# Patient Record
Sex: Female | Born: 1940 | Race: Black or African American | Hispanic: No | Marital: Married | Smoking: Former smoker
Health system: Southern US, Community
[De-identification: ages and names within clinical notes are randomized; demographics above are authoritative.]

## PROBLEM LIST (undated history)

## (undated) DIAGNOSIS — K219 Gastro-esophageal reflux disease without esophagitis: Secondary | ICD-10-CM

## (undated) DIAGNOSIS — E785 Hyperlipidemia, unspecified: Secondary | ICD-10-CM

## (undated) DIAGNOSIS — I10 Essential (primary) hypertension: Secondary | ICD-10-CM

## (undated) DIAGNOSIS — E119 Type 2 diabetes mellitus without complications: Secondary | ICD-10-CM

## (undated) DIAGNOSIS — I639 Cerebral infarction, unspecified: Secondary | ICD-10-CM

## (undated) HISTORY — DX: Essential (primary) hypertension: I10

## (undated) HISTORY — DX: Type 2 diabetes mellitus without complications: E11.9

## (undated) HISTORY — DX: Hyperlipidemia, unspecified: E78.5

## (undated) HISTORY — DX: Gastro-esophageal reflux disease without esophagitis: K21.9

---

## 1984-03-09 HISTORY — PX: CHOLECYSTECTOMY: SHX55

## 1994-06-04 DIAGNOSIS — I10 Essential (primary) hypertension: Secondary | ICD-10-CM | POA: Insufficient documentation

## 2001-03-09 HISTORY — PX: BACK SURGERY: SHX140

## 2003-07-19 ENCOUNTER — Other Ambulatory Visit: Payer: Self-pay

## 2004-04-05 ENCOUNTER — Ambulatory Visit: Payer: Self-pay | Admitting: Internal Medicine

## 2004-04-05 LAB — HM COLONOSCOPY: HM Colonoscopy: NORMAL

## 2004-12-22 ENCOUNTER — Ambulatory Visit: Payer: Self-pay | Admitting: Family Medicine

## 2006-01-07 ENCOUNTER — Ambulatory Visit: Payer: Self-pay | Admitting: Family Medicine

## 2006-01-15 ENCOUNTER — Ambulatory Visit: Payer: Self-pay | Admitting: Family Medicine

## 2007-07-11 ENCOUNTER — Ambulatory Visit: Payer: Self-pay | Admitting: Family Medicine

## 2008-03-09 HISTORY — PX: CERVICAL BIOPSY  W/ LOOP ELECTRODE EXCISION: SUR135

## 2008-09-19 ENCOUNTER — Ambulatory Visit: Payer: Self-pay | Admitting: Family Medicine

## 2009-07-15 ENCOUNTER — Ambulatory Visit: Payer: Self-pay | Admitting: Obstetrics & Gynecology

## 2009-07-16 ENCOUNTER — Ambulatory Visit: Payer: Self-pay | Admitting: Obstetrics & Gynecology

## 2009-07-25 ENCOUNTER — Ambulatory Visit: Payer: Self-pay | Admitting: Obstetrics & Gynecology

## 2009-10-16 ENCOUNTER — Ambulatory Visit: Payer: Self-pay | Admitting: Family Medicine

## 2010-08-17 ENCOUNTER — Emergency Department: Payer: Self-pay | Admitting: Emergency Medicine

## 2010-12-22 ENCOUNTER — Ambulatory Visit: Payer: Self-pay | Admitting: Family Medicine

## 2011-02-25 ENCOUNTER — Ambulatory Visit: Payer: Self-pay | Admitting: Family Medicine

## 2011-03-10 ENCOUNTER — Ambulatory Visit: Payer: Self-pay | Admitting: Family Medicine

## 2011-04-10 ENCOUNTER — Ambulatory Visit: Payer: Self-pay | Admitting: Family Medicine

## 2011-05-08 ENCOUNTER — Ambulatory Visit: Payer: Self-pay | Admitting: Family Medicine

## 2011-12-23 ENCOUNTER — Ambulatory Visit: Payer: Self-pay | Admitting: Family Medicine

## 2012-07-25 ENCOUNTER — Ambulatory Visit: Payer: Self-pay | Admitting: Family Medicine

## 2012-07-25 LAB — HM DEXA SCAN: HM DEXA SCAN: NORMAL

## 2013-01-25 ENCOUNTER — Ambulatory Visit: Payer: Self-pay | Admitting: Family Medicine

## 2013-09-20 LAB — CBC AND DIFFERENTIAL
HEMATOCRIT: 39 % (ref 36–46)
HEMOGLOBIN: 12.5 g/dL (ref 12.0–16.0)
PLATELETS: 171 10*3/uL (ref 150–399)
WBC: 4.8 10*3/mL

## 2013-09-20 LAB — LIPID PANEL
Cholesterol: 161 mg/dL (ref 0–200)
HDL: 71 mg/dL — AB (ref 35–70)
LDL CALC: 161 mg/dL
Triglycerides: 175 mg/dL — AB (ref 40–160)

## 2013-09-20 LAB — TSH: TSH: 4.21 u[IU]/mL (ref 0.41–5.90)

## 2013-09-20 LAB — BASIC METABOLIC PANEL
BUN: 14 mg/dL (ref 4–21)
Creatinine: 0.9 mg/dL (ref 0.5–1.1)
Glucose: 148 mg/dL
Potassium: 5 mmol/L (ref 3.4–5.3)
SODIUM: 142 mmol/L (ref 137–147)

## 2013-09-20 LAB — HEPATIC FUNCTION PANEL
ALT: 30 U/L (ref 7–35)
AST: 33 U/L (ref 13–35)

## 2013-10-08 ENCOUNTER — Emergency Department: Payer: Self-pay | Admitting: Emergency Medicine

## 2013-10-08 LAB — URINALYSIS, COMPLETE
BILIRUBIN, UR: NEGATIVE
Bacteria: NONE SEEN
Blood: NEGATIVE
Glucose,UR: NEGATIVE mg/dL (ref 0–75)
KETONE: NEGATIVE
Nitrite: NEGATIVE
Ph: 5 (ref 4.5–8.0)
Protein: NEGATIVE
Specific Gravity: 1.02 (ref 1.003–1.030)
Squamous Epithelial: 2
WBC UR: 6 /HPF (ref 0–5)

## 2013-10-08 LAB — COMPREHENSIVE METABOLIC PANEL
ALBUMIN: 3.5 g/dL (ref 3.4–5.0)
ANION GAP: 9 (ref 7–16)
Alkaline Phosphatase: 47 U/L
BUN: 11 mg/dL (ref 7–18)
Bilirubin,Total: 0.5 mg/dL (ref 0.2–1.0)
CHLORIDE: 100 mmol/L (ref 98–107)
CO2: 27 mmol/L (ref 21–32)
Calcium, Total: 9.2 mg/dL (ref 8.5–10.1)
Creatinine: 0.96 mg/dL (ref 0.60–1.30)
EGFR (African American): >60
GFR CALC NON AF AMER: 60 — AB
Glucose: 176 mg/dL — ABNORMAL HIGH (ref 65–99)
Osmolality: 276 (ref 275–301)
Potassium: 3.8 mmol/L (ref 3.5–5.1)
SGOT(AST): 28 U/L (ref 15–37)
SGPT (ALT): 36 U/L
Sodium: 136 mmol/L (ref 136–145)
Total Protein: 8 g/dL (ref 6.4–8.2)

## 2013-10-08 LAB — CBC WITH DIFFERENTIAL/PLATELET
Basophil #: 0 10*3/uL (ref 0.0–0.1)
Basophil %: 0.4 %
Eosinophil #: 0.1 10*3/uL (ref 0.0–0.7)
Eosinophil %: 1.3 %
HCT: 38.1 % (ref 35.0–47.0)
HGB: 12.3 g/dL (ref 12.0–16.0)
LYMPHS ABS: 1 10*3/uL (ref 1.0–3.6)
Lymphocyte %: 15.5 %
MCH: 29.3 pg (ref 26.0–34.0)
MCHC: 32.4 g/dL (ref 32.0–36.0)
MCV: 91 fL (ref 80–100)
MONO ABS: 1 x10 3/mm — AB (ref 0.2–0.9)
MONOS PCT: 15.4 %
NEUTROS ABS: 4.2 10*3/uL (ref 1.4–6.5)
Neutrophil %: 67.4 %
Platelet: 166 10*3/uL (ref 150–440)
RBC: 4.21 10*6/uL (ref 3.80–5.20)
RDW: 12.5 % (ref 11.5–14.5)
WBC: 6.2 10*3/uL (ref 3.6–11.0)

## 2013-10-08 LAB — PRO B NATRIURETIC PEPTIDE: B-TYPE NATIURETIC PEPTID: 292 pg/mL — AB (ref 0–125)

## 2013-10-08 LAB — TROPONIN I: Troponin-I: <0.02 ng/mL

## 2014-02-06 ENCOUNTER — Ambulatory Visit: Payer: Self-pay

## 2014-03-14 ENCOUNTER — Ambulatory Visit: Payer: Self-pay | Admitting: Family Medicine

## 2014-03-14 LAB — HM MAMMOGRAPHY

## 2014-05-23 LAB — HEMOGLOBIN A1C: HEMOGLOBIN A1C: 6.6 % — AB (ref 4.0–6.0)

## 2014-07-18 DIAGNOSIS — E559 Vitamin D deficiency, unspecified: Secondary | ICD-10-CM | POA: Insufficient documentation

## 2014-07-18 DIAGNOSIS — R6889 Other general symptoms and signs: Secondary | ICD-10-CM | POA: Insufficient documentation

## 2014-07-18 DIAGNOSIS — K219 Gastro-esophageal reflux disease without esophagitis: Secondary | ICD-10-CM | POA: Insufficient documentation

## 2014-07-18 DIAGNOSIS — J309 Allergic rhinitis, unspecified: Secondary | ICD-10-CM | POA: Insufficient documentation

## 2014-07-18 DIAGNOSIS — E538 Deficiency of other specified B group vitamins: Secondary | ICD-10-CM | POA: Insufficient documentation

## 2014-07-18 DIAGNOSIS — E7800 Pure hypercholesterolemia, unspecified: Secondary | ICD-10-CM | POA: Insufficient documentation

## 2014-07-18 DIAGNOSIS — IMO0002 Reserved for concepts with insufficient information to code with codable children: Secondary | ICD-10-CM | POA: Insufficient documentation

## 2014-07-18 DIAGNOSIS — K635 Polyp of colon: Secondary | ICD-10-CM | POA: Insufficient documentation

## 2014-08-20 ENCOUNTER — Other Ambulatory Visit: Payer: Self-pay

## 2014-08-20 DIAGNOSIS — E119 Type 2 diabetes mellitus without complications: Secondary | ICD-10-CM

## 2014-08-20 MED ORDER — TRUE METRIX AIR GLUCOSE METER DEVI: 1.0000 | Freq: Every day | Status: AC

## 2014-08-20 MED ORDER — TRUEPLUS LANCETS 28G MISC: Status: AC

## 2014-08-20 MED ORDER — GLUCOSE BLOOD VI STRP: ORAL_STRIP | Status: DC

## 2014-09-07 ENCOUNTER — Encounter: Payer: Self-pay | Admitting: Physician Assistant

## 2014-09-07 ENCOUNTER — Ambulatory Visit (INDEPENDENT_AMBULATORY_CARE_PROVIDER_SITE_OTHER): Payer: Medicare PPO | Admitting: Physician Assistant

## 2014-09-07 VITALS — BP 122/64 | HR 58 | Temp 98.5°F | Resp 16 | Ht 67.0 in | Wt 222.0 lb

## 2014-09-07 DIAGNOSIS — N3000 Acute cystitis without hematuria: Secondary | ICD-10-CM | POA: Diagnosis not present

## 2014-09-07 LAB — POCT URINALYSIS DIPSTICK
BILIRUBIN UA: NEGATIVE
Blood, UA: NEGATIVE
Glucose, UA: NEGATIVE
KETONES UA: NEGATIVE
Nitrite, UA: NEGATIVE
PROTEIN UA: NEGATIVE
SPEC GRAV UA: 1.015
Urobilinogen, UA: 0.2
pH, UA: 6

## 2014-09-07 NOTE — Patient Instructions (Signed)

## 2014-09-07 NOTE — Progress Notes (Signed)
Patient ID: Kathleen Campos, female   DOB: 1940-04-02, 74 y.o.   MRN: [id number]       [name] Female    DOB: 01/08/1941   74 y.o.   MRN: [id number] Visit Date: 09/07/2014  Today's Provider: Mar Daring, PA-C   Chief Complaint  Patient presents with  . Follow-up     after been seen at Urgent Care   Subjective:    HPI Patient was seen at Urgent Care on JUne 22nd for UTI. She was started on Cephalexin at that time which she took but then this week that doctor called and advised patient to switch to Septra DS and she has been taking it for 2 days now. She was advised to have her potassium level checked on this medication. She is not sure why but wanted to follow up on this today unless it is too early to have this re checked. She feels a little better today she still has some nausea and some lower abdomen pressure. No fever. No vomiting.   Previous Medications   ASPIRIN 81 MG TABLET    ASPIRIN, 81MG  (Oral Tablet Delayed Release)  1 Every Day for 0 days  Quantity: 0.00;  Refills: 0   Ordered :10-Dec-2011  Edmonia James ;  Started 04-October-2007 Active   BLOOD GLUCOSE MONITORING SUPPL (TRUE METRIX AIR GLUCOSE METER) DEVI    1 Device by Does not apply route daily. To check blood sugar once a day.   CALCIUM-VITAMIN D (OSCAL WITH D) 500-200 MG-UNIT PER TABLET    OSCAL 500/200 D-3, 500-200MG -UNIT (Oral Tablet)  1 Twice Daily for 0 days  Quantity: 0.00;  Refills: 0   Ordered :10-Dec-2011  Edmonia James ;  Started 04-October-2007 Active   CHOLECALCIFEROL 1000 UNITS CAPSULE    Take by mouth.   CYANOCOBALAMIN 1000 MCG TABLET    Take by mouth.   GLIPIZIDE (GLUCOTROL XL) 10 MG 24 HR TABLET    Take by mouth.   GLUCOSE BLOOD TEST STRIP    To check blood sugar once a day.   HYDROCHLOROTHIAZIDE (HYDRODIURIL) 12.5 MG TABLET    Take by mouth.   METFORMIN (GLUCOPHAGE) 1000 MG TABLET    Take by mouth.   NAPROXEN SODIUM (ANAPROX) 220 MG TABLET    Take by mouth.   OMEPRAZOLE  (PRILOSEC) 20 MG CAPSULE    Take by mouth.   PIOGLITAZONE (ACTOS) 30 MG TABLET    Take by mouth.   POTASSIUM CHLORIDE SA (K-DUR,KLOR-CON) 20 MEQ TABLET    Take by mouth.   SIMVASTATIN (ZOCOR) 20 MG TABLET    Take by mouth.   SULFAMETHOXAZOLE-TRIMETHOPRIM (BACTRIM DS,SEPTRA DS) 800-160 MG PER TABLET    Take by mouth.   TRUEPLUS LANCETS 28G MISC    To check blood sugar once a day.   VALSARTAN (DIOVAN) 80 MG TABLET    Take by mouth.    Review of Systems  Constitutional: Negative for fever, chills and fatigue.  Respiratory: Negative for apnea, cough, choking, chest tightness and shortness of breath.   Cardiovascular: Negative for chest pain, palpitations and leg swelling.  Gastrointestinal: Positive for nausea and abdominal pain (lower abdomen). Negative for diarrhea and constipation.  Musculoskeletal: Positive for back pain (sometimes).    History  Substance Use Topics  . Smoking status: Former Smoker    Quit date: 07/18/1978  . Smokeless tobacco: Never Used  . Alcohol Use: No   Objective:   BP 122/64 mmHg  Pulse 58  Temp(Src) 98.5  F (36.9 C)  Resp 16  Ht 5\' 7"  (1.702 m)  Wt 222 lb (100.699 kg)  BMI 34.76 kg/m2  Physical Exam  Constitutional: She appears well-developed and well-nourished. No distress.  Abdominal: Soft. Bowel sounds are normal. She exhibits no distension and no mass. There is tenderness in the suprapubic area. There is no rebound, no guarding and no CVA tenderness.  Skin: Skin is warm and dry. She is not diaphoretic.  Vitals reviewed.       Assessment & Plan:     1. Acute cystitis without hematuria Will recheck urine for clearing as she is still having some lower abdominal discomfort.  Continue Septra-DS as prescribed by Urgent Care.  F/U if symptoms do not improve. - POCT Urinalysis Dipstick   Follow up: No Follow-up on file.      Mar Daring, PA-C  [facility]

## 2014-09-19 ENCOUNTER — Encounter: Payer: Self-pay | Admitting: Family Medicine

## 2014-09-19 ENCOUNTER — Other Ambulatory Visit: Payer: Self-pay | Admitting: Family Medicine

## 2014-09-19 ENCOUNTER — Ambulatory Visit (INDEPENDENT_AMBULATORY_CARE_PROVIDER_SITE_OTHER): Payer: Medicare PPO | Admitting: Family Medicine

## 2014-09-19 VITALS — BP 120/70 | HR 64 | Temp 97.8°F | Resp 16 | Ht 64.0 in | Wt 222.0 lb

## 2014-09-19 DIAGNOSIS — I10 Essential (primary) hypertension: Secondary | ICD-10-CM | POA: Diagnosis not present

## 2014-09-19 DIAGNOSIS — E7800 Pure hypercholesterolemia, unspecified: Secondary | ICD-10-CM

## 2014-09-19 DIAGNOSIS — E119 Type 2 diabetes mellitus without complications: Secondary | ICD-10-CM

## 2014-09-19 DIAGNOSIS — E668 Other obesity: Secondary | ICD-10-CM

## 2014-09-19 DIAGNOSIS — IMO0002 Reserved for concepts with insufficient information to code with codable children: Secondary | ICD-10-CM

## 2014-09-19 LAB — POCT GLYCOSYLATED HEMOGLOBIN (HGB A1C): HEMOGLOBIN A1C: 6.9

## 2014-09-19 MED ORDER — GLIPIZIDE ER 5 MG PO TB24: 5.0000 mg | ORAL_TABLET | Freq: Every day | ORAL | Status: DC

## 2014-09-19 NOTE — Progress Notes (Signed)
Subjective:    Patient ID: [name], female    DOB: 09-20-1940, 74 y.o.   MRN: [id number]  HPI Diabetes Mellitus Type II, Follow-up: Patient here for follow-up of Type 2 diabetes mellitus.  Current symptoms/problems include none and have been stable.  Eye exam current (within one year): yes Weight trend: stable Prior visit with dietician: no Current diet: in general, a "healthy" diet   Current exercise: stays active at work  Current monitoring regimen: home blood tests - daily Home blood sugar records: fasting range: 90's-120's Any episodes of hypoglycemia? yes - twice a week "90", symptoms-shaky, nausea, and visual gets blurry.  Is ok to have [facility] come out and talk to her. Is going to stop checking daily and save strips for when low. Knows does not always eat right.    Results for orders placed or performed in visit on 09/19/14  POCT glycosylated hemoglobin (Hb A1C)  Result Value Ref Range   Hemoglobin A1C 6.9    Lab Results  Component Value Date   HGBA1C 6.9 09/19/2014     Review of Systems  Respiratory: Negative.   Cardiovascular: Negative.   Gastrointestinal: Negative.   Endocrine: Negative.     Patient Active Problem List   Diagnosis Date Noted  . Nonspecific abnormal finding 07/18/2014  . Allergic rhinitis 07/18/2014  . Colon polyp 07/18/2014  . Acid reflux 07/18/2014  . Hypercholesteremia 07/18/2014  . Low serum cobalamin 07/18/2014  . Adult BMI 30+ 07/18/2014  . Avitaminosis D 07/18/2014  . Diabetes mellitus, type 2 09/09/1998  . AG (atrophic gastritis) 03/06/1998  . BP (high blood pressure) 06/04/1994   Past Medical History  Diagnosis Date  . GERD (gastroesophageal reflux disease)   . Diabetes mellitus without complication   . Hyperlipidemia   . Hypertension    Current Outpatient Prescriptions on File Prior to Visit  Medication Sig  . aspirin 81 MG tablet ASPIRIN, 81MG  (Oral Tablet Delayed Release)  1 Every Day for 0 days  Quantity:  0.00;  Refills: 0   Ordered :10-Dec-2011  Edmonia James ;  Started 04-October-2007 Active  . Blood Glucose Monitoring Suppl (TRUE METRIX AIR GLUCOSE METER) DEVI 1 Device by Does not apply route daily. To check blood sugar once a day.  . calcium-vitamin D (OSCAL WITH D) 500-200 MG-UNIT per tablet OSCAL 500/200 D-3, 500-200MG -UNIT (Oral Tablet)  1 Twice Daily for 0 days  Quantity: 0.00;  Refills: 0   Ordered :10-Dec-2011  Edmonia James ;  Started 04-October-2007 Active  . Cholecalciferol 1000 UNITS capsule Take by mouth.  . cyanocobalamin 1000 MCG tablet Take by mouth.  Marland Kitchen glipiZIDE (GLUCOTROL XL) 10 MG 24 hr tablet Take by mouth.  Marland Kitchen glucose blood test strip To check blood sugar once a day.  . hydrochlorothiazide (HYDRODIURIL) 12.5 MG tablet Take by mouth.  . metFORMIN (GLUCOPHAGE) 1000 MG tablet Take by mouth.  . naproxen sodium (ANAPROX) 220 MG tablet Take by mouth.  Marland Kitchen omeprazole (PRILOSEC) 20 MG capsule Take by mouth.  . pioglitazone (ACTOS) 30 MG tablet Take by mouth.  . potassium chloride SA (K-DUR,KLOR-CON) 20 MEQ tablet Take by mouth.  . simvastatin (ZOCOR) 20 MG tablet Take by mouth.  . TRUEPLUS LANCETS 28G MISC To check blood sugar once a day.  . valsartan (DIOVAN) 80 MG tablet Take by mouth.   No current facility-administered medications on file prior to visit.   Allergies  Allergen Reactions  . Clarithromycin   . Codeine Nausea And Vomiting  . Lisinopril  Cough  . Morphine   . Ciprofloxacin Rash    nausea & itching.   Past Surgical History  Procedure Laterality Date  . Cervical biopsy  w/ loop electrode excision  2010  . Cholecystectomy  1986  . Back surgery  2003   History   Social History  . Marital Status: Married    Spouse Name: Kyung Rudd  . Number of Children: 3  . Years of Education: 12   Occupational History  . private caregiver    Social History Main Topics  . Smoking status: Former Smoker    Quit date: 07/18/1978  . Smokeless tobacco: Never Used  .  Alcohol Use: No  . Drug Use: No  . Sexual Activity: Not on file   Other Topics Concern  . Not on file   Social History Narrative   Family History  Problem Relation Age of Onset  . Hypertension Mother   . Heart disease Mother   . Lung cancer Father   . Heart disease Sister   . Hypertension Sister   . Hypertension Brother   . Diabetes Brother         Objective:   Physical Exam  Constitutional: She is oriented to person, place, and time. She appears well-developed and well-nourished.  Neurological: She is alert and oriented to person, place, and time.  Psychiatric: She has a normal mood and affect. Her behavior is normal. Judgment and thought content normal.    Diabetic Foot Exam - Simple   Simple Foot Form  Diabetic Foot exam was performed with the following findings:  Yes 09/19/2014  8:50 AM  Visual Inspection  No deformities, no ulcerations, no other skin breakdown bilaterally:  Yes  Sensation Testing  Intact to touch and monofilament testing bilaterally:  Yes  Pulse Check  Posterior Tibialis and Dorsalis pulse intact bilaterally:  Yes  Comments       BP 120/70 mmHg  Pulse 64  Temp(Src) 97.8 F (36.6 C) (Oral)  Resp 16  Ht 5\' 4"  (1.626 m)  Wt 222 lb (100.699 kg)  BMI 38.09 kg/m2  SpO2 100%       Assessment & Plan:    1. Type 2 diabetes mellitus without complication Stable Is having some lows, may be causing her to overcorrect. Will decrease Glipizide, adjust diet and recheck in 3 months.  Check when has lows and try to figure out pattern.  No need to check every am, save some strips for lows.  - POCT glycosylated hemoglobin (Hb A1C) - AMB Referral to [place] Management - glipiZIDE (GLIPIZIDE XL) 5 MG 24 hr tablet; Take 1 tablet (5 mg total) by mouth daily with breakfast.  Dispense: 90 tablet; Refill: 3  2. Essential hypertension Stable. Continue medication. Refill as needed.  - Comprehensive metabolic panel - CBC with Differential/Platelet  3.  Hypercholesteremia Time to recheck labs.  Will order today.  - Lipid panel  4. Adult BMI 30+ Will work on eating healthy and exercise. Make work with [facility]  [facility] Management to improve sugars.   Margarita Rana, MD

## 2014-09-20 LAB — CBC WITH DIFFERENTIAL/PLATELET
BASOS ABS: 0 10*3/uL (ref 0.0–0.2)
Basos: 1 %
EOS (ABSOLUTE): 0.1 10*3/uL (ref 0.0–0.4)
Eos: 2 %
Hematocrit: 37.4 % (ref 34.0–46.6)
Hemoglobin: 12.5 g/dL (ref 11.1–15.9)
IMMATURE GRANS (ABS): 0 10*3/uL (ref 0.0–0.1)
Immature Granulocytes: 0 %
Lymphocytes Absolute: 1.5 10*3/uL (ref 0.7–3.1)
Lymphs: 34 %
MCH: 30 pg (ref 26.6–33.0)
MCHC: 33.4 g/dL (ref 31.5–35.7)
MCV: 90 fL (ref 79–97)
Monocytes Absolute: 0.4 10*3/uL (ref 0.1–0.9)
Monocytes: 10 %
NEUTROS ABS: 2.3 10*3/uL (ref 1.4–7.0)
NEUTROS PCT: 53 %
PLATELETS: 150 10*3/uL (ref 150–379)
RBC: 4.16 x10E6/uL (ref 3.77–5.28)
RDW: 13.5 % (ref 12.3–15.4)
WBC: 4.4 10*3/uL (ref 3.4–10.8)

## 2014-09-20 LAB — LIPID PANEL
CHOLESTEROL TOTAL: 152 mg/dL (ref 100–199)
Chol/HDL Ratio: 2.4 ratio units (ref 0.0–4.4)
HDL: 64 mg/dL (ref >39)
LDL CALC: 68 mg/dL (ref 0–99)
TRIGLYCERIDES: 101 mg/dL (ref 0–149)
VLDL CHOLESTEROL CAL: 20 mg/dL (ref 5–40)

## 2014-09-20 LAB — COMPREHENSIVE METABOLIC PANEL: CO2: 17 mmol/L — ABNORMAL LOW (ref 18–29)

## 2014-09-24 ENCOUNTER — Telehealth: Payer: Self-pay

## 2014-09-24 NOTE — Telephone Encounter (Signed)
Advised pt of lab results. Pt verbally acknowledges understanding. Emily Drozdowski, CMA   

## 2014-09-24 NOTE — Telephone Encounter (Signed)
-----   Message from Margarita Rana, MD sent at 09/22/2014  3:50 PM EDT ----- Labs stable. Please notify patient. Thanks.

## 2014-10-08 ENCOUNTER — Other Ambulatory Visit: Payer: Self-pay | Admitting: Family Medicine

## 2014-10-08 DIAGNOSIS — I10 Essential (primary) hypertension: Secondary | ICD-10-CM

## 2014-10-09 NOTE — Patient Outreach (Signed)
[facility]) Care Management  10/09/2014  Kathleen Campos 1940-05-29 [id number]   Deloria Lair, NP assigned to engage patient for [place] Management services.  [name]. [place], [place] Management [place] Assistant Phone: [phone] Fax: [phone]

## 2014-10-11 ENCOUNTER — Other Ambulatory Visit: Payer: Self-pay | Admitting: *Deleted

## 2014-10-12 ENCOUNTER — Encounter: Payer: Self-pay | Admitting: *Deleted

## 2014-10-12 ENCOUNTER — Other Ambulatory Visit: Payer: Self-pay | Admitting: *Deleted

## 2014-10-12 NOTE — Patient Outreach (Signed)
Initial outreach to pt who was referred by her primary care provider for more diabetic teaching. Provider was concerned about some high and low glucose levels. Pt glucose today was 112 fasting. Her last Hgb A1C was 6._. She reads labels and tries to be conscientious. She has been to a diabetic class.  Pt is still working at age 74. 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 This probably contributes to some of the erratic glucose readings.  She declined in home case management but has agreed to telephonic.  I will refer her back to our office liaison for telephonic care manager assignment.  Deloria Lair [facility] [phone]

## 2014-10-16 ENCOUNTER — Encounter: Payer: Self-pay | Admitting: Family Medicine

## 2014-10-16 ENCOUNTER — Ambulatory Visit (INDEPENDENT_AMBULATORY_CARE_PROVIDER_SITE_OTHER): Payer: Medicare PPO | Admitting: Family Medicine

## 2014-10-16 ENCOUNTER — Other Ambulatory Visit: Payer: Self-pay | Admitting: Family Medicine

## 2014-10-16 VITALS — BP 126/64 | HR 70 | Temp 98.0°F | Resp 16 | Wt 227.8 lb

## 2014-10-16 DIAGNOSIS — R110 Nausea: Secondary | ICD-10-CM

## 2014-10-16 DIAGNOSIS — N3091 Cystitis, unspecified with hematuria: Secondary | ICD-10-CM

## 2014-10-16 LAB — POCT URINALYSIS DIPSTICK
BILIRUBIN UA: NEGATIVE
Glucose, UA: NEGATIVE
Nitrite, UA: NEGATIVE
Protein, UA: NEGATIVE
Spec Grav, UA: 1.015
Urobilinogen, UA: 0.2
pH, UA: 5

## 2014-10-16 MED ORDER — ONDANSETRON 8 MG PO TBDP: 8.0000 mg | ORAL_TABLET | Freq: Three times a day (TID) | ORAL | Status: DC | PRN

## 2014-10-16 MED ORDER — SULFAMETHOXAZOLE-TRIMETHOPRIM 800-160 MG PO TABS: 1.0000 | ORAL_TABLET | Freq: Two times a day (BID) | ORAL | Status: DC

## 2014-10-16 NOTE — Patient Instructions (Signed)
We will call you with the lab results. 

## 2014-10-16 NOTE — Progress Notes (Signed)
Subjective:     Patient ID: Kathleen Campos, female   DOB: 15-Mar-1940, 74 y.o.   MRN: [id number]  HPI  Chief Complaint  Patient presents with  . Urinary Frequency    Patient comes in office today with concerns of UTI symptoms. Patient states that she had pain in her lower abdomen for a dew days and yesterday she began having burning with urination and frequency  States she was treated by the ER in June with Keflex for a UTI.   Review of Systems  Constitutional: Negative for fever and chills.  Gastrointestinal: Positive for nausea.       Objective:   Physical Exam  Constitutional: She appears well-developed and well-nourished. No distress.  Genitourinary:  No cva tenderness       Assessment:    1. Cystitis with hematuria - POCT urinalysis dipstick - Urine culture - sulfamethoxazole-trimethoprim (BACTRIM DS,SEPTRA DS) 800-160 MG per tablet; Take 1 tablet by mouth 2 (two) times daily.  Dispense: 14 tablet; Refill: 0  2. Nausea - ondansetron (ZOFRAN ODT) 8 MG disintegrating tablet; Take 1 tablet (8 mg total) by mouth every 8 (eight) hours as needed for nausea or vomiting.  Dispense: 6 tablet; Refill: 0    Plan:    Pending urine culture results. Encouraged increased fluid intake.

## 2014-10-18 ENCOUNTER — Telehealth: Payer: Self-pay

## 2014-10-18 LAB — URINE CULTURE

## 2014-10-18 NOTE — Telephone Encounter (Signed)
Advised pt as directed below, pt verbalized fully understanding.   Thanks,   

## 2014-10-18 NOTE — Telephone Encounter (Signed)
Left message to call back,  Thanks,

## 2014-10-18 NOTE — Telephone Encounter (Signed)
-----   Message from Carmon Ginsberg, [place] sent at 10/18/2014  7:47 AM EDT ----- No organism detected. May stop antibiotic.

## 2014-10-19 ENCOUNTER — Other Ambulatory Visit: Payer: Medicare PPO | Admitting: *Deleted

## 2014-10-19 NOTE — Patient Outreach (Signed)
Follow up phone call to pt. She is pending start up of telephonic case management for diabetes. I noted in the chart that the pt had an office visit for sxs of UTI this week and she was started on an antibiotic. Today she reports that the MD office called and told her her urine was clean and that she should stop the antibiotic. I praised her for seeking help early when she thought there was a problem and reinforced the importance of that is patients with diabetes.  I advised her my co-worker Quinn Plowman, RN, will be following her for telephonic case management and coaching on her diabetes self management.   Deloria Lair [facility] [phone]

## 2014-11-01 NOTE — Patient Outreach (Signed)
[facility]) Care Management  11/01/2014  Kathleen Campos Feb 22, 1941 [id number]   Request from Quinn Plowman, RNCM to assigned health coach. Kathleen Shock, RN assigned for patient outreach.  Hailei Besser L. Damauri Minion, [place] Care Management Assistant

## 2014-11-06 ENCOUNTER — Other Ambulatory Visit: Payer: Self-pay | Admitting: *Deleted

## 2014-11-06 NOTE — Patient Outreach (Signed)
[facility]) Care Management  11/06/2014  Kathleen Campos 01/20/1941 [id number]  RN CM attempted  Follow up outreach call to patient.  Patient was unavailable. HIPPA compliance voicemail message was left with return callback number.    [place] Care Management [phone]

## 2014-11-13 ENCOUNTER — Other Ambulatory Visit: Payer: Self-pay | Admitting: *Deleted

## 2014-11-13 NOTE — Patient Outreach (Signed)
[facility]) Care Management  11/13/2014  Kathleen Campos October 09, 1940 [id number]   RN CM attempted Telephone outreach call to patient.  Patient was unavailable. HIPPA compliance voicemail message was left with return callback number.    [place] Care Management [phone]

## 2014-11-20 ENCOUNTER — Other Ambulatory Visit: Payer: Self-pay | Admitting: *Deleted

## 2014-11-20 NOTE — Patient Outreach (Signed)
[facility]) Care Management  11/20/2014  Kathleen Campos [birth year] [id number]   RN CM attempted 3rd  Telephone outreach call to patient.  Patient was unavailable. HIPPA compliance voicemail message was left with return callback number.    [place] Care Management [phone]

## 2014-12-04 ENCOUNTER — Encounter: Payer: Self-pay | Admitting: *Deleted

## 2014-12-05 NOTE — Patient Outreach (Signed)
[facility]) Care Management  12/05/2014  Kathleen Campos 09/05/1940 [id number]   Notification from [name], RN to close case due to unable to contact patient for [place] Management services.  Thanks, [name]. [place], [place] Assistant Phone: [phone] Fax: [phone]

## 2014-12-12 ENCOUNTER — Other Ambulatory Visit: Payer: Self-pay | Admitting: Family Medicine

## 2014-12-12 DIAGNOSIS — E119 Type 2 diabetes mellitus without complications: Secondary | ICD-10-CM

## 2014-12-12 DIAGNOSIS — E7800 Pure hypercholesterolemia, unspecified: Secondary | ICD-10-CM

## 2014-12-21 ENCOUNTER — Encounter: Payer: Self-pay | Admitting: Family Medicine

## 2014-12-21 ENCOUNTER — Ambulatory Visit (INDEPENDENT_AMBULATORY_CARE_PROVIDER_SITE_OTHER): Payer: Medicare PPO | Admitting: Family Medicine

## 2014-12-21 VITALS — BP 120/78 | HR 60 | Temp 98.1°F | Resp 16 | Ht 67.0 in | Wt 222.0 lb

## 2014-12-21 DIAGNOSIS — H6093 Unspecified otitis externa, bilateral: Secondary | ICD-10-CM

## 2014-12-21 DIAGNOSIS — E119 Type 2 diabetes mellitus without complications: Secondary | ICD-10-CM

## 2014-12-21 DIAGNOSIS — I10 Essential (primary) hypertension: Secondary | ICD-10-CM | POA: Diagnosis not present

## 2014-12-21 DIAGNOSIS — H6090 Unspecified otitis externa, unspecified ear: Secondary | ICD-10-CM | POA: Insufficient documentation

## 2014-12-21 MED ORDER — NEOMYCIN-POLYMYXIN-HC 1 % OT SOLN: 3.0000 [drp] | Freq: Four times a day (QID) | OTIC | Status: DC

## 2014-12-21 NOTE — Progress Notes (Signed)
Patient ID: Kathleen Campos, female   DOB: 1940-11-19, 74 y.o.   MRN: [id number]        [name] Female    DOB: Sep 27, 1940   74 y.o.   MRN: [id number] Visit Date: 12/21/2014  Today's Provider: Margarita Rana, MD   Chief Complaint  Patient presents with  . Ear Pain   Subjective:    HPI  Ear Pain: Patient presents with bilateral ear pain.  Symptoms include plugged sensation in both ears. Symptoms began 1 week ago and are gradually improving since that time. Patient denies fever. Ear history: 1 previous ear infections.  Hurts to open her mouth on left and itchy on the right. No fevers or congestion.  Worse this am. Even sore under her jaw. Going to family reunion tomorrow and did not want it getting worse.      Blood pressure has been stable. No headaches. Blood sugars have also been stable.  No signs of infection.  Taking all of her medication without difficulty.     Allergies  Allergen Reactions  . Clarithromycin   . Codeine Nausea And Vomiting  . Lisinopril Cough  . Morphine   . Ciprofloxacin Rash    nausea & itching.   Previous Medications   ASPIRIN 81 MG TABLET    ASPIRIN, 81MG  (Oral Tablet Delayed Release)  1 Every Day for 0 days  Quantity: 0.00;  Refills: 0   Ordered :10-Dec-2011  Kathleen Campos ;  Started 04-October-2007 Active   BLOOD GLUCOSE MONITORING SUPPL (TRUE METRIX AIR GLUCOSE METER) DEVI    1 Device by Does not apply route daily. To check blood sugar once a day.   CALCIUM-VITAMIN D (OSCAL WITH D) 500-200 MG-UNIT PER TABLET    OSCAL 500/200 D-3, 500-200MG -UNIT (Oral Tablet)  1 Twice Daily for 0 days  Quantity: 0.00;  Refills: 0   Ordered :10-Dec-2011  Kathleen Campos ;  Started 04-October-2007 Active   CHOLECALCIFEROL 1000 UNITS CAPSULE    Take by mouth.   CYANOCOBALAMIN 1000 MCG TABLET    Take by mouth.   GLIPIZIDE (GLIPIZIDE XL) 5 MG 24 HR TABLET    Take 1 tablet (5 mg total) by mouth daily with breakfast.   GLUCOSE BLOOD TEST STRIP    To  check blood sugar once a day.   HYDROCHLOROTHIAZIDE (HYDRODIURIL) 12.5 MG TABLET    Take by mouth.   METFORMIN (GLUCOPHAGE) 1000 MG TABLET    TAKE 1 TABLET TWICE DAILY   NAPROXEN SODIUM (ANAPROX) 220 MG TABLET    Take by mouth.   NITROFURANTOIN, MACROCRYSTAL-MONOHYDRATE, (MACROBID) 100 MG CAPSULE       OMEPRAZOLE (PRILOSEC) 20 MG CAPSULE    Take by mouth.   ONDANSETRON (ZOFRAN ODT) 8 MG DISINTEGRATING TABLET    Take 1 tablet (8 mg total) by mouth every 8 (eight) hours as needed for nausea or vomiting.   PIOGLITAZONE (ACTOS) 30 MG TABLET    Take by mouth.   POTASSIUM CHLORIDE SA (K-DUR,KLOR-CON) 20 MEQ TABLET    Take by mouth.   SIMVASTATIN (ZOCOR) 20 MG TABLET    TAKE 1 TABLET AT BEDTIME   TIMOLOL (TIMOPTIC) 0.5 % OPHTHALMIC SOLUTION       TRUEPLUS LANCETS 28G MISC    To check blood sugar once a day.   VALSARTAN (DIOVAN) 80 MG TABLET    TAKE 1 TABLET EVERYDAY    Review of Systems  Constitutional: Negative.   HENT: Positive for ear pain.   Respiratory: Negative.  Social History  Substance Use Topics  . Smoking status: Former Smoker    Quit date: 07/18/1978  . Smokeless tobacco: Never Used  . Alcohol Use: No   Objective:   There were no vitals taken for this visit.  Physical Exam  Constitutional: She is oriented to person, place, and time. She appears well-developed and well-nourished.  HENT:  Head: Normocephalic and atraumatic.  Right Ear: External ear normal.  Left Ear: External ear normal.  Mouth/Throat: Oropharynx is clear and moist.  Small discomfort with pulling on left pinna. Mild erythema in canal.    Eyes: Conjunctivae and EOM are normal. Pupils are equal, round, and reactive to light.  Neck: Normal range of motion. Neck supple.  Cardiovascular: Normal rate and regular rhythm.   Pulmonary/Chest: Effort normal and breath sounds normal.  Neurological: She is alert and oriented to person, place, and time.  Psychiatric: She has a normal mood and affect. Her behavior  is normal. Judgment and thought content normal.   BP 120/78 mmHg  Pulse 60  Temp(Src) 98.1 F (36.7 C) (Oral)  Resp 16  Ht 5\' 7"  (1.702 m)  Wt 222 lb (100.699 kg)  BMI 34.76 kg/m2  SpO2 100%      Assessment & Plan:        1. Otitis externa, bilateral New problem. Will treat.  Patient instructed to call back if condition worsens or does not improve.    - NEOMYCIN-POLYMYXIN-HYDROCORTISONE (CORTISPORIN) 1 % SOLN otic solution; Place 3 drops into both ears every 6 (six) hours.  Dispense: 10 mL; Refill: 0  2. Essential hypertension Stable. Continue medication.    3. Type 2 diabetes mellitus without complication, without long-term current use of insulin (HCC) Stable, not elevated.  No signs of infection.   Margarita Rana, MD    Margarita Rana, MD  [facility]

## 2014-12-24 ENCOUNTER — Ambulatory Visit: Payer: Medicare PPO | Admitting: Family Medicine

## 2015-01-07 ENCOUNTER — Telehealth: Payer: Self-pay | Admitting: Family Medicine

## 2015-01-07 DIAGNOSIS — Z1211 Encounter for screening for malignant neoplasm of colon: Secondary | ICD-10-CM

## 2015-01-07 NOTE — Telephone Encounter (Signed)
I'm not seeing where there was an order, do you know anything about this?  Thanks,   -Mickel Baas

## 2015-01-07 NOTE — Telephone Encounter (Signed)
Is this cologaurd. No idea how to do this? Thanks.

## 2015-01-07 NOTE — Telephone Encounter (Signed)
Pt called saying she was suppose to get a stool card kit through the mail.  She has not received one and wants to know if she needs to make an appt.  Her call back is   [phone]  Thanks teri

## 2015-01-23 ENCOUNTER — Ambulatory Visit: Payer: Medicare PPO | Admitting: Family Medicine

## 2015-02-06 ENCOUNTER — Other Ambulatory Visit: Payer: Self-pay | Admitting: Family Medicine

## 2015-02-06 DIAGNOSIS — E876 Hypokalemia: Secondary | ICD-10-CM

## 2015-02-15 ENCOUNTER — Encounter: Payer: Self-pay | Admitting: Family Medicine

## 2015-02-15 ENCOUNTER — Ambulatory Visit (INDEPENDENT_AMBULATORY_CARE_PROVIDER_SITE_OTHER): Payer: Medicare PPO | Admitting: Family Medicine

## 2015-02-15 VITALS — BP 124/62 | HR 64 | Temp 97.7°F | Resp 16 | Wt 221.0 lb

## 2015-02-15 DIAGNOSIS — Z23 Encounter for immunization: Secondary | ICD-10-CM | POA: Diagnosis not present

## 2015-02-15 DIAGNOSIS — E119 Type 2 diabetes mellitus without complications: Secondary | ICD-10-CM

## 2015-02-15 DIAGNOSIS — I10 Essential (primary) hypertension: Secondary | ICD-10-CM | POA: Diagnosis not present

## 2015-02-15 LAB — POCT UA - MICROALBUMIN: Microalbumin Ur, POC: <20 mg/L

## 2015-02-15 LAB — POCT GLYCOSYLATED HEMOGLOBIN (HGB A1C)
Est. average glucose Bld gHb Est-mCnc: 157
Hemoglobin A1C: 7.1

## 2015-02-15 NOTE — Progress Notes (Signed)
Subjective:    Patient ID: Kathleen Campos, female    DOB: 12-13-1940, 74 y.o.   MRN: [id number]  Diabetes She presents for her follow-up (Last A1C 6.9% on 09/19/2014) diabetic visit. She has type 2 diabetes mellitus. Hypoglycemia symptoms include dizziness and headaches. Pertinent negatives for hypoglycemia include no sweats. (Rarely, but often enough that she then has to eat a lot and gets high.  ) Associated symptoms include fatigue. Pertinent negatives for diabetes include no blurred vision, no chest pain, no foot paresthesias, no foot ulcerations, no polydipsia, no polyphagia, no polyuria, no visual change, no weakness and no weight loss. Pertinent negatives for hypoglycemia complications include no blackouts and no required assistance. Symptoms are stable. Current diabetic treatment includes oral agent (triple therapy). She is compliant with treatment all of the time. She is following a generally healthy diet. Her breakfast blood glucose range is generally 130-140 mg/dl. An ACE inhibitor/angiotensin II receptor blocker is not being taken. Eye exam is current.  Hypertension Associated symptoms include headaches and malaise/fatigue. Pertinent negatives include no blurred vision, chest pain, neck pain, orthopnea, palpitations, peripheral edema, shortness of breath or sweats. Treatments tried: HCTZ 12.5mg , Valsartan 80 mg.   Cholesterol also stable. Taking medication without any problems.    Review of Systems  Constitutional: Positive for malaise/fatigue and fatigue. Negative for weight loss.  Eyes: Negative for blurred vision.  Respiratory: Negative for shortness of breath.   Cardiovascular: Negative for chest pain, palpitations and orthopnea.  Endocrine: Negative for polydipsia, polyphagia and polyuria.  Musculoskeletal: Negative for neck pain.  Neurological: Positive for dizziness and headaches. Negative for weakness.   BP 124/62 mmHg  Pulse 64  Temp(Src) 97.7 F (36.5 C) (Oral)   Resp 16  Wt 221 lb (100.245 kg)   Patient Active Problem List   Diagnosis Date Noted  . Otitis externa 12/21/2014  . Nonspecific abnormal finding 07/18/2014  . Allergic rhinitis 07/18/2014  . Colon polyp 07/18/2014  . Acid reflux 07/18/2014  . Hypercholesteremia 07/18/2014  . Low serum cobalamin 07/18/2014  . Adult BMI 30+ 07/18/2014  . Avitaminosis D 07/18/2014  . Diabetes mellitus, type 2 ([place]) 09/09/1998  . AG (atrophic gastritis) 03/06/1998  . BP (high blood pressure) 06/04/1994   Past Medical History  Diagnosis Date  . GERD (gastroesophageal reflux disease)   . Diabetes mellitus without complication ([place])   . Hyperlipidemia   . Hypertension    Current Outpatient Prescriptions on File Prior to Visit  Medication Sig  . aspirin 81 MG tablet ASPIRIN, 81MG  (Oral Tablet Delayed Release)  1 Every Day for 0 days  Quantity: 0.00;  Refills: 0   Ordered :10-Dec-2011  Edmonia James ;  Started 04-October-2007 Active  . Blood Glucose Monitoring Suppl (TRUE METRIX AIR GLUCOSE METER) DEVI 1 Device by Does not apply route daily. To check blood sugar once a day.  . calcium-vitamin D (OSCAL WITH D) 500-200 MG-UNIT per tablet OSCAL 500/200 D-3, 500-200MG -UNIT (Oral Tablet)  1 Twice Daily for 0 days  Quantity: 0.00;  Refills: 0   Ordered :10-Dec-2011  Edmonia James ;  Started 04-October-2007 Active  . Cholecalciferol 1000 UNITS capsule Take by mouth.  . cyanocobalamin 1000 MCG tablet Take by mouth.  Marland Kitchen glipiZIDE (GLIPIZIDE XL) 5 MG 24 hr tablet Take 1 tablet (5 mg total) by mouth daily with breakfast.  . glucose blood test strip To check blood sugar once a day.  . hydrochlorothiazide (HYDRODIURIL) 12.5 MG tablet Take by mouth.  . metFORMIN (GLUCOPHAGE) 1000 MG  tablet TAKE 1 TABLET TWICE DAILY  . naproxen sodium (ANAPROX) 220 MG tablet Take by mouth.  Marland Kitchen omeprazole (PRILOSEC) 20 MG capsule Take by mouth.  . potassium chloride SA (K-DUR,KLOR-CON) 20 MEQ tablet TAKE 1 TABLET EVERY DAY  .  simvastatin (ZOCOR) 20 MG tablet TAKE 1 TABLET AT BEDTIME  . timolol (TIMOPTIC) 0.5 % ophthalmic solution   . TRUEPLUS LANCETS 28G MISC To check blood sugar once a day.  . valsartan (DIOVAN) 80 MG tablet TAKE 1 TABLET EVERYDAY  . pioglitazone (ACTOS) 30 MG tablet Take by mouth.   No current facility-administered medications on file prior to visit.   Allergies  Allergen Reactions  . Clarithromycin   . Codeine Nausea And Vomiting  . Lisinopril Cough  . Morphine   . Ciprofloxacin Rash    nausea & itching.   Past Surgical History  Procedure Laterality Date  . Cervical biopsy  w/ loop electrode excision  2010  . Cholecystectomy  1986  . Back surgery  2003   Social History   Social History  . Marital Status: Married    Spouse Name: Kyung Rudd  . Number of Children: 3  . Years of Education: 12   Occupational History  . private caregiver    Social History Main Topics  . Smoking status: Former Smoker    Quit date: 07/18/1978  . Smokeless tobacco: Never Used  . Alcohol Use: No  . Drug Use: No  . Sexual Activity: Not on file   Other Topics Concern  . Not on file   Social History Narrative   Family History  Problem Relation Age of Onset  . Hypertension Mother   . Heart disease Mother   . Lung cancer Father   . Heart disease Sister   . Hypertension Sister   . Hypertension Brother   . Diabetes Brother        Objective:   Physical Exam  Constitutional: She is oriented to person, place, and time. She appears well-developed and well-nourished.  HENT:  Head: Normocephalic.  Cardiovascular: Normal rate and regular rhythm.   Pulmonary/Chest: Effort normal and breath sounds normal.  Neurological: She is alert and oriented to person, place, and time.  Psychiatric: She has a normal mood and affect. Her behavior is normal. Judgment and thought content normal.   BP 124/62 mmHg  Pulse 64  Temp(Src) 97.7 F (36.5 C) (Oral)  Resp 16  Wt 221 lb (100.245 kg)        Assessment & Plan:  1. Type 2 diabetes mellitus without complication, without long-term current use of insulin (HCC) Blood sugar ok, but not as good as previous.   Will continue medication and eat healthy and exercise and recheck in 3 months.   - POCT UA - Microalbumin - POCT glycosylated hemoglobin (Hb A1C) Results for orders placed or performed in visit on 02/15/15  POCT UA - Microalbumin  Result Value Ref Range   Microalbumin Ur, POC <20 mg/L  POCT glycosylated hemoglobin (Hb A1C)  Result Value Ref Range   Hemoglobin A1C 7.1    Est. average glucose Bld gHb Est-mCnc 157     2. Essential hypertension Condition is stable. Please continue current medication and  plan of care as noted.    3. Flu vaccine need Given today.  - Flu vaccine HIGH DOSE PF   Margarita Rana, MD

## 2015-03-11 ENCOUNTER — Other Ambulatory Visit: Payer: Self-pay | Admitting: Family Medicine

## 2015-04-05 ENCOUNTER — Other Ambulatory Visit: Payer: Self-pay | Admitting: Family Medicine

## 2015-04-05 DIAGNOSIS — E119 Type 2 diabetes mellitus without complications: Secondary | ICD-10-CM

## 2015-04-15 ENCOUNTER — Encounter: Payer: Self-pay | Admitting: Family Medicine

## 2015-04-16 ENCOUNTER — Emergency Department
Admission: EM | Admit: 2015-04-16 | Discharge: 2015-04-16 | Disposition: A | Discharge status: Home / Self Care | Payer: Medicare PPO | Attending: Emergency Medicine | Admitting: Emergency Medicine

## 2015-04-16 ENCOUNTER — Encounter: Payer: Self-pay | Admitting: *Deleted

## 2015-04-16 ENCOUNTER — Emergency Department: Payer: Medicare PPO

## 2015-04-16 DIAGNOSIS — E119 Type 2 diabetes mellitus without complications: Secondary | ICD-10-CM | POA: Insufficient documentation

## 2015-04-16 DIAGNOSIS — I10 Essential (primary) hypertension: Secondary | ICD-10-CM | POA: Diagnosis not present

## 2015-04-16 DIAGNOSIS — Z79899 Other long term (current) drug therapy: Secondary | ICD-10-CM | POA: Diagnosis not present

## 2015-04-16 DIAGNOSIS — R531 Weakness: Secondary | ICD-10-CM | POA: Diagnosis present

## 2015-04-16 DIAGNOSIS — R55 Syncope and collapse: Secondary | ICD-10-CM | POA: Insufficient documentation

## 2015-04-16 DIAGNOSIS — Z7982 Long term (current) use of aspirin: Secondary | ICD-10-CM | POA: Diagnosis not present

## 2015-04-16 DIAGNOSIS — E669 Obesity, unspecified: Secondary | ICD-10-CM | POA: Insufficient documentation

## 2015-04-16 DIAGNOSIS — R42 Dizziness and giddiness: Secondary | ICD-10-CM | POA: Diagnosis not present

## 2015-04-16 DIAGNOSIS — Z791 Long term (current) use of non-steroidal anti-inflammatories (NSAID): Secondary | ICD-10-CM | POA: Diagnosis not present

## 2015-04-16 DIAGNOSIS — Z7984 Long term (current) use of oral hypoglycemic drugs: Secondary | ICD-10-CM | POA: Diagnosis not present

## 2015-04-16 DIAGNOSIS — Z87891 Personal history of nicotine dependence: Secondary | ICD-10-CM | POA: Insufficient documentation

## 2015-04-16 LAB — CBC
HEMATOCRIT: 37.7 % (ref 35.0–47.0)
Hemoglobin: 12.8 g/dL (ref 12.0–16.0)
MCH: 29.6 pg (ref 26.0–34.0)
MCHC: 34 g/dL (ref 32.0–36.0)
MCV: 87.1 fL (ref 80.0–100.0)
PLATELETS: 160 10*3/uL (ref 150–440)
RBC: 4.33 MIL/uL (ref 3.80–5.20)
RDW: 13.1 % (ref 11.5–14.5)
WBC: 5.2 10*3/uL (ref 3.6–11.0)

## 2015-04-16 LAB — URINALYSIS COMPLETE WITH MICROSCOPIC (ARMC ONLY)
BACTERIA UA: NONE SEEN
BILIRUBIN URINE: NEGATIVE
Glucose, UA: 50 mg/dL — AB
Hgb urine dipstick: NEGATIVE
KETONES UR: NEGATIVE mg/dL
Nitrite: NEGATIVE
PH: 8 (ref 5.0–8.0)
PROTEIN: NEGATIVE mg/dL
Specific Gravity, Urine: 1.012 (ref 1.005–1.030)

## 2015-04-16 LAB — BASIC METABOLIC PANEL
Anion gap: 8 (ref 5–15)
BUN: 13 mg/dL (ref 6–20)
CHLORIDE: 100 mmol/L — AB (ref 101–111)
CO2: 28 mmol/L (ref 22–32)
CREATININE: 0.87 mg/dL (ref 0.44–1.00)
Calcium: 9.2 mg/dL (ref 8.9–10.3)
GFR calc Af Amer: >60 mL/min (ref >60)
GFR calc non Af Amer: >60 mL/min (ref >60)
GLUCOSE: 181 mg/dL — AB (ref 65–99)
POTASSIUM: 4.2 mmol/L (ref 3.5–5.1)
Sodium: 136 mmol/L (ref 135–145)

## 2015-04-16 LAB — TROPONIN I: Troponin I: 0.03 ng/mL (ref <0.031)

## 2015-04-16 MED ORDER — ONDANSETRON HCL 4 MG PO TABS: 4.0000 mg | ORAL_TABLET | Freq: Three times a day (TID) | ORAL | Status: DC | PRN

## 2015-04-16 MED ORDER — MECLIZINE HCL 25 MG PO TABS: 25.0000 mg | ORAL_TABLET | Freq: Three times a day (TID) | ORAL | Status: DC | PRN

## 2015-04-16 MED ORDER — SODIUM CHLORIDE 0.9 % IV BOLUS (SEPSIS)
1000.0000 mL | Freq: Once | INTRAVENOUS | Status: AC
  Administered 2015-04-16: 1000 mL via INTRAVENOUS

## 2015-04-16 MED ORDER — ONDANSETRON HCL 4 MG/2ML IJ SOLN
4.0000 mg | Freq: Once | INTRAMUSCULAR | Status: AC
  Administered 2015-04-16: 4 mg via INTRAVENOUS
  Filled 2015-04-16: qty 2

## 2015-04-16 MED ORDER — MECLIZINE HCL 25 MG PO TABS
25.0000 mg | ORAL_TABLET | Freq: Once | ORAL | Status: AC
  Administered 2015-04-16: 25 mg via ORAL
  Filled 2015-04-16: qty 1

## 2015-04-16 NOTE — ED Notes (Signed)
Pt arrived to ED via EMS from home reporting increased weakness beginning last night. Pt reports at 1600 today she felt she was unable to walk. Pt reports being unsure if she could ambulate at this time. Pt reports nausea, nonproductive cough and headache for the past week. Stroke screen negative with EMS and remains negative upon arrival to ED. PT alert and oriented x 4. Pt will not open eyes unless asked to throughout assessment.

## 2015-04-16 NOTE — ED Provider Notes (Addendum)
[facility] Emergency Department Provider Note   ____________________________________________  Time seen: Approximately 8:30 PM I have reviewed the triage vital signs and the triage nursing note.  HISTORY  Chief Complaint Weakness   Historian Patient  HPI Kathleen Campos is a 75 y.o. 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 No headache. She does state that she's had intermittent lightheadedness for several weeks, occasionally with room spinning. Denies confusion. Denies focal weakness or slurred speech or facial droop. She reports nausea without vomiting. No abdominal pain or diarrhea. No chest pain or palpitations. She states she was feeling a little bit under the weather last night and then today felt worse.    Past Medical History  Diagnosis Date  . GERD (gastroesophageal reflux disease)   . Diabetes mellitus without complication ([place])   . Hyperlipidemia   . Hypertension     Patient Active Problem List   Diagnosis Date Noted  . Otitis externa 12/21/2014  . Allergic rhinitis 07/18/2014  . Colon polyp 07/18/2014  . Acid reflux 07/18/2014  . Hypercholesteremia 07/18/2014  . Low serum cobalamin 07/18/2014  . Adult BMI 30+ 07/18/2014  . Avitaminosis D 07/18/2014  . Diabetes mellitus, type 2 ([place]) 09/09/1998  . AG (atrophic gastritis) 03/06/1998  . BP (high blood pressure) 06/04/1994    Past Surgical History  Procedure Laterality Date  . Cervical biopsy  w/ loop electrode excision  2010  . Cholecystectomy  1986  . Back surgery  2003    Current Outpatient Rx  Name  Route  Sig  Dispense  Refill  . aspirin 81 MG tablet      ASPIRIN, 81MG  (Oral Tablet Delayed Release)  1 Every Day for 0 days  Quantity: 0.00;  Refills: 0   Ordered :10-Dec-2011  Edmonia James ;  Started 04-October-2007 Active         . Blood Glucose Monitoring  Suppl (TRUE METRIX AIR GLUCOSE METER) DEVI   Does not apply   1 Device by Does not apply route daily. To check blood sugar once a day.   1 Device   0   . calcium-vitamin D (OSCAL WITH D) 500-200 MG-UNIT per tablet      OSCAL 500/200 D-3, 500-200MG -UNIT (Oral Tablet)  1 Twice Daily for 0 days  Quantity: 0.00;  Refills: 0   Ordered :10-Dec-2011  Edmonia James ;  Started 04-October-2007 Active         . Cholecalciferol 1000 UNITS capsule   Oral   Take by mouth.         . cyanocobalamin 1000 MCG tablet   Oral   Take by mouth.         Marland Kitchen glipiZIDE (GLIPIZIDE XL) 5 MG 24 hr tablet   Oral   Take 1 tablet (5 mg total) by mouth daily with breakfast.   90 tablet   3     Dose change.   Marland Kitchen glucose blood test strip      To check blood sugar once a day.   100 each   3   . hydrochlorothiazide (HYDRODIURIL) 12.5 MG tablet      TAKE 1 TABLET DAILY   90 tablet   3   . meclizine (ANTIVERT) 25 MG tablet   Oral   Take 1 tablet (25 mg total) by mouth 3 (three) times daily as needed for dizziness or nausea.   20 tablet  0   . metFORMIN (GLUCOPHAGE) 1000 MG tablet      TAKE 1 TABLET TWICE DAILY   180 tablet   3   . naproxen sodium (ANAPROX) 220 MG tablet   Oral   Take by mouth.         Marland Kitchen omeprazole (PRILOSEC) 20 MG capsule   Oral   Take by mouth.         . ondansetron (ZOFRAN) 4 MG tablet   Oral   Take 1 tablet (4 mg total) by mouth every 8 (eight) hours as needed for nausea or vomiting.   10 tablet   0   . pioglitazone (ACTOS) 30 MG tablet      TAKE 1 TABLET DAILY   90 tablet   1   . potassium chloride SA (K-DUR,KLOR-CON) 20 MEQ tablet      TAKE 1 TABLET EVERY DAY   90 tablet   3   . simvastatin (ZOCOR) 20 MG tablet      TAKE 1 TABLET AT BEDTIME   90 tablet   3   . timolol (TIMOPTIC) 0.5 % ophthalmic solution               . TRUEPLUS LANCETS 28G MISC      To check blood sugar once a day.   100 each   3   . valsartan (DIOVAN) 80 MG  tablet      TAKE 1 TABLET EVERYDAY   90 tablet   3     Allergies Biaxin; Codeine; Lisinopril; Morphine; and Ciprofloxacin  Family History  Problem Relation Age of Onset  . Hypertension Mother   . Heart disease Mother   . Lung cancer Father   . Heart disease Sister   . Hypertension Sister   . Hypertension Brother   . Diabetes Brother     Social History Social History  Substance Use Topics  . Smoking status: Former Smoker    Quit date: 07/18/1978  . Smokeless tobacco: Never Used  . Alcohol Use: No    Review of Systems  Constitutional: Negative for fever. Eyes: Negative for visual changes. ENT: Negative for sore throat. Occasional ear pressure. Cardiovascular: Negative for chest pain. Respiratory: Negative for shortness of breath. Gastrointestinal: Negative for abdominal pain, vomiting and diarrhea. Genitourinary: Negative for dysuria. Musculoskeletal: Negative for back pain. Skin: Negative for rash. Neurological: Negative for headache. 10 point Review of Systems otherwise negative ____________________________________________   PHYSICAL EXAM:  VITAL SIGNS: ED Triage Vitals  Enc Vitals Group     BP 04/16/15 1914 163/66 mmHg     Pulse Rate 04/16/15 1914 65     Resp 04/16/15 1914 16     Temp 04/16/15 1914 97.8 F (36.6 C)     Temp Source 04/16/15 1914 Oral     SpO2 04/16/15 1914 99 %     Weight 04/16/15 1914 220 lb (99.791 kg)     Height 04/16/15 1914 5\' 7"  (1.702 m)     Head Cir --      Peak Flow --      Pain Score --      Pain Loc --      Pain Edu? --      Excl. in [place]? --      Constitutional: Alert and oriented. Well appearing and in no distress. HEENT   Head: Normocephalic and atraumatic.      Eyes: Conjunctivae are normal. PERRL. Normal extraocular movements.      Ears:  Nose: No congestion/rhinnorhea.   Mouth/Throat: Mucous membranes are moist.   Neck: No stridor. Cardiovascular/Chest: Normal rate, regular rhythm.  No  murmurs, rubs, or gallops. Respiratory: Normal respiratory effort without tachypnea nor retractions. Breath sounds are clear and equal bilaterally. No wheezes/rales/rhonchi. Gastrointestinal: Soft. No distention, no guarding, no rebound. Nontender.  Obese  Genitourinary/rectal:Deferred Musculoskeletal: Nontender with normal range of motion in all extremities. No joint effusions.  No lower extremity tenderness.  No edema. Neurologic:  Normal speech and language. No facial droop. No gross or focal neurologic deficits are appreciated. Skin:  Skin is warm, dry and intact. No rash noted. Psychiatric: Mood and affect are normal. Speech and behavior are normal. Patient exhibits appropriate insight and judgment.  ____________________________________________   EKG I, Lisa Roca, MD, the attending physician have personally viewed and interpreted all ECGs.  62 bpm. Normal sinus rhythm. Narrow QRS. Normal axis. Normal ST and T-wave ____________________________________________  LABS (pertinent positives/negatives)  Basic mental panel without significant abnormalities CBC within normal limits Troponin 0.03 Urinalysis trace leukocytes otherwise negative  ____________________________________________  RADIOLOGY All Xrays were viewed by me. Imaging interpreted by Radiologist.  CT head noncontrast:  IMPRESSION: 1. Chronic right corona radiata/internal capsule infarct with a small amount of superimposed acute ischemia along its margins. 2. Mild chronic small vessel ischemic change elsewhere in the cerebral white matter and brainstem. __________________________________________  PROCEDURES  Procedure(s) performed: None  Critical Care performed: None  ____________________________________________   ED COURSE / ASSESSMENT AND PLAN  Pertinent labs & imaging results that were available during my care of the patient were reviewed by me and considered in my medical decision making (see chart for  details).   Patient is complaining of nausea, and generalized weakness with some dizziness and a near syncopal episode. On history and exam, I'm unsure as to what is likely causing her symptoms but I'm somewhat suspicious that she may be having vertigo given that sometimes she has room spinning associated with the dizziness. I will CT her head, although there are no focal neurologic on exam.   She has no weakness or numbness and I think stroke is a little unlikely. Denies vision changes.  CT head negative.  EKG is reassuring. This would be atypical presentation for ACS. Troponin negative.  Denies diarrhea, although I am suspicious that her symptoms could be an early gastroenteritis given the community viruses that seemed to be going around right now.  Her abdominal exam is benign and nontender when I palpate, and her laboratory examination is also reassuring. I do not suspect acute obstruction or other intra-abdominal surgical/medical emergency.  The episode that brought her in tonight sound like a presyncopal episode. She now feels better and her orthostatics show slight elevated heart rate with standing, but blood pressure stable. Patient will continue to rehydrate at home.    CONSULTATIONS:   None  Patient / Family / Caregiver informed of clinical course, medical decision-making process, and agree with plan.   I discussed return precautions, follow-up instructions, and discharged instructions with patient and/or family.   ___________________________________________   FINAL CLINICAL IMPRESSION(S) / ED DIAGNOSES   Final diagnoses:  Dizziness  Near syncope              Note: This dictation was prepared with Dragon dictation. Any transcriptional errors that result from this process are unintentional   Lisa Roca, MD 04/16/15 2157  Lisa Roca, MD 04/16/15 2203

## 2015-04-16 NOTE — Discharge Instructions (Signed)
You were evaluated for dizziness and generalized weakness, and although no certain cause was found but your exam and evaluation are reassuring in the emergency department tonight.  We discussed I'm suspicious you may be having vertigo or vagal episode.  Return to the emergency department for any worsening condition including any confusion altered mental status, problems with speech, facial droop, chest pain, trouble breathing, worsening dizziness or passing out or any other symptoms concerning to you.   Dizziness Dizziness is a common problem. It is a feeling of unsteadiness or light-headedness. You may feel like you are about to faint. Dizziness can lead to injury if you stumble or fall. Anyone can become dizzy, but dizziness is more common in older adults. This condition can be caused by a number of things, including medicines, dehydration, or illness. HOME CARE INSTRUCTIONS Taking these steps may help with your condition: Eating and Drinking  Drink enough fluid to keep your urine clear or pale yellow. This helps to keep you from becoming dehydrated. Try to drink more clear fluids, such as water.  Do not drink alcohol.  Limit your caffeine intake if directed by your health care provider.  Limit your salt intake if directed by your health care provider. Activity  Avoid making quick movements.  Rise slowly from chairs and steady yourself until you feel okay.  In the morning, first sit up on the side of the bed. When you feel okay, stand slowly while you hold onto something until you know that your balance is fine.  Move your legs often if you need to stand in one place for a long time. Tighten and relax your muscles in your legs while you are standing.  Do not drive or operate heavy machinery if you feel dizzy.  Avoid bending down if you feel dizzy. Place items in your home so that they are easy for you to reach without leaning over. Lifestyle  Do not use any tobacco products,  including cigarettes, chewing tobacco, or electronic cigarettes. If you need help quitting, ask your health care provider.  Try to reduce your stress level, such as with yoga or meditation. Talk with your health care provider if you need help. General Instructions  Watch your dizziness for any changes.  Take medicines only as directed by your health care provider. Talk with your health care provider if you think that your dizziness is caused by a medicine that you are taking.  Tell a friend or a family member that you are feeling dizzy. If he or she notices any changes in your behavior, have this person call your health care provider.  Keep all follow-up visits as directed by your health care provider. This is important. SEEK MEDICAL CARE IF:  Your dizziness does not go away.  Your dizziness or light-headedness gets worse.  You feel nauseous.  You have reduced hearing.  You have new symptoms.  You are unsteady on your feet or you feel like the room is spinning. SEEK IMMEDIATE MEDICAL CARE IF:  You vomit or have diarrhea and are unable to eat or drink anything.  You have problems talking, walking, swallowing, or using your arms, hands, or legs.  You feel generally weak.  You are not thinking clearly or you have trouble forming sentences. It may take a friend or family member to notice this.  You have chest pain, abdominal pain, shortness of breath, or sweating.  Your vision changes.  You notice any bleeding.  You have a headache.  You have  neck pain or a stiff neck.  You have a fever.   This information is not intended to replace advice given to you by your health care provider. Make sure you discuss any questions you have with your health care provider.   Document Released: 08/19/2000 Document Revised: 07/10/2014 Document Reviewed: 02/19/2014 Elsevier Interactive Patient Education 2016 [employer].   Near-Syncope Near-syncope (commonly known as near fainting)  is sudden weakness, dizziness, or feeling like you might pass out. This can happen when getting up or while standing for a long time. It is caused by a sudden decrease in blood flow to the brain, which can occur for various reasons. Most of the reasons are not serious.  HOME CARE Watch your condition for any changes.  Have someone stay with you until you feel stable.  If you feel like you are going to pass out:  Lie down right away.  Prop your feet up if you can.  Breathe deeply and steadily.  Move only when the feeling has gone away. Most of the time, this feeling lasts only a few minutes. You may feel tired for several hours.  Drink enough fluids to keep your pee (urine) clear or pale yellow.  If you are taking blood pressure or heart medicine, stand up slowly.  Follow up with your doctor as told. GET HELP RIGHT AWAY IF:   You have a severe headache.  You have unusual pain in the chest, belly (abdomen), or back.  You have bleeding from the mouth or butt (rectum), or you have black or tarry poop (stool).  You feel your heart beat differently than normal, or you have a very fast pulse.  You pass out, or you twitch and shake when you pass out.  You pass out when sitting or lying down.  You feel confused.  You have trouble walking.  You are weak.  You have vision problems. MAKE SURE YOU:   Understand these instructions.  Will watch your condition.  Will get help right away if you are not doing well or get worse.   This information is not intended to replace advice given to you by your health care provider. Make sure you discuss any questions you have with your health care provider.   Document Released: 08/12/2007 Document Revised: 03/16/2014 Document Reviewed: 07/29/2012 Elsevier Interactive Patient Education 2016 [employer].   Vertigo Vertigo means you feel like you or your surroundings are moving when they are not. Vertigo can be dangerous if it occurs when  you are at work, driving, or performing difficult activities.  CAUSES  Vertigo occurs when there is a conflict of signals sent to your brain from the visual and sensory systems in your body. There are many different causes of vertigo, including:  Infections, especially in the inner ear.  A bad reaction to a drug or misuse of alcohol and medicines.  Withdrawal from drugs or alcohol.  Rapidly changing positions, such as lying down or rolling over in bed.  A migraine headache.  Decreased blood flow to the brain.  Increased pressure in the brain from a head injury, infection, tumor, or bleeding. SYMPTOMS  You may feel as though the world is spinning around or you are falling to the ground. Because your balance is upset, vertigo can cause nausea and vomiting. You may have involuntary eye movements (nystagmus). DIAGNOSIS  Vertigo is usually diagnosed by physical exam. If the cause of your vertigo is unknown, your caregiver may perform imaging tests, such as an  MRI scan (magnetic resonance imaging). TREATMENT  Most cases of vertigo resolve on their own, without treatment. Depending on the cause, your caregiver may prescribe certain medicines. If your vertigo is related to body position issues, your caregiver may recommend movements or procedures to correct the problem. In rare cases, if your vertigo is caused by certain inner ear problems, you may need surgery. HOME CARE INSTRUCTIONS   Follow your caregiver's instructions.  Avoid driving.  Avoid operating heavy machinery.  Avoid performing any tasks that would be dangerous to you or others during a vertigo episode.  Tell your caregiver if you notice that certain medicines seem to be causing your vertigo. Some of the medicines used to treat vertigo episodes can actually make them worse in some people. SEEK IMMEDIATE MEDICAL CARE IF:   Your medicines do not relieve your vertigo or are making it worse.  You develop problems with talking,  walking, weakness, or using your arms, hands, or legs.  You develop severe headaches.  Your nausea or vomiting continues or gets worse.  You develop visual changes.  A family member notices behavioral changes.  Your condition gets worse. MAKE SURE YOU:  Understand these instructions.  Will watch your condition.  Will get help right away if you are not doing well or get worse.   This information is not intended to replace advice given to you by your health care provider. Make sure you discuss any questions you have with your health care provider.   Document Released: 12/03/2004 Document Revised: 05/18/2011 Document Reviewed: 06/18/2014 Elsevier Interactive Patient Education [employer].

## 2015-04-24 ENCOUNTER — Emergency Department: Payer: Medicare PPO

## 2015-04-24 ENCOUNTER — Encounter: Payer: Self-pay | Admitting: Emergency Medicine

## 2015-04-24 ENCOUNTER — Observation Stay
Admission: EM | Admit: 2015-04-24 | Discharge: 2015-04-25 | Disposition: A | Discharge status: Home / Self Care | Payer: Medicare PPO | Attending: Internal Medicine | Admitting: Internal Medicine

## 2015-04-24 DIAGNOSIS — E559 Vitamin D deficiency, unspecified: Secondary | ICD-10-CM | POA: Diagnosis not present

## 2015-04-24 DIAGNOSIS — R531 Weakness: Secondary | ICD-10-CM | POA: Diagnosis present

## 2015-04-24 DIAGNOSIS — I10 Essential (primary) hypertension: Secondary | ICD-10-CM | POA: Diagnosis present

## 2015-04-24 DIAGNOSIS — I340 Nonrheumatic mitral (valve) insufficiency: Secondary | ICD-10-CM | POA: Insufficient documentation

## 2015-04-24 DIAGNOSIS — E785 Hyperlipidemia, unspecified: Secondary | ICD-10-CM | POA: Diagnosis present

## 2015-04-24 DIAGNOSIS — Z7984 Long term (current) use of oral hypoglycemic drugs: Secondary | ICD-10-CM | POA: Diagnosis not present

## 2015-04-24 DIAGNOSIS — E7800 Pure hypercholesterolemia, unspecified: Secondary | ICD-10-CM | POA: Diagnosis not present

## 2015-04-24 DIAGNOSIS — K2940 Chronic atrophic gastritis without bleeding: Secondary | ICD-10-CM | POA: Diagnosis not present

## 2015-04-24 DIAGNOSIS — Z79899 Other long term (current) drug therapy: Secondary | ICD-10-CM | POA: Insufficient documentation

## 2015-04-24 DIAGNOSIS — K219 Gastro-esophageal reflux disease without esophagitis: Secondary | ICD-10-CM | POA: Diagnosis present

## 2015-04-24 DIAGNOSIS — R110 Nausea: Secondary | ICD-10-CM | POA: Diagnosis present

## 2015-04-24 DIAGNOSIS — J309 Allergic rhinitis, unspecified: Secondary | ICD-10-CM | POA: Insufficient documentation

## 2015-04-24 DIAGNOSIS — E119 Type 2 diabetes mellitus without complications: Secondary | ICD-10-CM | POA: Diagnosis not present

## 2015-04-24 DIAGNOSIS — Z8673 Personal history of transient ischemic attack (TIA), and cerebral infarction without residual deficits: Secondary | ICD-10-CM | POA: Insufficient documentation

## 2015-04-24 DIAGNOSIS — R7989 Other specified abnormal findings of blood chemistry: Secondary | ICD-10-CM | POA: Diagnosis present

## 2015-04-24 DIAGNOSIS — Z7982 Long term (current) use of aspirin: Secondary | ICD-10-CM | POA: Diagnosis not present

## 2015-04-24 DIAGNOSIS — Z87891 Personal history of nicotine dependence: Secondary | ICD-10-CM | POA: Insufficient documentation

## 2015-04-24 DIAGNOSIS — R42 Dizziness and giddiness: Principal | ICD-10-CM

## 2015-04-24 DIAGNOSIS — I071 Rheumatic tricuspid insufficiency: Secondary | ICD-10-CM | POA: Diagnosis not present

## 2015-04-24 DIAGNOSIS — R778 Other specified abnormalities of plasma proteins: Secondary | ICD-10-CM | POA: Diagnosis present

## 2015-04-24 LAB — BASIC METABOLIC PANEL
ANION GAP: 12 (ref 5–15)
BUN: 14 mg/dL (ref 6–20)
CALCIUM: 9.3 mg/dL (ref 8.9–10.3)
CHLORIDE: 99 mmol/L — AB (ref 101–111)
CO2: 22 mmol/L (ref 22–32)
CREATININE: 0.8 mg/dL (ref 0.44–1.00)
GFR calc Af Amer: >60 mL/min (ref >60)
GFR calc non Af Amer: >60 mL/min (ref >60)
GLUCOSE: 204 mg/dL — AB (ref 65–99)
Potassium: 4.1 mmol/L (ref 3.5–5.1)
Sodium: 133 mmol/L — ABNORMAL LOW (ref 135–145)

## 2015-04-24 LAB — CBC
HCT: 39.6 % (ref 35.0–47.0)
Hemoglobin: 13.6 g/dL (ref 12.0–16.0)
MCH: 29.5 pg (ref 26.0–34.0)
MCHC: 34.4 g/dL (ref 32.0–36.0)
MCV: 85.6 fL (ref 80.0–100.0)
PLATELETS: 173 10*3/uL (ref 150–440)
RBC: 4.62 MIL/uL (ref 3.80–5.20)
RDW: 13 % (ref 11.5–14.5)
WBC: 6.1 10*3/uL (ref 3.6–11.0)

## 2015-04-24 LAB — GLUCOSE, CAPILLARY: GLUCOSE-CAPILLARY: 168 mg/dL — AB (ref 65–99)

## 2015-04-24 LAB — TROPONIN I: TROPONIN I: 0.04 ng/mL — AB (ref <0.031)

## 2015-04-24 MED ORDER — DIAZEPAM 2 MG PO TABS
2.0000 mg | ORAL_TABLET | Freq: Once | ORAL | Status: AC
  Administered 2015-04-24: 2 mg via ORAL
  Filled 2015-04-24: qty 1

## 2015-04-24 MED ORDER — ASPIRIN 81 MG PO CHEW
324.0000 mg | CHEWABLE_TABLET | Freq: Once | ORAL | Status: AC
  Administered 2015-04-24: 324 mg via ORAL
  Filled 2015-04-24: qty 4

## 2015-04-24 NOTE — ED Notes (Signed)
MD [place] notified.

## 2015-04-24 NOTE — ED Provider Notes (Signed)
[facility] Emergency Department Provider Note  ____________________________________________  Time seen: Approximately 920 PM  I have reviewed the triage vital signs and the nursing notes.   HISTORY  Chief Complaint Dizziness and Weakness    HPI Kathleen Campos is a 75 y.o. female with a history of diabetes and hypertension who is presenting today with vertigo. She says that over the past week she has felt nauseous and occasionally dizzy. She said that about 5 PM today she was folding laundry when she became acutely vertiginous. She said she was sitting at the time. She says that the sensation lasts several hours until it spontaneously went away. She said she has been taking meclizine without any relief. Has also been taking Zofran for nausea with minimal relief. She has a problem with her primary care doctor tomorrow. Denies any chest pain or shortness of breath. Does say that she feels sensitivity to sound and ringing in her ears especially on the right. Denies any runny nose or cough. No known sick contacts. Does not report any diarrhea. No history of heart attacks or strokes.Not reporting any dizziness at this time. Is unable to say whether movement worsens the symptoms. Says that when she is dizzy she needs to close her eyes to control the spinning sensation. She says that the nausea and the dizziness are sometimes mutually exclusive but that the dizziness definitely makes the nausea worse.   Past Medical History  Diagnosis Date  . GERD (gastroesophageal reflux disease)   . Diabetes mellitus without complication ([place])   . Hyperlipidemia   . Hypertension     Patient Active Problem List   Diagnosis Date Noted  . GERD (gastroesophageal reflux disease) 04/24/2015  . HLD (hyperlipidemia) 04/24/2015  . Dizziness 04/24/2015  . Elevated troponin 04/24/2015  . Otitis externa 12/21/2014  . Allergic rhinitis 07/18/2014  . Colon polyp 07/18/2014  . Acid reflux  07/18/2014  . Hypercholesteremia 07/18/2014  . Low serum cobalamin 07/18/2014  . Adult BMI 30+ 07/18/2014  . Avitaminosis D 07/18/2014  . Diabetes mellitus, type 2 ([place]) 09/09/1998  . AG (atrophic gastritis) 03/06/1998  . HTN (hypertension) 06/04/1994    Past Surgical History  Procedure Laterality Date  . Cervical biopsy  w/ loop electrode excision  2010  . Cholecystectomy  1986  . Back surgery  2003    Current Outpatient Rx  Name  Route  Sig  Dispense  Refill  . aspirin 81 MG tablet      ASPIRIN, 81MG  (Oral Tablet Delayed Release)  1 Every Day for 0 days  Quantity: 0.00;  Refills: 0   Ordered :10-Dec-2011  Edmonia James ;  Started 04-October-2007 Active         . Blood Glucose Monitoring Suppl (TRUE METRIX AIR GLUCOSE METER) DEVI   Does not apply   1 Device by Does not apply route daily. To check blood sugar once a day.   1 Device   0   . calcium-vitamin D (OSCAL WITH D) 500-200 MG-UNIT per tablet      OSCAL 500/200 D-3, 500-200MG -UNIT (Oral Tablet)  1 Twice Daily for 0 days  Quantity: 0.00;  Refills: 0   Ordered :10-Dec-2011  Edmonia James ;  Started 04-October-2007 Active         . Cholecalciferol 1000 UNITS capsule   Oral   Take by mouth.         . cyanocobalamin 1000 MCG tablet   Oral   Take by mouth.         Marland Kitchen  glipiZIDE (GLIPIZIDE XL) 5 MG 24 hr tablet   Oral   Take 1 tablet (5 mg total) by mouth daily with breakfast.   90 tablet   3     Dose change.   Marland Kitchen glucose blood test strip      To check blood sugar once a day.   100 each   3   . hydrochlorothiazide (HYDRODIURIL) 12.5 MG tablet      TAKE 1 TABLET DAILY   90 tablet   3   . meclizine (ANTIVERT) 25 MG tablet   Oral   Take 1 tablet (25 mg total) by mouth 3 (three) times daily as needed for dizziness or nausea.   20 tablet   0   . metFORMIN (GLUCOPHAGE) 1000 MG tablet      TAKE 1 TABLET TWICE DAILY   180 tablet   3   . naproxen sodium (ANAPROX) 220 MG tablet   Oral   Take by  mouth.         Marland Kitchen omeprazole (PRILOSEC) 20 MG capsule   Oral   Take by mouth.         . ondansetron (ZOFRAN) 4 MG tablet   Oral   Take 1 tablet (4 mg total) by mouth every 8 (eight) hours as needed for nausea or vomiting.   10 tablet   0   . pioglitazone (ACTOS) 30 MG tablet      TAKE 1 TABLET DAILY   90 tablet   1   . potassium chloride SA (K-DUR,KLOR-CON) 20 MEQ tablet      TAKE 1 TABLET EVERY DAY   90 tablet   3   . simvastatin (ZOCOR) 20 MG tablet      TAKE 1 TABLET AT BEDTIME   90 tablet   3   . timolol (TIMOPTIC) 0.5 % ophthalmic solution               . TRUEPLUS LANCETS 28G MISC      To check blood sugar once a day.   100 each   3   . valsartan (DIOVAN) 80 MG tablet      TAKE 1 TABLET EVERYDAY   90 tablet   3     Allergies Biaxin; Codeine; Lisinopril; Morphine; and Ciprofloxacin  Family History  Problem Relation Age of Onset  . Hypertension Mother   . Heart disease Mother   . Lung cancer Father   . Heart disease Sister   . Hypertension Sister   . Hypertension Brother   . Diabetes Brother     Social History Social History  Substance Use Topics  . Smoking status: Former Smoker    Quit date: 07/18/1978  . Smokeless tobacco: Never Used  . Alcohol Use: No    Review of Systems Constitutional: No fever/chills Eyes: No visual changes. ENT: No sore throat. Cardiovascular: Denies chest pain. Respiratory: Denies shortness of breath. Gastrointestinal: No abdominal pain. no vomiting.  No diarrhea.  No constipation. Genitourinary: Negative for dysuria. Musculoskeletal: Negative for back pain. Skin: Negative for rash. Neurological: Negative for headaches, focal weakness or numbness.  10-point ROS otherwise negative.  ____________________________________________   PHYSICAL EXAM:  VITAL SIGNS: ED Triage Vitals  Enc Vitals Group     BP 04/24/15 2005 133/70 mmHg     Pulse Rate 04/24/15 2005 66     Resp 04/24/15 2005 16     Temp  04/24/15 2005 98.3 F (36.8 C)     Temp src --  SpO2 04/24/15 2005 99 %     Weight --      Height --      Head Cir --      Peak Flow --      Pain Score 04/24/15 2006 0     Pain Loc --      Pain Edu? --      Excl. in [place]? --     Constitutional: Alert and oriented. Well appearing and in no acute distress. Eyes: Conjunctivae are normal. PERRL. EOMI. Head: Atraumatic. Nose: No congestion/rhinnorhea. Mouth/Throat: Mucous membranes are moist.  Oropharynx non-erythematous. Neck: No stridor.   Cardiovascular: Normal rate, regular rhythm. Grossly normal heart sounds.  Good peripheral circulation. Respiratory: Normal respiratory effort.  No retractions. Lungs CTAB. Gastrointestinal: Soft and nontender. No distention. No abdominal bruits. No CVA tenderness. Musculoskeletal: No lower extremity tenderness nor edema.  No joint effusions. Neurologic:  Normal speech and language. No gross focal neurologic deficits are appreciated. No nystagmus. No ataxia on finger to nose or heel to shin testing. Skin:  Skin is warm, dry and intact. No rash noted. Psychiatric: Mood and affect are normal. Speech and behavior are normal.  ____________________________________________   LABS (all labs ordered are listed, but only abnormal results are displayed)  Labs Reviewed  BASIC METABOLIC PANEL - Abnormal; Notable for the following:    Sodium 133 (*)    Chloride 99 (*)    Glucose, Bld 204 (*)    All other components within normal limits  TROPONIN I - Abnormal; Notable for the following:    Troponin I 0.04 (*)    All other components within normal limits  GLUCOSE, CAPILLARY - Abnormal; Notable for the following:    Glucose-Capillary 168 (*)    All other components within normal limits  CBC  URINALYSIS COMPLETEWITH MICROSCOPIC ([facility] ONLY)  CBG MONITORING, ED   ____________________________________________  EKG  ED ECG REPORT I, Doran Stabler, the attending physician, personally viewed and  interpreted this ECG.   Date: 04/24/2015  EKG Time: 2011  Rate: 69  Rhythm: normal sinus rhythm  Axis: Normal axis  Intervals:none  ST&T Change: No ST segment elevation or depression. No abnormal T-wave inversion.  ____________________________________________  RADIOLOGY  CAT scan without any acute findings. No change from previous scan done 1 week ago. ____________________________________________   PROCEDURES ____________________________________________   INITIAL IMPRESSION / ASSESSMENT AND PLAN / ED COURSE  Pertinent labs & imaging results that were available during my care of the patient were reviewed by me and considered in my medical decision making (see chart for details).  ----------------------------------------- 11:37 PM on 04/24/2015 -----------------------------------------  Patient feels that her nausea is reduced after the Valium. However, now has an elevated troponin. Patient is bouncing back to the hospital with his anus nausea not relieved with meclizine. She had a lot of signs appointment to inner ear pathology. However, given her now elevated troponin I believe that prolonged telemetry monitoring with turning of her troponins would be merited as well. Patient understands the plan and willing to comply. Family also there and understands the plan for admission and wanted to comply. Signed out to Dr. Jannifer Franklin. ____________________________________________   FINAL CLINICAL IMPRESSION(S) / ED DIAGNOSES  Vertigo. Weakness. Nausea.    Orbie Pyo, MD 04/24/15 [id number]

## 2015-04-24 NOTE — ED Notes (Signed)
Patient seen in ED last Monday and diagnosed with Vertigo.  Patient's daughter states that patient is complaining of dizziness, nausea, and muffled sounds in ears.  Onset of symptoms last week.  Patient states nausea and lightheaded feeling has never stopped.  Has appointment with PCP tomorrow.

## 2015-04-24 NOTE — ED Notes (Signed)
Pt urinated w/o giving sample.

## 2015-04-25 ENCOUNTER — Inpatient Hospital Stay: Payer: Medicare PPO | Admitting: Family Medicine

## 2015-04-25 ENCOUNTER — Inpatient Hospital Stay
Admit: 2015-04-25 | Discharge: 2015-04-25 | Disposition: A | Discharge status: Home / Self Care | Payer: Medicare PPO | Attending: Internal Medicine | Admitting: Internal Medicine

## 2015-04-25 ENCOUNTER — Telehealth: Payer: Self-pay | Admitting: Family Medicine

## 2015-04-25 LAB — CBC
HEMATOCRIT: 39 % (ref 35.0–47.0)
HEMOGLOBIN: 13.3 g/dL (ref 12.0–16.0)
MCH: 29.5 pg (ref 26.0–34.0)
MCHC: 34.1 g/dL (ref 32.0–36.0)
MCV: 86.6 fL (ref 80.0–100.0)
Platelets: 164 10*3/uL (ref 150–440)
RBC: 4.5 MIL/uL (ref 3.80–5.20)
RDW: 12.7 % (ref 11.5–14.5)
WBC: 5.4 10*3/uL (ref 3.6–11.0)

## 2015-04-25 LAB — BASIC METABOLIC PANEL
ANION GAP: 9 (ref 5–15)
BUN: 12 mg/dL (ref 6–20)
CHLORIDE: 103 mmol/L (ref 101–111)
CO2: 27 mmol/L (ref 22–32)
Calcium: 9 mg/dL (ref 8.9–10.3)
Creatinine, Ser: 0.73 mg/dL (ref 0.44–1.00)
GFR calc Af Amer: >60 mL/min (ref >60)
GLUCOSE: 138 mg/dL — AB (ref 65–99)
POTASSIUM: 4.2 mmol/L (ref 3.5–5.1)
Sodium: 139 mmol/L (ref 135–145)

## 2015-04-25 LAB — TROPONIN I
TROPONIN I: 0.04 ng/mL — AB (ref <0.031)
Troponin I: <0.03 ng/mL (ref <0.031)
Troponin I: <0.03 ng/mL (ref <0.031)

## 2015-04-25 LAB — URINALYSIS COMPLETE WITH MICROSCOPIC (ARMC ONLY)
BILIRUBIN URINE: NEGATIVE
Bacteria, UA: NONE SEEN
Glucose, UA: NEGATIVE mg/dL
HGB URINE DIPSTICK: NEGATIVE
Ketones, ur: NEGATIVE mg/dL
NITRITE: NEGATIVE
PH: 6 (ref 5.0–8.0)
Protein, ur: NEGATIVE mg/dL
Specific Gravity, Urine: 1.003 — ABNORMAL LOW (ref 1.005–1.030)
Squamous Epithelial / LPF: NONE SEEN

## 2015-04-25 LAB — GLUCOSE, CAPILLARY
GLUCOSE-CAPILLARY: 102 mg/dL — AB (ref 65–99)
GLUCOSE-CAPILLARY: 136 mg/dL — AB (ref 65–99)
Glucose-Capillary: 184 mg/dL — ABNORMAL HIGH (ref 65–99)
Glucose-Capillary: 209 mg/dL — ABNORMAL HIGH (ref 65–99)

## 2015-04-25 LAB — HEMOGLOBIN A1C: Hgb A1c MFr Bld: 6 % (ref 4.0–6.0)

## 2015-04-25 MED ORDER — MECLIZINE HCL 25 MG PO TABS: 25.0000 mg | ORAL_TABLET | Freq: Three times a day (TID) | ORAL | Status: DC | PRN

## 2015-04-25 MED ORDER — INSULIN ASPART 100 UNIT/ML ~~LOC~~ SOLN: 0.0000 [IU] | Freq: Every day | SUBCUTANEOUS | Status: DC

## 2015-04-25 MED ORDER — ONDANSETRON HCL 4 MG PO TABS: 4.0000 mg | ORAL_TABLET | Freq: Four times a day (QID) | ORAL | Status: DC | PRN

## 2015-04-25 MED ORDER — ACETAMINOPHEN 325 MG PO TABS: 650.0000 mg | ORAL_TABLET | Freq: Four times a day (QID) | ORAL | Status: DC | PRN

## 2015-04-25 MED ORDER — ONDANSETRON HCL 4 MG PO TABS: 4.0000 mg | ORAL_TABLET | Freq: Three times a day (TID) | ORAL | Status: DC | PRN

## 2015-04-25 MED ORDER — OMEPRAZOLE 20 MG PO CPDR: 20.0000 mg | DELAYED_RELEASE_CAPSULE | Freq: Every day | ORAL | Status: DC

## 2015-04-25 MED ORDER — ONDANSETRON HCL 4 MG PO TABS
4.0000 mg | ORAL_TABLET | Freq: Four times a day (QID) | ORAL | Status: DC | PRN
  Administered 2015-04-25: 09:00:00 4 mg via ORAL
  Filled 2015-04-25: qty 1

## 2015-04-25 MED ORDER — ASPIRIN EC 81 MG PO TBEC
81.0000 mg | DELAYED_RELEASE_TABLET | Freq: Every day | ORAL | Status: DC
  Administered 2015-04-25: 81 mg via ORAL
  Filled 2015-04-25: qty 1

## 2015-04-25 MED ORDER — INSULIN ASPART 100 UNIT/ML ~~LOC~~ SOLN
0.0000 [IU] | Freq: Three times a day (TID) | SUBCUTANEOUS | Status: DC
  Administered 2015-04-25: 3 [IU] via SUBCUTANEOUS
  Administered 2015-04-25: 08:00:00 1 [IU] via SUBCUTANEOUS
  Filled 2015-04-25: qty 1
  Filled 2015-04-25 (×2): qty 3

## 2015-04-25 MED ORDER — ACETAMINOPHEN 650 MG RE SUPP: 650.0000 mg | Freq: Four times a day (QID) | RECTAL | Status: DC | PRN

## 2015-04-25 MED ORDER — ONDANSETRON HCL 4 MG/2ML IJ SOLN: 4.0000 mg | Freq: Four times a day (QID) | INTRAMUSCULAR | Status: DC | PRN

## 2015-04-25 MED ORDER — TIMOLOL MALEATE 0.5 % OP SOLN
1.0000 [drp] | Freq: Every day | OPHTHALMIC | Status: DC
  Administered 2015-04-25: 1 [drp] via OPHTHALMIC
  Filled 2015-04-25: qty 5

## 2015-04-25 MED ORDER — SODIUM CHLORIDE 0.9 % IV SOLN
INTRAVENOUS | Status: AC
  Administered 2015-04-25: 02:00:00 via INTRAVENOUS

## 2015-04-25 MED ORDER — SODIUM CHLORIDE 0.9% FLUSH
3.0000 mL | Freq: Two times a day (BID) | INTRAVENOUS | Status: DC
  Administered 2015-04-25 (×2): 3 mL via INTRAVENOUS

## 2015-04-25 MED ORDER — PANTOPRAZOLE SODIUM 40 MG PO TBEC
40.0000 mg | DELAYED_RELEASE_TABLET | Freq: Every day | ORAL | Status: DC
  Administered 2015-04-25: 08:00:00 40 mg via ORAL
  Filled 2015-04-25: qty 1

## 2015-04-25 MED ORDER — IRBESARTAN 75 MG PO TABS
75.0000 mg | ORAL_TABLET | Freq: Every day | ORAL | Status: DC
  Administered 2015-04-25: 08:00:00 75 mg via ORAL
  Filled 2015-04-25: qty 1

## 2015-04-25 MED ORDER — SIMVASTATIN 20 MG PO TABS
20.0000 mg | ORAL_TABLET | Freq: Every day | ORAL | Status: DC
  Administered 2015-04-25: 02:00:00 20 mg via ORAL
  Filled 2015-04-25: qty 1

## 2015-04-25 MED ORDER — ENOXAPARIN SODIUM 40 MG/0.4ML ~~LOC~~ SOLN
40.0000 mg | Freq: Every day | SUBCUTANEOUS | Status: DC
  Administered 2015-04-25: 40 mg via SUBCUTANEOUS
  Filled 2015-04-25: qty 0.4

## 2015-04-25 NOTE — H&P (Signed)
[facility] [place] NAME: Kathleen Campos    MR#:  [id number]  DATE OF BIRTH:  Dec 20, 1940  DATE OF ADMISSION:  04/24/2015  PRIMARY CARE PHYSICIAN: Margarita Rana, MD   REQUESTING/REFERRING PHYSICIAN: Clearnce Hasten, MD  CHIEF COMPLAINT:   Chief Complaint  Patient presents with  . Dizziness  . Weakness    HISTORY OF PRESENT ILLNESS:  Kathleen Campos  is a 75 y.o. female who presents with dizziness with nausea and weakness. Patient states that her symptoms started in the afternoon, and that she initially just had the dizziness with some associated nausea without vomiting. However, afterwards she tried to get up and also felt extremely weak all over. She came in the ED for evaluation and was found to have a mildly elevated troponin 0.04. Hospitalists were called for admission.  PAST MEDICAL HISTORY:   Past Medical History  Diagnosis Date  . GERD (gastroesophageal reflux disease)   . Diabetes mellitus without complication ([place])   . Hyperlipidemia   . Hypertension     PAST SURGICAL HISTORY:   Past Surgical History  Procedure Laterality Date  . Cervical biopsy  w/ loop electrode excision  2010  . Cholecystectomy  1986  . Back surgery  2003    SOCIAL HISTORY:   Social History  Substance Use Topics  . Smoking status: Former Smoker    Quit date: 07/18/1978  . Smokeless tobacco: Never Used  . Alcohol Use: No    FAMILY HISTORY:   Family History  Problem Relation Age of Onset  . Hypertension Mother   . Heart disease Mother   . Lung cancer Father   . Heart disease Sister   . Hypertension Sister   . Hypertension Brother   . Diabetes Brother     DRUG ALLERGIES:   Allergies  Allergen Reactions  . Biaxin [Clarithromycin] Nausea And Vomiting  . Codeine Nausea And Vomiting  . Lisinopril Cough  . Morphine Nausea And Vomiting  . Ciprofloxacin Itching, Nausea Only and Rash    MEDICATIONS AT HOME:   Prior to  Admission medications   Medication Sig Start Date End Date Taking? Authorizing Provider  aspirin 81 MG tablet Take 81 mg by mouth daily.  10/04/07  Yes Historical Provider, MD  Blood Glucose Monitoring Suppl (TRUE METRIX AIR GLUCOSE METER) DEVI 1 Device by Does not apply route daily. To check blood sugar once a day. 08/20/14  Yes Margarita Rana, MD  calcium-vitamin D (OSCAL WITH D) 500-200 MG-UNIT per tablet Take 1 tablet by mouth 2 (two) times daily.  10/04/07  Yes Historical Provider, MD  Cholecalciferol 1000 UNITS capsule Take 1,000 Units by mouth daily.    Yes Historical Provider, MD  cyanocobalamin 1000 MCG tablet Take 1,000 mcg by mouth daily.    Yes Historical Provider, MD  glipiZIDE (GLIPIZIDE XL) 5 MG 24 hr tablet Take 1 tablet (5 mg total) by mouth daily with breakfast. 09/19/14  Yes Margarita Rana, MD  glucose blood test strip To check blood sugar once a day. 08/20/14  Yes Margarita Rana, MD  hydrochlorothiazide (MICROZIDE) 12.5 MG capsule Take 12.5 mg by mouth daily.   Yes Historical Provider, MD  meclizine (ANTIVERT) 25 MG tablet Take 1 tablet (25 mg total) by mouth 3 (three) times daily as needed for dizziness or nausea. 04/16/15  Yes Lisa Roca, MD  metFORMIN (GLUCOPHAGE) 1000 MG tablet Take 1,000 mg by mouth 2 (two) times daily with a meal.   Yes Historical Provider,  MD  naproxen sodium (ANAPROX) 220 MG tablet Take 220 mg by mouth 2 (two) times daily as needed (pain).    Yes Historical Provider, MD  omeprazole (PRILOSEC) 20 MG capsule Take 20 mg by mouth daily as needed (acid reflux).  03/18/12  Yes Historical Provider, MD  ondansetron (ZOFRAN) 4 MG tablet Take 1 tablet (4 mg total) by mouth every 8 (eight) hours as needed for nausea or vomiting. 04/16/15  Yes Lisa Roca, MD  pioglitazone (ACTOS) 30 MG tablet Take 30 mg by mouth daily.   Yes Historical Provider, MD  potassium chloride SA (K-DUR,KLOR-CON) 20 MEQ tablet Take 20 mEq by mouth daily.   Yes Historical Provider, MD  simvastatin  (ZOCOR) 20 MG tablet Take 20 mg by mouth at bedtime.   Yes Historical Provider, MD  timolol (TIMOPTIC) 0.5 % ophthalmic solution Place 1 drop into both eyes daily.  11/13/14  Yes Historical Provider, MD  TRUEPLUS LANCETS 28G MISC To check blood sugar once a day. 08/20/14  Yes Margarita Rana, MD  valsartan (DIOVAN) 80 MG tablet Take 80 mg by mouth daily.   Yes Historical Provider, MD    REVIEW OF SYSTEMS:  Review of Systems  Constitutional: Negative for fever, chills, weight loss and malaise/fatigue.  HENT: Negative for ear pain, hearing loss and tinnitus.   Eyes: Negative for blurred vision, double vision, pain and redness.  Respiratory: Negative for cough, hemoptysis and shortness of breath.   Cardiovascular: Negative for chest pain, palpitations, orthopnea and leg swelling.  Gastrointestinal: Positive for nausea. Negative for vomiting, abdominal pain, diarrhea and constipation.  Genitourinary: Negative for dysuria, frequency and hematuria.  Musculoskeletal: Negative for back pain, joint pain and neck pain.  Skin:       No acne, rash, or lesions  Neurological: Positive for dizziness and weakness. Negative for tremors and focal weakness.  Endo/Heme/Allergies: Negative for polydipsia. Does not bruise/bleed easily.  Psychiatric/Behavioral: Negative for depression. The patient is not nervous/anxious and does not have insomnia.      VITAL SIGNS:   Filed Vitals:   04/24/15 2230 04/24/15 2300 04/24/15 2330 04/25/15 0000  BP: 132/59 134/61 137/62 142/67  Pulse: 62 65 63 61  Temp:      Resp: 15 17 13 17   SpO2: 97% 98% 99% 96%   Wt Readings from Last 3 Encounters:  04/16/15 99.791 kg (220 lb)  02/15/15 100.245 kg (221 lb)  12/21/14 100.699 kg (222 lb)    PHYSICAL EXAMINATION:  Physical Exam  Vitals reviewed. Constitutional: She is oriented to person, place, and time. She appears well-developed and well-nourished. No distress.  HENT:  Head: Normocephalic and atraumatic.  Mouth/Throat:  Oropharynx is clear and moist.  Eyes: Conjunctivae and EOM are normal. Pupils are equal, round, and reactive to light. No scleral icterus.  Neck: Normal range of motion. Neck supple. No JVD present. No thyromegaly present.  Cardiovascular: Normal rate, regular rhythm and intact distal pulses.  Exam reveals no gallop and no friction rub.   No murmur heard. Respiratory: Effort normal and breath sounds normal. No respiratory distress. She has no wheezes. She has no rales.  GI: Soft. Bowel sounds are normal. She exhibits no distension. There is no tenderness.  Musculoskeletal: Normal range of motion. She exhibits no edema.  No arthritis, no gout  Lymphadenopathy:    She has no cervical adenopathy.  Neurological: She is alert and oriented to person, place, and time. No cranial nerve deficit.  No dysarthria, no aphasia  Skin: Skin is warm  and dry. No rash noted. No erythema.  Psychiatric: She has a normal mood and affect. Her behavior is normal. Judgment and thought content normal.    LABORATORY PANEL:   CBC  Recent Labs Lab 04/24/15 2009  WBC 6.1  HGB 13.6  HCT 39.6  PLT 173   ------------------------------------------------------------------------------------------------------------------  Chemistries   Recent Labs Lab 04/24/15 2009  NA 133*  K 4.1  CL 99*  CO2 22  GLUCOSE 204*  BUN 14  CREATININE 0.80  CALCIUM 9.3   ------------------------------------------------------------------------------------------------------------------  Cardiac Enzymes  Recent Labs Lab 04/24/15 2009  TROPONINI 0.04*   ------------------------------------------------------------------------------------------------------------------  RADIOLOGY:  Ct Head Wo Contrast  04/24/2015  CLINICAL DATA:  75 year old female with dizziness, nausea and lightheadedness EXAM: CT HEAD WITHOUT CONTRAST TECHNIQUE: Contiguous axial images were obtained from the base of the skull through the vertex without  intravenous contrast. COMPARISON:  Prior head CT 04/16/2015 FINDINGS: Negative for acute intracranial hemorrhage, acute infarction, mass, mass effect, hydrocephalus or midline shift. Gray-white differentiation is preserved throughout. Remote lacunar infarct left basal ganglia. No focal soft tissue or calvarial abnormality. Globes and orbits symmetric and intact bilaterally. Normal aeration of the mastoid air cells and visualized paranasal sinuses. IMPRESSION: 1. Negative head CT. Electronically Signed   By: Jacqulynn Cadet M.D.   On: 04/24/2015 21:14    EKG:   Orders placed or performed during the hospital encounter of 04/24/15  . ED EKG  . ED EKG  . EKG 12-Lead  . EKG 12-Lead    IMPRESSION AND PLAN:  Principal Problem:   Elevated troponin - symptoms are unlikely due to ACS however, we will trend her cardiac enzymes tonight, and get an echocardiogram tomorrow. Will not order cardiology consult at this time, pending the above results. Active Problems:   Dizziness - suspect something more like vertigo, will have meclizine available tonight to see if it helps her.   Diabetes mellitus, type 2 ([place]) - siding scale insulin with corresponding glucose checks and carb modified diet   HTN (hypertension) - continue home meds   GERD (gastroesophageal reflux disease) - home dose PPI   HLD (hyperlipidemia) - continue home meds  All the records are reviewed and case discussed with ED provider. Management plans discussed with the patient and/or family.  DVT PROPHYLAXIS: SubQ lovenox  GI PROPHYLAXIS: PPI  ADMISSION STATUS: Inpatient  CODE STATUS: Full Code Status History    This patient does not have a recorded code status. Please follow your organizational policy for patients in this situation.      TOTAL TIME TAKING CARE OF THIS PATIENT: 45 minutes.    Sutton Plake FIELDING 04/25/2015, 12:58 AM  [facility]  Office  [phone]  CC: Primary care physician; Margarita Rana, MD

## 2015-04-25 NOTE — Telephone Encounter (Signed)
Pt wanted to let Dr. Venia Minks know that she had to cancel her appt this morning b/c she is in the hospital. Pt stated she is being treated for Vertigo and running test on her heart. Thanks TNP

## 2015-04-25 NOTE — Progress Notes (Addendum)
VSS. Zofran given once in am for nausea with improvement. Denies pain. Denies vertigo. Pt is being discharged home. Discharge papers given and explained to pt. Pt verbalized understanding. Follow up appointments and meds reviewed with pt. RX given to pt.

## 2015-04-25 NOTE — Evaluation (Signed)
Physical Therapy Evaluation Patient Details Name: Kathleen Campos MRN: [id number] DOB: 29-Aug-1940 Today's Date: 04/25/2015   History of Present Illness  Kathleen Campos is a 75 y.o. female who presents with dizziness with nausea and weakness. Patient states that her symptoms started in the afternoon, and that she initially just had the dizziness with some associated nausea without vomiting. However, afterwards she tried to get up and also felt extremely weak all over. She came in the ED for evaluation and was found to have a mildly elevated troponin 0.04. Hospitalists were called for admission. Pt denies recent falls  Clinical Impression  Pt presents with 2 separate bouts of acute onset dizziness with most recent episode including bout of vertigo that lasted 2 hours. Pt complains of severe nausea but has not vomited. In addition she complains that during the last episode she felt like she might "pass out." Troponins mildly elevated but trend is flat. Pt just completed echo study prior to PT evaluation. PT performed vestibular examination with patient. She reports feelings of bilateral hearing loss with R greater than the L. She denies prior history of episodes before last week. In addition she complains of aural fullness bilaterally with R sided tinnitus. No sensory or motor changes reported and strength/sensation are full with testing. Rapid alternating movements, finger to nose, and heel to shin normal. Pronator drift negative. Facial strength is symmetrical. No dysarthria. Ocular ROM is full without any observable nystagmus with the exception of age appropriate end range nystagmus. Smooth pursuit is somewhat saccadic. Horizontal and vertical saccade testing are WNL. VOR, VOR head thrust, and VOR cancellation all negative. Dix-Hallpike testing is negative bilaterally for symptoms and nystagmus. Vestibular examination is negative. Pt demonstrates good bed mobility, transfers, and ambulation. Head turns  do no provoke dizziness with ambulation and only very mild gait deviations. Pt encouraged to utilize single point cane as needed. Pt likely with some high level baseline balance deficits but not formally assessed and not impacting mobility. If cardiac and central causes are ruled out for cause of dizziness pt would benefit from referral for hearing and VNG testing with ENT as an outpatient. Differential includes Meniere's vs vestibular neuritis/labyrnthitis. No further PT needs identified at this time. Will sign off.       Follow Up Recommendations No PT follow up    Equipment Recommendations  Other (comment) (Intermittent use of single point cane per pt discretion)    Recommendations for Other Services       Precautions / Restrictions Precautions Precautions: Fall Restrictions Weight Bearing Restrictions: No      Mobility  Bed Mobility Overal bed mobility: Modified Independent             General bed mobility comments: Take slight extended time to perform but able to perform with HOB elevated and bed rails  Transfers Overall transfer level: Needs assistance Equipment used: None Transfers: Sit to/from Stand Sit to Stand: Supervision         General transfer comment: fair speed and sequencing noted. Good stability  Ambulation/Gait Ambulation/Gait assistance: Min guard Ambulation Distance (Feet): 150 Feet Assistive device: None Gait Pattern/deviations: Decreased step length - right;Decreased step length - left Gait velocity: WFL Gait velocity interpretation: >2.62 ft/sec, indicative of independent community ambulator General Gait Details: Pt started ambulation with rolling walker followed by IV pole and no assistive device. Pt demonstrates some lack of confidence initially but improves with distance. Pt able to perform horizontal and vertical head turns with very mild lateral gait  deviations but easily self corrected. Able to perform body turns and change gait speed upon  command.  Stairs            Wheelchair Mobility    Modified Rankin (Stroke Patients Only)       Balance Overall balance assessment: No apparent balance deficits (not formally assessed)                                           Pertinent Vitals/Pain Pain Assessment: No/denies pain (Denies chest pain)    Home Living Family/patient expects to be discharged to:: Private residence Living Arrangements: Spouse/significant other Available Help at Discharge: Family Type of Home: House Home Access: Stairs to enter Entrance Stairs-Rails: Left Entrance Stairs-Number of Steps: 5 Home Layout: One level Home Equipment: None (May have single piont cane but unsure, no walker)      Prior Function Level of Independence: Independent         Comments: Full community ambulator without assistive device. 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     Hand Dominance   Dominant Hand: Right    Extremity/Trunk Assessment   Upper Extremity Assessment: Overall WFL for tasks assessed           Lower Extremity Assessment: Overall WFL for tasks assessed         Communication   Communication: No difficulties  Cognition Arousal/Alertness: Awake/alert Behavior During Therapy: WFL for tasks assessed/performed Overall Cognitive Status: Within Functional Limits for tasks assessed                      General Comments      Exercises        Assessment/Plan    PT Assessment Patent does not need any further PT services  PT Diagnosis     PT Problem List    PT Treatment Interventions     PT Goals (Current goals can be found in the Care Plan section)      Frequency     Barriers to discharge        Co-evaluation               End of Session Equipment Utilized During Treatment: Gait belt Activity Tolerance: Patient tolerated treatment well Patient left: in bed;with call bell/phone within reach;with bed  alarm set;with family/visitor present Nurse Communication: Mobility status    Functional Assessment Tool Used: clinical judgement Functional Limitation: Mobility: Walking and moving around Mobility: Walking and Moving Around Current Status [id number]): 0 percent impaired, limited or restricted Mobility: Walking and Moving Around Goal Status [phone]): 0 percent impaired, limited or restricted Mobility: Walking and Moving Around Discharge Status [id number]): 0 percent impaired, limited or restricted    Time: 0955-1040 PT Time Calculation (min) (ACUTE ONLY): 45 min   Charges:   PT Evaluation $PT Eval Moderate Complexity: 1 Procedure PT Treatments $Gait Training: 8-22 mins   PT G Codes:   PT G-Codes **NOT FOR INPATIENT CLASS** Functional Assessment Tool Used: clinical judgement Functional Limitation: Mobility: Walking and moving around Mobility: Walking and Moving Around Current Status [id number]): 0 percent impaired, limited or restricted Mobility: Walking and Moving Around Goal Status [id number]): 0 percent impaired, limited or restricted Mobility: Walking and Moving Around Discharge Status [id number]): 0 percent impaired, limited or restricted   Phillips Grout PT, DPT   Campos,Kathleen  04/25/2015, 10:59 AM

## 2015-04-25 NOTE — Progress Notes (Signed)
*  PRELIMINARY RESULTS* Echocardiogram 2D Echocardiogram has been performed.  [name] 04/25/2015, 10:10 AM

## 2015-04-25 NOTE — Discharge Summary (Signed)
[facility] NAME: Kathleen Campos    MR#:  [id number]  DATE OF BIRTH:  09/20/1940  DATE OF ADMISSION:  04/24/2015 ADMITTING PHYSICIAN: Lance Coon, MD  DATE OF DISCHARGE: No discharge date for patient encounter.  PRIMARY CARE PHYSICIAN: Margarita Rana, MD    ADMISSION DIAGNOSIS:  Nausea [R11.0] Vertigo [R42] Weakness [R53.1]  DISCHARGE DIAGNOSIS:  Elevated troponin - cardiac cause ruled out Vertigo - resolved  SECONDARY DIAGNOSIS:   Past Medical History  Diagnosis Date  . GERD (gastroesophageal reflux disease)   . Diabetes mellitus without complication ([place])   . Hyperlipidemia   . Hypertension     HOSPITAL COURSE:  Kathleen Campos  is a 75 y.o. female admitted 04/24/2015 with chief complaint of dizziness. Please see H&P performed by Dr. Jannifer Franklin for further information. She originally presented with hospital with the above complaints. Noted to have elevated troponin subsequently placed on telemetry cardiac enzymes trended and returned within normal limits non cardiac cause. She symptomatically improved after some meclizine and IV fluid hydration and ambulated with physical therapy without difficulty discharged in stable condition   DISCHARGE CONDITIONS:   Stable/improved  CONSULTS OBTAINED:     DRUG ALLERGIES:   Allergies  Allergen Reactions  . Biaxin [Clarithromycin] Nausea And Vomiting  . Codeine Nausea And Vomiting  . Lisinopril Cough  . Morphine Nausea And Vomiting  . Ciprofloxacin Itching, Nausea Only and Rash    DISCHARGE MEDICATIONS:   Current Discharge Medication List    START taking these medications   Details  !! ondansetron (ZOFRAN) 4 MG tablet Take 1 tablet (4 mg total) by mouth every 6 (six) hours as needed for nausea. Qty: 20 tablet, Refills: 0     !! - Potential duplicate medications found. Please discuss with provider.    CONTINUE these medications which have CHANGED   Details   meclizine (ANTIVERT) 25 MG tablet Take 1 tablet (25 mg total) by mouth 3 (three) times daily as needed for dizziness or nausea. Qty: 30 tablet, Refills: 0      CONTINUE these medications which have NOT CHANGED   Details  aspirin 81 MG tablet Take 81 mg by mouth daily.     Blood Glucose Monitoring Suppl (TRUE METRIX AIR GLUCOSE METER) DEVI 1 Device by Does not apply route daily. To check blood sugar once a day. Qty: 1 Device, Refills: 0   Associated Diagnoses: Type 2 diabetes mellitus without complication (HCC)    calcium-vitamin D (OSCAL WITH D) 500-200 MG-UNIT per tablet Take 1 tablet by mouth 2 (two) times daily.     Cholecalciferol 1000 UNITS capsule Take 1,000 Units by mouth daily.     cyanocobalamin 1000 MCG tablet Take 1,000 mcg by mouth daily.     glipiZIDE (GLIPIZIDE XL) 5 MG 24 hr tablet Take 1 tablet (5 mg total) by mouth daily with breakfast. Qty: 90 tablet, Refills: 3   Associated Diagnoses: Type 2 diabetes mellitus without complication (HCC)    glucose blood test strip To check blood sugar once a day. Qty: 100 each, Refills: 3   Associated Diagnoses: Type 2 diabetes mellitus without complication (HCC)    hydrochlorothiazide (MICROZIDE) 12.5 MG capsule Take 12.5 mg by mouth daily.    metFORMIN (GLUCOPHAGE) 1000 MG tablet Take 1,000 mg by mouth 2 (two) times daily with a meal.    naproxen sodium (ANAPROX) 220 MG tablet Take 220 mg by mouth 2 (two) times daily as needed (pain).  omeprazole (PRILOSEC) 20 MG capsule Take 20 mg by mouth daily as needed (acid reflux).     !! ondansetron (ZOFRAN) 4 MG tablet Take 1 tablet (4 mg total) by mouth every 8 (eight) hours as needed for nausea or vomiting. Qty: 10 tablet, Refills: 0    pioglitazone (ACTOS) 30 MG tablet Take 30 mg by mouth daily.    potassium chloride SA (K-DUR,KLOR-CON) 20 MEQ tablet Take 20 mEq by mouth daily.    simvastatin (ZOCOR) 20 MG tablet Take 20 mg by mouth at bedtime.    timolol (TIMOPTIC) 0.5 %  ophthalmic solution Place 1 drop into both eyes daily.     TRUEPLUS LANCETS 28G MISC To check blood sugar once a day. Qty: 100 each, Refills: 3   Associated Diagnoses: Type 2 diabetes mellitus without complication (HCC)    valsartan (DIOVAN) 80 MG tablet Take 80 mg by mouth daily.     !! - Potential duplicate medications found. Please discuss with provider.       DISCHARGE INSTRUCTIONS:    DIET:  Diabetic diet  DISCHARGE CONDITION:  Good  ACTIVITY:  Activity as tolerated  OXYGEN:  Home Oxygen: No.   Oxygen Delivery: room air  DISCHARGE LOCATION:  home   If you experience worsening of your admission symptoms, develop shortness of breath, life threatening emergency, suicidal or homicidal thoughts you must seek medical attention immediately by calling 911 or calling your MD immediately  if symptoms less severe.  You Must read complete instructions/literature along with all the possible adverse reactions/side effects for all the Medicines you take and that have been prescribed to you. Take any new Medicines after you have completely understood and accpet all the possible adverse reactions/side effects.   Please note  You were cared for by a hospitalist during your hospital stay. If you have any questions about your discharge medications or the care you received while you were in the hospital after you are discharged, you can call the unit and asked to speak with the hospitalist on call if the hospitalist that took care of you is not available. Once you are discharged, your primary care physician will handle any further medical issues. Please note that NO REFILLS for any discharge medications will be authorized once you are discharged, as it is imperative that you return to your primary care physician (or establish a relationship with a primary care physician if you do not have one) for your aftercare needs so that they can reassess your need for medications and monitor your lab  values.    On the day of Discharge:   VITAL SIGNS:  Blood pressure 131/53, pulse 71, temperature 98.4 F (36.9 C), temperature source Oral, resp. rate 18, height 5\' 7"  (1.702 m), weight 98.294 kg (216 lb 11.2 oz), SpO2 100 %.  I/O:   Intake/Output Summary (Last 24 hours) at 04/25/15 1230 Last data filed at 04/25/15 0739  Gross per 24 hour  Intake 458.75 ml  Output    980 ml  Net -521.25 ml    PHYSICAL EXAMINATION:  GENERAL:  75 y.o.-year-old patient lying in the bed with no acute distress.  EYES: Pupils equal, round, reactive to light and accommodation. No scleral icterus. Extraocular muscles intact.  HEENT: Head atraumatic, normocephalic. Oropharynx and nasopharynx clear.  NECK:  Supple, no jugular venous distention. No thyroid enlargement, no tenderness.  LUNGS: Normal breath sounds bilaterally, no wheezing, rales,rhonchi or crepitation. No use of accessory muscles of respiration.  CARDIOVASCULAR: S1, S2 normal. No  murmurs, rubs, or gallops.  ABDOMEN: Soft, non-tender, non-distended. Bowel sounds present. No organomegaly or mass.  EXTREMITIES: No pedal edema, cyanosis, or clubbing.  NEUROLOGIC: Cranial nerves II through XII are intact. Muscle strength 5/5 in all extremities. Sensation intact. Gait not checked.  PSYCHIATRIC: The patient is alert and oriented x 3.  SKIN: No obvious rash, lesion, or ulcer.   DATA REVIEW:   CBC  Recent Labs Lab 04/25/15 0806  WBC 5.4  HGB 13.3  HCT 39.0  PLT 164    Chemistries   Recent Labs Lab 04/25/15 0806  NA 139  K 4.2  CL 103  CO2 27  GLUCOSE 138*  BUN 12  CREATININE 0.73  CALCIUM 9.0    Cardiac Enzymes  Recent Labs Lab 04/25/15 0806  TROPONINI <0.03    Microbiology Results  Results for orders placed or performed in visit on 10/16/14  Urine culture     Status: None   Collection Time: 10/16/14 12:19 PM  Result Value Ref Range Status   Urine Culture, Routine Final report  Final   Urine Culture result 1  Comment  Final    Comment: Mixed urogenital flora Less than 10,000 colonies/mL     RADIOLOGY:  Ct Head Wo Contrast  04/24/2015  CLINICAL DATA:  75 year old female with dizziness, nausea and lightheadedness EXAM: CT HEAD WITHOUT CONTRAST TECHNIQUE: Contiguous axial images were obtained from the base of the skull through the vertex without intravenous contrast. COMPARISON:  Prior head CT 04/16/2015 FINDINGS: Negative for acute intracranial hemorrhage, acute infarction, mass, mass effect, hydrocephalus or midline shift. Gray-white differentiation is preserved throughout. Remote lacunar infarct left basal ganglia. No focal soft tissue or calvarial abnormality. Globes and orbits symmetric and intact bilaterally. Normal aeration of the mastoid air cells and visualized paranasal sinuses. IMPRESSION: 1. Negative head CT. Electronically Signed   By: Jacqulynn Cadet M.D.   On: 04/24/2015 21:14     Management plans discussed with the patient, family and they are in agreement.  CODE STATUS:     Code Status Orders        Start     Ordered   04/25/15 0100  Full code   Continuous     04/25/15 0059    Code Status History    Date Active Date Inactive Code Status Order ID Comments User Context   This patient has a current code status but no historical code status.      TOTAL TIME TAKING CARE OF THIS PATIENT:   minutes.  SalisburyD on 04/25/2015 at 12:30 PM  Between 7am to 6pm - Pager - [phone]  After 6pm go to [email or web address] - password [email or web address] [facility]  [facility]  Office  [phone]  CC: Primary care physician; Margarita Rana, MD

## 2015-04-27 ENCOUNTER — Other Ambulatory Visit: Payer: Self-pay | Admitting: Family Medicine

## 2015-04-27 DIAGNOSIS — E119 Type 2 diabetes mellitus without complications: Secondary | ICD-10-CM

## 2015-05-02 ENCOUNTER — Ambulatory Visit (INDEPENDENT_AMBULATORY_CARE_PROVIDER_SITE_OTHER): Payer: Medicare PPO | Admitting: Family Medicine

## 2015-05-02 ENCOUNTER — Encounter: Payer: Self-pay | Admitting: Family Medicine

## 2015-05-02 VITALS — BP 116/72 | HR 72 | Temp 98.4°F | Resp 16 | Wt 220.0 lb

## 2015-05-02 DIAGNOSIS — I10 Essential (primary) hypertension: Secondary | ICD-10-CM

## 2015-05-02 DIAGNOSIS — E119 Type 2 diabetes mellitus without complications: Secondary | ICD-10-CM | POA: Diagnosis not present

## 2015-05-02 DIAGNOSIS — R110 Nausea: Secondary | ICD-10-CM

## 2015-05-02 DIAGNOSIS — K219 Gastro-esophageal reflux disease without esophagitis: Secondary | ICD-10-CM | POA: Diagnosis not present

## 2015-05-02 NOTE — Progress Notes (Signed)
Subjective:    Patient ID: Kathleen Campos, female    DOB: 1940-03-18, 75 y.o.   MRN: [id number]  HPI   Follow up Hospitalization  Patient was admitted to [facility] on 04/24/2015 and discharged on 04/25/2015. She was treated for vertigo. Treatment for this included Zofran 4 mg, Meclizine 35 mg. EKG, labs and head CT performed in ED. She reports good compliance with treatment. She reports this condition is Improved. Pt states her dizziness is 80% improved, but is still c/o lightheadedness and unsteadiness. Pt's main complaint today is nausea. Nausea is the main symptoms now. No dizziness with head turning.   Pt is taking Zofran 4 mg, with little relief.  Has had no change in chronic medication.  No neurologic symptoms.  No chest pain or other symptoms today.   Reviewed ER records ( 2 visits).   ------------------------------------------------------------------------------------    Review of Systems  Constitutional: Positive for chills, diaphoresis, appetite change and fatigue. Negative for fever and activity change.  Respiratory: Negative for cough, shortness of breath and wheezing.   Cardiovascular: Negative for chest pain, palpitations and leg swelling.  Gastrointestinal: Positive for nausea. Negative for vomiting.  Neurological: Positive for dizziness, weakness and light-headedness.   BP 116/72 mmHg  Pulse 72  Temp(Src) 98.4 F (36.9 C) (Oral)  Resp 16  Wt 220 lb (99.791 kg)  SpO2 98%   Patient Active Problem List   Diagnosis Date Noted  . GERD (gastroesophageal reflux disease) 04/24/2015  . HLD (hyperlipidemia) 04/24/2015  . Dizziness 04/24/2015  . Elevated troponin 04/24/2015  . Otitis externa 12/21/2014  . Allergic rhinitis 07/18/2014  . Colon polyp 07/18/2014  . Acid reflux 07/18/2014  . Hypercholesteremia 07/18/2014  . Low serum cobalamin 07/18/2014  . Adult BMI 30+ 07/18/2014  . Avitaminosis D 07/18/2014  . Diabetes mellitus, type 2 ([place]) 09/09/1998  . AG  (atrophic gastritis) 03/06/1998  . HTN (hypertension) 06/04/1994   Past Medical History  Diagnosis Date  . GERD (gastroesophageal reflux disease)   . Diabetes mellitus without complication ([place])   . Hyperlipidemia   . Hypertension    Current Outpatient Prescriptions on File Prior to Visit  Medication Sig  . aspirin 81 MG tablet Take 81 mg by mouth daily.   . Blood Glucose Monitoring Suppl (TRUE METRIX AIR GLUCOSE METER) DEVI 1 Device by Does not apply route daily. To check blood sugar once a day.  . calcium-vitamin D (OSCAL WITH D) 500-200 MG-UNIT per tablet Take 1 tablet by mouth 2 (two) times daily.   . Cholecalciferol 1000 UNITS capsule Take 1,000 Units by mouth daily.   . cyanocobalamin 1000 MCG tablet Take 1,000 mcg by mouth daily.   Marland Kitchen glipiZIDE (GLIPIZIDE XL) 5 MG 24 hr tablet Take 1 tablet (5 mg total) by mouth daily with breakfast.  . glucose blood (TRUE METRIX BLOOD GLUCOSE TEST) test strip Use to check blood sugar once a day.  DX: E11.9.  Marland Kitchen hydrochlorothiazide (MICROZIDE) 12.5 MG capsule Take 12.5 mg by mouth daily.  . meclizine (ANTIVERT) 25 MG tablet Take 1 tablet (25 mg total) by mouth 3 (three) times daily as needed for dizziness or nausea.  . metFORMIN (GLUCOPHAGE) 1000 MG tablet Take 1,000 mg by mouth 2 (two) times daily with a meal.  . naproxen sodium (ANAPROX) 220 MG tablet Take 220 mg by mouth 2 (two) times daily as needed (pain).   Marland Kitchen omeprazole (PRILOSEC) 20 MG capsule Take 1 capsule (20 mg total) by mouth daily.  . ondansetron (ZOFRAN)  4 MG tablet Take 1 tablet (4 mg total) by mouth every 8 (eight) hours as needed for nausea or vomiting.  . pioglitazone (ACTOS) 30 MG tablet Take 30 mg by mouth daily.  . potassium chloride SA (K-DUR,KLOR-CON) 20 MEQ tablet Take 20 mEq by mouth daily.  . simvastatin (ZOCOR) 20 MG tablet Take 20 mg by mouth at bedtime.  . timolol (TIMOPTIC) 0.5 % ophthalmic solution Place 1 drop into both eyes daily.   . TRUEPLUS LANCETS 28G MISC To  check blood sugar once a day.  . valsartan (DIOVAN) 80 MG tablet Take 80 mg by mouth daily.   No current facility-administered medications on file prior to visit.   Allergies  Allergen Reactions  . Biaxin [Clarithromycin] Nausea And Vomiting  . Codeine Nausea And Vomiting  . Lisinopril Cough  . Morphine Nausea And Vomiting  . Ciprofloxacin Itching, Nausea Only and Rash   Past Surgical History  Procedure Laterality Date  . Cervical biopsy  w/ loop electrode excision  2010  . Cholecystectomy  1986  . Back surgery  2003   Social History   Social History  . Marital Status: Married    Spouse Name: Kyung Rudd  . Number of Children: 3  . Years of Education: 12   Occupational History  . private caregiver    Social History Main Topics  . Smoking status: Former Smoker    Quit date: 07/17/1984  . Smokeless tobacco: Never Used  . Alcohol Use: No  . Drug Use: No  . Sexual Activity: Not on file   Other Topics Concern  . Not on file   Social History Narrative   Family History  Problem Relation Age of Onset  . Hypertension Mother   . Heart disease Mother   . Lung cancer Father   . Heart disease Sister   . Hypertension Sister   . Hypertension Brother   . Diabetes Brother        Objective:   Physical Exam  Constitutional: She is oriented to person, place, and time. She appears well-developed and well-nourished.  HENT:  Head: Normocephalic and atraumatic.  Eyes: Conjunctivae and EOM are normal. Pupils are equal, round, and reactive to light.  Neck: Normal range of motion. Neck supple.  Cardiovascular: Normal rate and regular rhythm.   Pulmonary/Chest: Effort normal and breath sounds normal.  Abdominal: Soft. Bowel sounds are normal.  Neurological: She is alert and oriented to person, place, and time. No cranial nerve deficit.  Psychiatric: She has a normal mood and affect. Her behavior is normal. Judgment and thought content normal.   BP 116/72 mmHg  Pulse 72   Temp(Src) 98.4 F (36.9 C) (Oral)  Resp 16  Wt 220 lb (99.791 kg)  SpO2 98%     Assessment & Plan:  1. Essential hypertension Low today. But does not have any change in symptoms with standing. Was high when first sick. Will not change today.    2. Gastroesophageal reflux disease without esophagitis No symptoms today.  Continue current medication.   3. Type 2 diabetes mellitus without complication, without long-term current use of insulin (HCC) Stable.   4. Nausea New problem. Try to stop Meclizine.  Continue Zofran.  Consider GI referral if does not improve.  Does not have her gallbladder.  Nausea slightly worse with eating, not movement.   Patient instructed to call back if condition worsens or does not improve.     Patient was seen and examined by Jerrell Belfast, MD, and note  scribed by Renaldo Fiddler, CMA. I have reviewed the document for accuracy and completeness and I agree with above. Jerrell Belfast, MD   Margarita Rana, MD

## 2015-05-06 ENCOUNTER — Telehealth: Payer: Self-pay | Admitting: Family Medicine

## 2015-05-06 DIAGNOSIS — K219 Gastro-esophageal reflux disease without esophagitis: Secondary | ICD-10-CM

## 2015-05-06 DIAGNOSIS — R110 Nausea: Secondary | ICD-10-CM

## 2015-05-06 NOTE — Telephone Encounter (Signed)
Pt stated that she would like to go ahead with the GI referral as discussed at last OV on 05/02/15. Thanks TNP

## 2015-05-31 ENCOUNTER — Ambulatory Visit (INDEPENDENT_AMBULATORY_CARE_PROVIDER_SITE_OTHER): Payer: Medicare PPO | Admitting: Family Medicine

## 2015-05-31 ENCOUNTER — Encounter: Payer: Self-pay | Admitting: Family Medicine

## 2015-05-31 VITALS — BP 116/72 | HR 80 | Temp 98.1°F | Resp 16 | Wt 217.0 lb

## 2015-05-31 DIAGNOSIS — E119 Type 2 diabetes mellitus without complications: Secondary | ICD-10-CM

## 2015-05-31 DIAGNOSIS — I10 Essential (primary) hypertension: Secondary | ICD-10-CM | POA: Diagnosis not present

## 2015-05-31 DIAGNOSIS — H6983 Other specified disorders of Eustachian tube, bilateral: Secondary | ICD-10-CM

## 2015-05-31 LAB — POCT GLYCOSYLATED HEMOGLOBIN (HGB A1C)
ESTIMATED AVERAGE GLUCOSE: 146
Hemoglobin A1C: 6.7

## 2015-05-31 LAB — GLUCOSE, POCT (MANUAL RESULT ENTRY): POC GLUCOSE: 157 mg/dL — AB (ref 70–99)

## 2015-05-31 MED ORDER — FLUTICASONE PROPIONATE 50 MCG/ACT NA SUSP: 2.0000 | Freq: Every day | NASAL | Status: DC

## 2015-05-31 NOTE — Progress Notes (Signed)
Subjective:    Patient ID: Kathleen Campos, female    DOB: [birth year], 75 y.o.   MRN: [id number]  Diabetes She presents for her follow-up diabetic visit. She has type 2 diabetes mellitus. Hypoglycemia symptoms include dizziness (lightheaded) and nervousness/anxiousness. ("once in a blue moon") Associated symptoms include fatigue, foot paresthesias (unchanged) and weakness. Pertinent negatives for diabetes include no blurred vision, no chest pain, no foot ulcerations, no polydipsia, no polyphagia, no polyuria and no visual change. Symptoms are stable. Risk factors for coronary artery disease include diabetes mellitus, dyslipidemia, hypertension, obesity, post-menopausal and family history. Current diabetic treatment includes oral agent (triple therapy) (Glipizide, Metformin, Actos). She is compliant with treatment all of the time. She is following a generally healthy diet. She never ("I just don't feel strong enogh") participates in exercise. Her home blood glucose trend is decreasing steadily. An ACE inhibitor/angiotensin II receptor blocker is being taken. Eye exam is current.  Hypertension This is a chronic problem. The problem is controlled. Associated symptoms include malaise/fatigue and shortness of breath (on exertion). Pertinent negatives include no blurred vision, chest pain, neck pain, orthopnea, palpitations or peripheral edema. Treatments tried: currently taking HCTZ and Valsartan. The current treatment provides significant improvement. There are no compliance problems.       Review of Systems  Constitutional: Positive for malaise/fatigue, appetite change (due to nausea) and fatigue.  HENT: Positive for hearing loss.   Eyes: Negative for blurred vision.  Respiratory: Positive for shortness of breath (on exertion).   Cardiovascular: Negative for chest pain, palpitations and orthopnea.  Gastrointestinal: Positive for nausea (seeing GI on 06/18/2015).  Endocrine: Negative for  polydipsia, polyphagia and polyuria.  Musculoskeletal: Negative for neck pain.  Neurological: Positive for dizziness (lightheaded) and weakness.  Psychiatric/Behavioral: The patient is nervous/anxious.    BP 116/72 mmHg  Pulse 80  Temp(Src) 98.1 F (36.7 C) (Oral)  Resp 16  Wt 217 lb (98.431 kg)   Patient Active Problem List   Diagnosis Date Noted  . Nausea 05/02/2015  . GERD (gastroesophageal reflux disease) 04/24/2015  . HLD (hyperlipidemia) 04/24/2015  . Dizziness 04/24/2015  . Elevated troponin 04/24/2015  . Otitis externa 12/21/2014  . Allergic rhinitis 07/18/2014  . Colon polyp 07/18/2014  . Acid reflux 07/18/2014  . Hypercholesteremia 07/18/2014  . Low serum cobalamin 07/18/2014  . Adult BMI 30+ 07/18/2014  . Avitaminosis D 07/18/2014  . Diabetes mellitus, type 2 ([place]) 09/09/1998  . AG (atrophic gastritis) 03/06/1998  . HTN (hypertension) 06/04/1994   Past Medical History  Diagnosis Date  . GERD (gastroesophageal reflux disease)   . Diabetes mellitus without complication ([place])   . Hyperlipidemia   . Hypertension    Current Outpatient Prescriptions on File Prior to Visit  Medication Sig  . aspirin 81 MG tablet Take 81 mg by mouth daily.   . Blood Glucose Monitoring Suppl (TRUE METRIX AIR GLUCOSE METER) DEVI 1 Device by Does not apply route daily. To check blood sugar once a day.  . calcium-vitamin D (OSCAL WITH D) 500-200 MG-UNIT per tablet Take 1 tablet by mouth 2 (two) times daily.   . Cholecalciferol 1000 UNITS capsule Take 1,000 Units by mouth daily.   . cyanocobalamin 1000 MCG tablet Take 1,000 mcg by mouth daily.   Marland Kitchen glipiZIDE (GLIPIZIDE XL) 5 MG 24 hr tablet Take 1 tablet (5 mg total) by mouth daily with breakfast.  . glucose blood (TRUE METRIX BLOOD GLUCOSE TEST) test strip Use to check blood sugar once a day.  DX: E11.9.  Marland Kitchen  hydrochlorothiazide (MICROZIDE) 12.5 MG capsule Take 12.5 mg by mouth daily.  . metFORMIN (GLUCOPHAGE) 1000 MG tablet Take 1,000 mg  by mouth 2 (two) times daily with a meal.  . naproxen sodium (ANAPROX) 220 MG tablet Take 220 mg by mouth 2 (two) times daily as needed (pain).   Marland Kitchen omeprazole (PRILOSEC) 20 MG capsule Take 1 capsule (20 mg total) by mouth daily.  . pioglitazone (ACTOS) 30 MG tablet Take 30 mg by mouth daily.  . potassium chloride SA (K-DUR,KLOR-CON) 20 MEQ tablet Take 20 mEq by mouth daily.  . simvastatin (ZOCOR) 20 MG tablet Take 20 mg by mouth at bedtime.  . timolol (TIMOPTIC) 0.5 % ophthalmic solution Place 1 drop into both eyes daily.   . TRUEPLUS LANCETS 28G MISC To check blood sugar once a day.  . valsartan (DIOVAN) 80 MG tablet Take 80 mg by mouth daily.   No current facility-administered medications on file prior to visit.   Allergies  Allergen Reactions  . Biaxin [Clarithromycin] Nausea And Vomiting  . Codeine Nausea And Vomiting  . Lisinopril Cough  . Morphine Nausea And Vomiting  . Ciprofloxacin Itching, Nausea Only and Rash   Past Surgical History  Procedure Laterality Date  . Cervical biopsy  w/ loop electrode excision  2010  . Cholecystectomy  1986  . Back surgery  2003   Social History   Social History  . Marital Status: Married    Spouse Name: Kyung Rudd  . Number of Children: 3  . Years of Education: 12   Occupational History  . private caregiver    Social History Main Topics  . Smoking status: Former Smoker    Quit date: 07/17/1984  . Smokeless tobacco: Never Used  . Alcohol Use: No  . Drug Use: No  . Sexual Activity: Not on file   Other Topics Concern  . Not on file   Social History Narrative   Family History  Problem Relation Age of Onset  . Hypertension Mother   . Heart disease Mother   . Lung cancer Father   . Heart disease Sister   . Hypertension Sister   . Hypertension Brother   . Diabetes Brother        Objective:   Physical Exam  Constitutional: She appears well-developed and well-nourished.  HENT:  Right Ear: Tympanic membrane, external ear and  ear canal normal. No decreased hearing is noted.  Left Ear: Tympanic membrane, external ear and ear canal normal. No decreased hearing is noted.  Nose: Nose normal.  Skin: She is diaphoretic.  Psychiatric: She has a normal mood and affect. Her behavior is normal.  BP 116/72 mmHg  Pulse 80  Temp(Src) 98.1 F (36.7 C) (Oral)  Resp 16  Wt 217 lb (98.431 kg)  Orthostatic VS for the past 24 hrs:  BP- Lying Pulse- Lying BP- Sitting Pulse- Sitting BP- Standing at 0 minutes Pulse- Standing at 0 minutes  05/31/15 0856 124/82 mmHg 67 128/82 mmHg 76 132/78 mmHg 80         Assessment & Plan:  1. Type 2 diabetes mellitus without complication, without long-term current use of insulin (HCC) Stable. Pt c/o dizziness and nausea. Will D/C Metformin to see if GI sx improve. FU 3 months. If does not improve before follow up, will make earlier appointment.  - POCT glycosylated hemoglobin (Hb A1C) - POCT glucose (manual entry)  Results for orders placed or performed in visit on 05/31/15  POCT glycosylated hemoglobin (Hb A1C)  Result Value Ref  Range   Hemoglobin A1C 6.7    Est. average glucose Bld gHb Est-mCnc 146   POCT glucose (manual entry)  Result Value Ref Range   POC Glucose 157 (A) 70 - 99 mg/dl     2. Essential hypertension Stable. Continue current medications and plan of care.  3. Eustachian tube dysfunction, bilateral New problem. Possibly the cause of dizziness. Treat with steroid nasal spray as below. Orthostatics negative today. Call if worsens or does not improve before follow up.   - fluticasone (FLONASE) 50 MCG/ACT nasal spray; Place 2 sprays into both nostrils daily.  Dispense: 48 g; Refill: 3   Patient seen and examined by Jerrell Belfast, MD, and note scribed by Renaldo Fiddler, CMA.  I have reviewed the document for accuracy and completeness and I agree with above. Jerrell Belfast, MD  Margarita Rana, MD   Margarita Rana, MD

## 2015-06-03 ENCOUNTER — Other Ambulatory Visit: Payer: Self-pay | Admitting: Family Medicine

## 2015-06-03 DIAGNOSIS — H6983 Other specified disorders of Eustachian tube, bilateral: Secondary | ICD-10-CM

## 2015-06-03 MED ORDER — FLUTICASONE PROPIONATE 50 MCG/ACT NA SUSP: 2.0000 | Freq: Every day | NASAL | Status: DC

## 2015-06-03 NOTE — Telephone Encounter (Signed)
Pt contacted office for refill request on the following medications: fluticasone (FLONASE) 50 MCG/ACT nasal spray to [employer] [facility]. On 05/31/15 it was sent to [facility] and pt would like it sent to [facility]. Thanks TNP

## 2015-06-18 ENCOUNTER — Encounter: Payer: Self-pay | Admitting: Gastroenterology

## 2015-06-18 ENCOUNTER — Encounter (INDEPENDENT_AMBULATORY_CARE_PROVIDER_SITE_OTHER): Payer: Self-pay

## 2015-06-18 ENCOUNTER — Ambulatory Visit (INDEPENDENT_AMBULATORY_CARE_PROVIDER_SITE_OTHER): Payer: Medicare PPO | Admitting: Gastroenterology

## 2015-06-18 ENCOUNTER — Telehealth: Payer: Self-pay | Admitting: Family Medicine

## 2015-06-18 VITALS — BP 163/80 | HR 80 | Temp 97.9°F | Wt 221.0 lb

## 2015-06-18 DIAGNOSIS — R110 Nausea: Secondary | ICD-10-CM

## 2015-06-18 DIAGNOSIS — R42 Dizziness and giddiness: Secondary | ICD-10-CM

## 2015-06-18 NOTE — Telephone Encounter (Signed)
Pt informed and voiced understanding of results. Referral made.

## 2015-06-18 NOTE — Progress Notes (Signed)
Gastroenterology Consultation  Referring Provider:     Margarita Rana, MD Primary Care Physician:  Margarita Rana, MD Primary Gastroenterologist:  Dr. Allen Norris     Reason for Consultation:     Nausea        HPI:   Kathleen Campos is a 75 y.o. y/o female referred for consultation & management of Nausea by Dr. Margarita Rana, MD.  This patient comes in today with a history of a visit to the emergency room for vertigo with instability dizziness and nausea. The patient reports that she also has intermittent abdominal pain that usually happens about once or twice a month. The patient was told to stop her metformin and all of her nausea has gone away. The patient has had no further nausea for the last 2 weeks. There is no report of any vomiting. There is no history of black stools or bloody stools. The patient reports that she was given a stool test that she reports to have been on TV with the patient stating that she was told it is as good as a colonoscopy. She reports the test to have been negative. She continues to have some vertigo and some unsteadiness. No report of any change in bowel habits and she reports that she usually has a day consisting of 2-3 bowel movements every day.  Past Medical History  Diagnosis Date  . GERD (gastroesophageal reflux disease)   . Diabetes mellitus without complication ([place])   . Hyperlipidemia   . Hypertension     Past Surgical History  Procedure Laterality Date  . Cervical biopsy  w/ loop electrode excision  2010  . Cholecystectomy  1986  . Back surgery  2003    Prior to Admission medications   Medication Sig Start Date End Date Taking? Authorizing Provider  aspirin 81 MG tablet Take 81 mg by mouth daily.  10/04/07  Yes Historical Provider, MD  Blood Glucose Monitoring Suppl (TRUE METRIX AIR GLUCOSE METER) DEVI 1 Device by Does not apply route daily. To check blood sugar once a day. 08/20/14  Yes Margarita Rana, MD  Blood Glucose Monitoring Suppl (TRUE  METRIX AIR GLUCOSE METER) w/Device KIT  05/11/15  Yes Historical Provider, MD  calcium-vitamin D (OSCAL WITH D) 500-200 MG-UNIT per tablet Take 1 tablet by mouth 2 (two) times daily.  10/04/07  Yes Historical Provider, MD  Cholecalciferol 1000 UNITS capsule Take 1,000 Units by mouth daily.    Yes Historical Provider, MD  cyanocobalamin 1000 MCG tablet Take 1,000 mcg by mouth daily.    Yes Historical Provider, MD  fluticasone (FLONASE) 50 MCG/ACT nasal spray Place 2 sprays into both nostrils daily. 06/03/15  Yes Margarita Rana, MD  glipiZIDE (GLIPIZIDE XL) 5 MG 24 hr tablet Take 1 tablet (5 mg total) by mouth daily with breakfast. 09/19/14  Yes Margarita Rana, MD  glucose blood (TRUE METRIX BLOOD GLUCOSE TEST) test strip Use to check blood sugar once a day.  DX: E11.9. 04/29/15  Yes Margarita Rana, MD  hydrochlorothiazide (MICROZIDE) 12.5 MG capsule Take 12.5 mg by mouth daily.   Yes Historical Provider, MD  metFORMIN (GLUCOPHAGE) 1000 MG tablet Take 1 tablet by mouth 1 day or 1 dose. 05/11/15  Yes Historical Provider, MD  naproxen sodium (ANAPROX) 220 MG tablet Take 220 mg by mouth 2 (two) times daily as needed (pain).    Yes Historical Provider, MD  omeprazole (PRILOSEC) 20 MG capsule Take 1 capsule (20 mg total) by mouth daily. 04/25/15  Yes Shanon Brow  K Hower, MD  pioglitazone (ACTOS) 30 MG tablet Take 30 mg by mouth daily.   Yes Historical Provider, MD  potassium chloride SA (K-DUR,KLOR-CON) 20 MEQ tablet Take 20 mEq by mouth daily.   Yes Historical Provider, MD  simvastatin (ZOCOR) 20 MG tablet Take 20 mg by mouth at bedtime.   Yes Historical Provider, MD  timolol (TIMOPTIC) 0.5 % ophthalmic solution Place 1 drop into both eyes daily.  11/13/14  Yes Historical Provider, MD  TRUEPLUS LANCETS 28G MISC To check blood sugar once a day. 08/20/14  Yes Margarita Rana, MD  valsartan (DIOVAN) 80 MG tablet Take 80 mg by mouth daily.   Yes Historical Provider, MD    Family History  Problem Relation Age of Onset  .  Hypertension Mother   . Heart disease Mother   . Lung cancer Father   . Heart disease Sister   . Hypertension Sister   . Hypertension Brother   . Diabetes Brother      Social History  Substance Use Topics  . Smoking status: Former Smoker    Quit date: 07/17/1984  . Smokeless tobacco: Never Used  . Alcohol Use: No    Allergies as of 06/18/2015 - Review Complete 06/18/2015  Allergen Reaction Noted  . Biaxin [clarithromycin] Nausea And Vomiting 07/18/2014  . Codeine Nausea And Vomiting 07/18/2014  . Lisinopril Cough 07/18/2014  . Morphine Nausea And Vomiting 07/18/2014  . Ciprofloxacin Itching, Nausea Only, and Rash 07/18/2014    Review of Systems:    All systems reviewed and negative except where noted in HPI.   Physical Exam:  BP 163/80 mmHg  Pulse 80  Temp(Src) 97.9 F (36.6 C) (Oral)  Wt 221 lb (100.245 kg) No LMP recorded. Patient is postmenopausal. Psych:  Alert and cooperative. Normal mood and affect. General:   Alert,  Well-developed, well-nourished, pleasant and cooperative in NAD Head:  Normocephalic and atraumatic. Eyes:  Sclera clear, no icterus.   Conjunctiva pink. Ears:  Normal auditory acuity. Nose:  No deformity, discharge, or lesions. Mouth:  No deformity or lesions,oropharynx pink & moist. Neck:  Supple; no masses or thyromegaly. Lungs:  Respirations even and unlabored.  Clear throughout to auscultation.   No wheezes, crackles, or rhonchi. No acute distress. Heart:  Regular rate and rhythm; no murmurs, clicks, rubs, or gallops. Abdomen:  Normal bowel sounds.  No bruits.  Soft, non-tender and non-distended without masses, hepatosplenomegaly or hernias noted.  No guarding or rebound tenderness.  Negative Carnett sign.   Rectal:  Deferred.  Msk:  Symmetrical without gross deformities.  Good, equal movement & strength bilaterally. Pulses:  Normal pulses noted. Extremities:  No clubbing or edema.  No cyanosis. Neurologic:  Alert and oriented x3;  grossly  normal neurologically. Skin:  Intact without significant lesions or rashes.  No jaundice. Lymph Nodes:  No significant cervical adenopathy. Psych:  Alert and cooperative. Normal mood and affect.  Imaging Studies: No results found.  Assessment and Plan:   Kathleen Campos is a 75 y.o. y/o female who comes in today with a history of nausea that stopped after she stopped her metformin. The patient has been well without any nausea for at least 2 weeks. She states that it was much more frequent when she was on the metformin. She reports that she had a colon test that she is under the impression was is good is a colonoscopy and thinks that it was the product that she saw on TV. Since the patient's symptoms have resolved she  has been encouraged to follow-up with ENT for her vertigo. I do not believe that she needs any endoscopic procedure at this time because of her nausea. The patient has been told to continue her generic omeprazole. The patient has been explained the plan and agrees with it.   Note: This dictation was prepared with Dragon dictation along with smaller phrase technology. Any transcriptional errors that result from this process are unintentional.

## 2015-06-18 NOTE — Telephone Encounter (Signed)
[place] with referral?-aa

## 2015-06-18 NOTE — Telephone Encounter (Signed)
OK to refer. Thanks.

## 2015-06-18 NOTE — Telephone Encounter (Signed)
Pt stated that she has been having trouble with Vertigo, ringing in her ears, and balance. Pt would like to be referred to an ENT. Pt stated that she has discussed the symptoms with Dr. Venia Minks before and that Dr. Venia Minks is aware of the problems with Vertigo. Pt wanted to see if the referral could be done without another OV or if she needs to schedule an OV to discuss the referral. Please advise. Thanks TNP

## 2015-08-30 ENCOUNTER — Other Ambulatory Visit: Payer: Self-pay | Admitting: Family Medicine

## 2015-08-30 DIAGNOSIS — E119 Type 2 diabetes mellitus without complications: Secondary | ICD-10-CM

## 2015-09-02 ENCOUNTER — Ambulatory Visit: Payer: Medicare PPO | Admitting: Family Medicine

## 2015-09-03 ENCOUNTER — Ambulatory Visit (INDEPENDENT_AMBULATORY_CARE_PROVIDER_SITE_OTHER): Payer: Medicare PPO | Admitting: Family Medicine

## 2015-09-03 ENCOUNTER — Encounter: Payer: Self-pay | Admitting: Family Medicine

## 2015-09-03 VITALS — BP 124/64 | HR 76 | Temp 98.2°F | Resp 16 | Ht 67.0 in | Wt 227.0 lb

## 2015-09-03 DIAGNOSIS — E7800 Pure hypercholesterolemia, unspecified: Secondary | ICD-10-CM | POA: Diagnosis not present

## 2015-09-03 DIAGNOSIS — I10 Essential (primary) hypertension: Secondary | ICD-10-CM | POA: Diagnosis not present

## 2015-09-03 DIAGNOSIS — R42 Dizziness and giddiness: Secondary | ICD-10-CM

## 2015-09-03 DIAGNOSIS — E119 Type 2 diabetes mellitus without complications: Secondary | ICD-10-CM

## 2015-09-03 LAB — POCT GLYCOSYLATED HEMOGLOBIN (HGB A1C)
ESTIMATED AVERAGE GLUCOSE: 229
Hemoglobin A1C: 9.6

## 2015-09-03 NOTE — Progress Notes (Signed)
[name] Female    DOB: August 31, 1940   75 y.o.   MRN: [id number] Visit Date: 09/03/2015  Today's Provider: Margarita Rana, MD   Chief Complaint  Patient presents with  . Diabetes   Subjective:    HPI  Diabetes Mellitus Type II, Follow-up:   Lab Results  Component Value Date   HGBA1C 9.6 09/03/2015   HGBA1C 6.7 05/31/2015   HGBA1C 6.0 04/25/2015    Last seen for diabetes 3 months ago.  Management since then includes discontinue Metformin. She reports excellent compliance with treatment. She is not having side effects. Current symptoms include none and have been unchanged. Home blood sugar records: fasting range: 200  Episodes of hypoglycemia? no   Current Insulin Regimen: none Most Recent Eye Exam: utd Weight trend: stable Prior visit with dietician: no Current diet: in general, a "healthy" diet   Current exercise: none  Pertinent Labs:    Component Value Date/Time   CHOL 152 09/19/2014 0943   CHOL 161 09/20/2013   TRIG 101 09/19/2014 0943   HDL 64 09/19/2014 0943   HDL 71* 09/20/2013   LDLCALC 68 09/19/2014 0943   LDLCALC 161 09/20/2013   CREATININE 0.73 04/25/2015 0806   CREATININE 0.96 10/08/2013 1748   CREATININE 0.9 09/20/2013    Wt Readings from Last 3 Encounters:  09/03/15 227 lb (102.967 kg)  06/18/15 221 lb (100.245 kg)  05/31/15 217 lb (98.431 kg)    ------------------------------------------------------------------------   Follow up for dizziness  The patient was last seen for this 3 months ago. Changes made at last visit include referred to ENT and started on Flonase.  She reports excellent compliance with treatment. She feels that condition is Unchanged. She is not having side effects.   ------------------------------------------------------------------------------------      Allergies  Allergen Reactions  . Biaxin [Clarithromycin] Nausea And Vomiting  . Codeine Nausea And Vomiting  . Lisinopril Cough   . Morphine Nausea And Vomiting  . Ciprofloxacin Itching, Nausea Only and Rash   Current Meds  Medication Sig  . aspirin 81 MG tablet Take 81 mg by mouth daily.   . Blood Glucose Monitoring Suppl (TRUE METRIX AIR GLUCOSE METER) DEVI 1 Device by Does not apply route daily. To check blood sugar once a day.  . Blood Glucose Monitoring Suppl (TRUE METRIX AIR GLUCOSE METER) w/Device KIT   . calcium-vitamin D (OSCAL WITH D) 500-200 MG-UNIT per tablet Take 1 tablet by mouth 2 (two) times daily.   . Cholecalciferol 1000 UNITS capsule Take 1,000 Units by mouth daily.   . cyanocobalamin 1000 MCG tablet Take 1,000 mcg by mouth daily.   . fluticasone (FLONASE) 50 MCG/ACT nasal spray Place 2 sprays into both nostrils daily.  Marland Kitchen GLIPIZIDE XL 5 MG 24 hr tablet TAKE 1 TABLET EVERY DAY WITH BREAKFAST  . glucose blood (TRUE METRIX BLOOD GLUCOSE TEST) test strip Use to check blood sugar once a day.  DX: E11.9.  Marland Kitchen hydrochlorothiazide (MICROZIDE) 12.5 MG capsule Take 12.5 mg by mouth daily.  Marland Kitchen omeprazole (PRILOSEC) 20 MG capsule Take 1 capsule (20 mg total) by mouth daily.  . pioglitazone (ACTOS) 30 MG tablet Take 30 mg by mouth daily.  . potassium chloride SA (K-DUR,KLOR-CON) 20 MEQ tablet Take 20 mEq by mouth daily.  . simvastatin (ZOCOR) 20 MG tablet Take 20 mg by mouth at bedtime.  . timolol (TIMOPTIC) 0.5 % ophthalmic solution Place 1 drop into both eyes daily.   . TRUEPLUS LANCETS  28G MISC To check blood sugar once a day.  . valsartan (DIOVAN) 80 MG tablet Take 80 mg by mouth daily.    Review of Systems  Constitutional: Negative.   Cardiovascular: Negative.   Endocrine: Negative.   Neurological: Positive for dizziness.    Social History  Substance Use Topics  . Smoking status: Former Smoker    Quit date: 07/17/1984  . Smokeless tobacco: Never Used  . Alcohol Use: No   Objective:   BP 124/64 mmHg  Pulse 76  Temp(Src) 98.2 F (36.8 C) (Oral)  Resp 16  Ht '5\' 7"'  (1.702 m)  Wt 227 lb  (102.967 kg)  BMI 35.55 kg/m2  Physical Exam  Constitutional: She is oriented to person, place, and time. She appears well-developed and well-nourished.  Cardiovascular: Normal rate and regular rhythm.   Pulmonary/Chest: Effort normal and breath sounds normal.  Neurological: She is alert and oriented to person, place, and time.  Psychiatric: She has a normal mood and affect. Her behavior is normal. Judgment and thought content normal.       Assessment & Plan:     1. Type 2 diabetes mellitus without complication, without long-term current use of insulin ([place]) Not at goal. Patient advised to continue current medication and plan of care. Patient to follow-up with Dr. Caryn Section. Patient referred to endocrinology for evaluation and follow-up. Results for orders placed or performed in visit on 09/03/15  POCT glycosylated hemoglobin (Hb A1C)  Result Value Ref Range   Hemoglobin A1C 9.6    Est. average glucose Bld gHb Est-mCnc 229    - POCT glycosylated hemoglobin (Hb A1C) - Ambulatory referral to Endocrinology  2. Essential hypertension F/U pending lab report. - CBC with Differential/Platelet - Comprehensive metabolic panel  3. Hypercholesteremia F/U pending lab report. - Lipid Panel With LDL/HDL Ratio - TSH  4. Vertigo Recurrent. Patient referred to Dr. Manuella Ghazi for evaluation and follow-up. - Ambulatory referral to Neurology      Patient seen and examined by Dr. Jerrell Belfast, and note scribed by Philbert Riser. Dimas, CMA.  I have reviewed the document for accuracy and completeness and I agree with above. - Jerrell Belfast, MD   Margarita Rana, MD  [facility]

## 2015-09-04 ENCOUNTER — Other Ambulatory Visit: Payer: Self-pay | Admitting: Family Medicine

## 2015-09-05 ENCOUNTER — Telehealth: Payer: Self-pay

## 2015-09-05 LAB — CBC WITH DIFFERENTIAL/PLATELET
BASOS: 1 %
Basophils Absolute: 0 10*3/uL (ref 0.0–0.2)
EOS (ABSOLUTE): 0.1 10*3/uL (ref 0.0–0.4)
EOS: 2 %
HEMATOCRIT: 40.3 % (ref 34.0–46.6)
Hemoglobin: 13.3 g/dL (ref 11.1–15.9)
IMMATURE GRANULOCYTES: 0 %
Immature Grans (Abs): 0 10*3/uL (ref 0.0–0.1)
Lymphocytes Absolute: 1.7 10*3/uL (ref 0.7–3.1)
Lymphs: 36 %
MCH: 29.1 pg (ref 26.6–33.0)
MCHC: 33 g/dL (ref 31.5–35.7)
MCV: 88 fL (ref 79–97)
MONOS ABS: 0.6 10*3/uL (ref 0.1–0.9)
Monocytes: 13 %
NEUTROS PCT: 48 %
Neutrophils Absolute: 2.3 10*3/uL (ref 1.4–7.0)
Platelets: 180 10*3/uL (ref 150–379)
RBC: 4.57 x10E6/uL (ref 3.77–5.28)
RDW: 13.4 % (ref 12.3–15.4)
WBC: 4.8 10*3/uL (ref 3.4–10.8)

## 2015-09-05 LAB — COMPREHENSIVE METABOLIC PANEL
A/G RATIO: 1.4 (ref 1.2–2.2)
ALBUMIN: 4.2 g/dL (ref 3.5–4.8)
ALT: 25 IU/L (ref 0–32)
AST: 28 IU/L (ref 0–40)
Alkaline Phosphatase: 53 IU/L (ref 39–117)
BUN/Creatinine Ratio: 23 (ref 12–28)
BUN: 16 mg/dL (ref 8–27)
Bilirubin Total: 0.3 mg/dL (ref 0.0–1.2)
CALCIUM: 9.4 mg/dL (ref 8.7–10.3)
CO2: 25 mmol/L (ref 18–29)
CREATININE: 0.71 mg/dL (ref 0.57–1.00)
Chloride: 99 mmol/L (ref 96–106)
GFR, EST AFRICAN AMERICAN: 98 mL/min/{1.73_m2} (ref >59)
GFR, EST NON AFRICAN AMERICAN: 85 mL/min/{1.73_m2} (ref >59)
GLOBULIN, TOTAL: 3 g/dL (ref 1.5–4.5)
Glucose: 210 mg/dL — ABNORMAL HIGH (ref 65–99)
POTASSIUM: 4.4 mmol/L (ref 3.5–5.2)
SODIUM: 139 mmol/L (ref 134–144)
TOTAL PROTEIN: 7.2 g/dL (ref 6.0–8.5)

## 2015-09-05 LAB — LIPID PANEL WITH LDL/HDL RATIO
Cholesterol, Total: 183 mg/dL (ref 100–199)
HDL: 55 mg/dL (ref >39)
LDL Calculated: 99 mg/dL (ref 0–99)
LDl/HDL Ratio: 1.8 ratio units (ref 0.0–3.2)
Triglycerides: 146 mg/dL (ref 0–149)
VLDL Cholesterol Cal: 29 mg/dL (ref 5–40)

## 2015-09-05 LAB — TSH: TSH: 4.17 u[IU]/mL (ref 0.450–4.500)

## 2015-09-05 NOTE — Telephone Encounter (Signed)
Pt advised.   Thanks,   -Katya Rolston  

## 2015-09-05 NOTE — Telephone Encounter (Signed)
LMTCB 09/05/2015  Thanks,   -Mickel Baas

## 2015-09-05 NOTE — Telephone Encounter (Signed)
-----   Message from Margarita Rana, MD sent at 09/05/2015 10:52 AM EDT ----- Labs stable except for elevated blood sugar which we are already addressing. Thanks.

## 2015-09-28 ENCOUNTER — Other Ambulatory Visit: Payer: Self-pay | Admitting: Family Medicine

## 2015-09-28 DIAGNOSIS — E119 Type 2 diabetes mellitus without complications: Secondary | ICD-10-CM

## 2015-11-14 ENCOUNTER — Other Ambulatory Visit: Payer: Self-pay | Admitting: Family Medicine

## 2015-11-14 DIAGNOSIS — E7800 Pure hypercholesterolemia, unspecified: Secondary | ICD-10-CM

## 2015-12-06 ENCOUNTER — Other Ambulatory Visit: Payer: Self-pay | Admitting: Family Medicine

## 2015-12-06 ENCOUNTER — Other Ambulatory Visit: Payer: Self-pay | Admitting: Physician Assistant

## 2015-12-06 DIAGNOSIS — I10 Essential (primary) hypertension: Secondary | ICD-10-CM

## 2015-12-09 ENCOUNTER — Other Ambulatory Visit: Payer: Self-pay | Admitting: Neurology

## 2015-12-09 DIAGNOSIS — R42 Dizziness and giddiness: Secondary | ICD-10-CM

## 2015-12-09 DIAGNOSIS — R531 Weakness: Secondary | ICD-10-CM

## 2015-12-20 ENCOUNTER — Encounter: Payer: Self-pay | Admitting: Radiology

## 2015-12-20 ENCOUNTER — Ambulatory Visit
Admission: RE | Admit: 2015-12-20 | Discharge: 2015-12-20 | Disposition: A | Discharge status: Home / Self Care | Payer: Medicare PPO | Source: Ambulatory Visit | Attending: Neurology | Admitting: Neurology

## 2015-12-20 ENCOUNTER — Other Ambulatory Visit: Payer: Self-pay | Admitting: Neurology

## 2015-12-20 DIAGNOSIS — R531 Weakness: Secondary | ICD-10-CM | POA: Diagnosis not present

## 2015-12-20 DIAGNOSIS — R42 Dizziness and giddiness: Secondary | ICD-10-CM | POA: Diagnosis present

## 2015-12-20 DIAGNOSIS — R9089 Other abnormal findings on diagnostic imaging of central nervous system: Secondary | ICD-10-CM | POA: Insufficient documentation

## 2015-12-23 ENCOUNTER — Other Ambulatory Visit: Payer: Self-pay | Admitting: Neurology

## 2015-12-23 DIAGNOSIS — R2689 Other abnormalities of gait and mobility: Secondary | ICD-10-CM

## 2015-12-24 ENCOUNTER — Ambulatory Visit: Payer: Medicare PPO

## 2015-12-25 ENCOUNTER — Encounter: Payer: Self-pay | Admitting: Physical Therapy

## 2015-12-25 ENCOUNTER — Ambulatory Visit: Payer: Medicare PPO | Attending: Neurology | Admitting: Physical Therapy

## 2015-12-25 DIAGNOSIS — R262 Difficulty in walking, not elsewhere classified: Secondary | ICD-10-CM

## 2015-12-25 DIAGNOSIS — M6281 Muscle weakness (generalized): Secondary | ICD-10-CM | POA: Diagnosis present

## 2015-12-25 NOTE — Therapy (Signed)
[place] MAIN [facility] SERVICES [address] [place], [place], [address] Phone: [phone]   Fax:  [phone]  Physical Therapy Evaluation  Patient Details  Name: Kathleen Campos MRN: [id number] Date of Birth: May 27, 1940 Referring Provider: Vladimir Crofts   Encounter Date: 12/25/2015      PT End of Session - 12/25/15 1017    Visit Number 1   Number of Visits 17   Date for PT Re-Evaluation 03/23/2016   Authorization Type g codes   PT Start Time 1000   PT Stop Time 1100   PT Time Calculation (min) 60 min   Activity Tolerance Patient tolerated treatment well;Patient limited by fatigue   Behavior During Therapy [facility] for tasks assessed/performed      Past Medical History:  Diagnosis Date  . Diabetes mellitus without complication ([place])   . GERD (gastroesophageal reflux disease)   . Hyperlipidemia   . Hypertension     Past Surgical History:  Procedure Laterality Date  . BACK SURGERY  2003  . CERVICAL BIOPSY  W/ LOOP ELECTRODE EXCISION  2010  . CHOLECYSTECTOMY  1986    There were no vitals filed for this visit.       Subjective Assessment - 12/25/15 1013    Subjective Patient feels unsteady when she walks.    Pertinent History back surgery, CVA, diabetes. Patient was hospitilized for vertigo in March and she had a CAT scan and found out she had a CVA.    Diagnostic tests MRI   Patient Stated Goals to walk better   Currently in Pain? 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 and she has pain intermittently in BLE       PAIN: 4/10 left shoulder POSTURE: WFL   PROM/AROM: WFL   STRENGTH:  Graded on a 0-5 scale Muscle Group Left Right  Shoulder flex    Shoulder Abd    Shoulder Ext    Shoulder IR/ER    Elbow     Wrist/hand    Hip Flex 4/5 4/5  Hip Abd -3/5 -3/5  Hip Add 2/5 2/5  Hip Ext 2/5 2/5  Hip IR/ER 4/5 4/5  Knee Flex 5/5 5/5  Knee Ext 5/5 5/5  Ankle DF 5/5 5/5  Ankle PF 5/5 5/5   SENSATION: numbness in BLE toes   FUNCTIONAL MOBILITY: independent but slow   BALANCE: unable to tandem stand or single leg stand   GAIT: ambulates household and intermediate distances without AD.   OUTCOME MEASURES: TEST Outcome Interpretation  5 times sit<>stand 9.13sec >60 yo, >15 sec indicates increased risk for falls  10 meter walk test 1.15                m/s <1.0 m/s indicates increased risk for falls; limited community ambulator  Timed up and Go  11.96             sec <14 sec indicates increased risk for falls  6 minute walk test    1100            Feet 1000 feet is community ambulator                  [facility] PT Assessment - 12/25/15 0001  Assessment   Medical Diagnosis balance dysfunction   Referring Provider [facility], [facility] K    Onset Date/Surgical Date 12/23/15   Hand Dominance Right   Next MD Visit 02/17/16   Prior Therapy --  no     Precautions   Precautions Fall     Restrictions   Weight Bearing Restrictions No     Balance Screen   Has the patient fallen in the past 6 months No   Has the patient had a decrease in activity level because of a fear of falling?  No   Is the patient reluctant to leave their home because of a fear of falling?  No     Home Ecologist residence   Living Arrangements Spouse/significant other   Available Help at Discharge Family   Type of [place] to enter   Entrance Stairs-Number of Steps 3   Entrance Stairs-Rails Right   [place] One level   [place] None     Prior Function   Level of Independence Independent   Vocation Part time employment   Leisure go out to eat, shop, [employer]   Overall Cognitive Status Within Functional Limits for tasks assessed                            PT Education - January 08, 2016 1016    Education provided Yes   Education Details plan of care   Person(s) Educated Patient   Methods Explanation   Comprehension Verbalized understanding             PT Long Term Goals - 01-08-16 1054      PT LONG TERM GOAL #1   Title Patient will be independent in home exercise program to improve strength/mobility for better functional independence with ADLs.   Time 8   Period Weeks   Status New     PT LONG TERM GOAL #2   Title Patient will be independent with ascend/descend 12 steps using single UE in step over step pattern without LOB.   Time 8   Period Weeks   Status New     PT LONG TERM GOAL #3   Title Patient will tolerate 5 seconds of single leg stance without loss of balance to improve ability to get in and out of shower safely   Time [place] - 01/08/2016 1018    Clinical Impression Statement Patient has difficutly walking with unsteady gait. She has weakness in BLE and decreased dynamic standing balance and will benefit from skilled PT to improve sterngth and balane and decrease her falls risk.    Rehab Potential Good   Clinical Impairments Affecting Rehab Potential DM, CVA, multiple pain sites including left shoulder, back and LE's   PT Frequency 1x / week   PT Duration 8 weeks   PT Treatment/Interventions Cryotherapy;Moist Heat;Therapeutic activities;Gait training;Therapeutic exercise;Balance training;Neuromuscular re-education   Consulted and Agree with Plan of Care Patient      Patient will benefit from skilled therapeutic intervention in order to improve the following deficits and impairments:  Abnormal gait, Decreased balance, Decreased mobility, Decreased range of motion, Pain  Visit Diagnosis: Difficulty in walking, not elsewhere classified  Muscle weakness (generalized)      G-Codes - 01-08-16 1056  Functional Assessment  Tool Used 6 MW, clinical judgement, TUG, 10 MW, TUG   Functional Limitation Mobility: Walking and moving around   Mobility: Walking and Moving Around Current Status [phone]) At least 20 percent but less than 40 percent impaired, limited or restricted   Mobility: Walking and Moving Around Goal Status [phone]) At least 1 percent but less than 20 percent impaired, limited or restricted       Problem List Patient Active Problem List   Diagnosis Date Noted  . Nausea 05/02/2015  . GERD (gastroesophageal reflux disease) 04/24/2015  . HLD (hyperlipidemia) 04/24/2015  . Dizziness 04/24/2015  . Elevated troponin 04/24/2015  . Otitis externa 12/21/2014  . Allergic rhinitis 07/18/2014  . Colon polyp 07/18/2014  . Acid reflux 07/18/2014  . Hypercholesteremia 07/18/2014  . Low serum cobalamin 07/18/2014  . Adult BMI 30+ 07/18/2014  . Avitaminosis D 07/18/2014  . Diabetes mellitus, type 2 ([place]) 09/09/1998  . AG (atrophic gastritis) 03/06/1998  . HTN (hypertension) 06/04/1994    Alanson Puls 12/25/2015, 12:21 PM  [place] MAIN [facility] SERVICES [address] [place], [place], [address] Phone: [phone]   Fax:  [phone]  Name: Kathleen Campos MRN: [id number] Date of Birth: [birth year]

## 2016-01-03 ENCOUNTER — Ambulatory Visit: Payer: Medicare PPO

## 2016-01-03 ENCOUNTER — Ambulatory Visit
Admission: RE | Admit: 2016-01-03 | Discharge: 2016-01-03 | Disposition: A | Discharge status: Home / Self Care | Payer: Medicare PPO | Source: Ambulatory Visit | Attending: Neurology | Admitting: Neurology

## 2016-01-03 DIAGNOSIS — R2689 Other abnormalities of gait and mobility: Secondary | ICD-10-CM | POA: Diagnosis present

## 2016-01-03 LAB — POCT I-STAT CREATININE: Creatinine, Ser: 0.7 mg/dL (ref 0.44–1.00)

## 2016-01-03 MED ORDER — GADOBENATE DIMEGLUMINE 529 MG/ML IV SOLN
20.0000 mL | Freq: Once | INTRAVENOUS | Status: AC | PRN
  Administered 2016-01-03: 20 mL via INTRAVENOUS

## 2016-01-08 ENCOUNTER — Other Ambulatory Visit
Admission: RE | Admit: 2016-01-08 | Discharge: 2016-01-08 | Disposition: A | Discharge status: Home / Self Care | Payer: Medicare PPO | Source: Ambulatory Visit | Attending: Neurology | Admitting: Neurology

## 2016-01-08 ENCOUNTER — Ambulatory Visit: Payer: Medicare PPO | Attending: Neurology

## 2016-01-08 DIAGNOSIS — R262 Difficulty in walking, not elsewhere classified: Secondary | ICD-10-CM | POA: Diagnosis not present

## 2016-01-08 DIAGNOSIS — M6281 Muscle weakness (generalized): Secondary | ICD-10-CM | POA: Insufficient documentation

## 2016-01-08 DIAGNOSIS — Z01812 Encounter for preprocedural laboratory examination: Secondary | ICD-10-CM | POA: Insufficient documentation

## 2016-01-08 LAB — APTT: APTT: 25 s (ref 24–36)

## 2016-01-08 NOTE — Therapy (Signed)
[place] MAIN [facility] SERVICES [address] [place], [place], [address] Phone: [phone]   Fax:  [phone]  Physical Therapy Treatment  Patient Details  Name: Kathleen Campos MRN: [id number] Date of Birth: [birth year] Referring Provider: Vladimir Crofts   Encounter Date: 01/08/2016      PT End of Session - 01/08/16 1208    Visit Number 2   Number of Visits 17   Date for PT Re-Evaluation 03-11-2016   Authorization Type g codes 05-03-22)   PT Start Time 1115   PT Stop Time 1200   PT Time Calculation (min) 45 min   Activity Tolerance Patient tolerated treatment well;Patient limited by fatigue   Behavior During Therapy [facility] for tasks assessed/performed      Past Medical History:  Diagnosis Date  . Diabetes mellitus without complication (Post)   . GERD (gastroesophageal reflux disease)   . Hyperlipidemia   . Hypertension     Past Surgical History:  Procedure Laterality Date  . BACK SURGERY  2003  . CERVICAL BIOPSY  W/ LOOP ELECTRODE EXCISION  2010  . CHOLECYSTECTOMY  1986    There were no vitals filed for this visit.      Subjective Assessment - 01/08/16 1121    Subjective Patient states increased difficulty with walking and standing reporting she does not feel steady on her feet   Pertinent History back surgery, CVA, diabetes. Patient was hospitilized for vertigo in March and she had a CAT scan and found out she had a CVA.    Diagnostic tests MRI   Patient Stated Goals to walk better   Currently in Pain? Yes   Pain Score 1    Pain Location Shoulder   Pain Orientation Left   Pain Onset More than a month ago   Pain Frequency Intermittent      TREATMENT Therapeutic Exercise Nustep level 3 (seat position 8) - x77min for warm up Seated clamshells with GTB for glute med activation-2 x 20  Sit to stands - 2 x 10 with cueing on foot positioning Straight leg hip extension in standing - 2 x 15 with cueing on glute  activation Straight leg hip abduction in standing - 2 x 15 with cueing on glute activation Step ups on 8" step - x 10 bilaterally  Neuromusc rehab Feet together on airex - 30 sec EO, head turns Up/down - 2x15, left/right - 2 x 15 ( one set with 1kg ball), EC 30 sec x 2  Tandem ambulation down airex beam - forward x5 down and back, backwards x2  down and back Side stepping on airex beam - x6 down and back with cueing on hip activation  Heel taps from airex pad onto 8" step - x15   EDUCATION:   Provided education on form and technique throughout treatment session through explanation and pt verbalized understanding on education material.         PT Long Term Goals - 12/25/15 1054      PT LONG TERM GOAL #1   Title Patient will be independent in home exercise program to improve strength/mobility for better functional independence with ADLs.   Time 8   Period Weeks   Status New     PT LONG TERM GOAL #2   Title Patient will be independent with ascend/descend 12 steps using single UE in step over step pattern without LOB.   Time 8   Period Weeks   Status New     PT  LONG TERM GOAL #3   Title Patient will tolerate 5 seconds of single leg stance without loss of balance to improve ability to get in and out of shower safely   Time 8   Period Weeks   Status New               Plan - 01/08/16 1152    Clinical Impression Statement Patient demonstrated decreased muscular endurance with exercise requiring increased breaks between sets. Patient also demonstrates increased postural sway with exercise indicating decreased static/dynamic balance and will benefit from further skilled therapy to decrease fall risk    Rehab Potential Good   Clinical Impairments Affecting Rehab Potential DM, CVA, multiple pain sites including left shoulder, back and LE's   PT Frequency 1x / week   PT Duration 8 weeks      Patient will benefit from skilled therapeutic intervention in order to improve the  following deficits and impairments:  Abnormal gait, Decreased balance, Decreased mobility, Decreased range of motion, Pain  Visit Diagnosis: Difficulty in walking, not elsewhere classified  Muscle weakness (generalized)     Problem List Patient Active Problem List   Diagnosis Date Noted  . Nausea 05/02/2015  . GERD (gastroesophageal reflux disease) 04/24/2015  . HLD (hyperlipidemia) 04/24/2015  . Dizziness 04/24/2015  . Elevated troponin 04/24/2015  . Otitis externa 12/21/2014  . Allergic rhinitis 07/18/2014  . Colon polyp 07/18/2014  . Acid reflux 07/18/2014  . Hypercholesteremia 07/18/2014  . Low serum cobalamin 07/18/2014  . Adult BMI 30+ 07/18/2014  . Avitaminosis D 07/18/2014  . Diabetes mellitus, type 2 ([place]) 09/09/1998  . AG (atrophic gastritis) 03/06/1998  . HTN (hypertension) 06/04/1994    Blythe Stanford 01/08/2016, 12:09 PM  [place] MAIN [facility] SERVICES [address] [place], [place], [address] Phone: [phone]   Fax:  [phone]  Name: ALISSIA LAFAZIA MRN: [id number] Date of Birth: [birth year]

## 2016-01-09 ENCOUNTER — Other Ambulatory Visit: Payer: Self-pay | Admitting: Neurology

## 2016-01-09 DIAGNOSIS — G039 Meningitis, unspecified: Secondary | ICD-10-CM

## 2016-01-10 ENCOUNTER — Ambulatory Visit: Payer: Medicare PPO

## 2016-01-14 ENCOUNTER — Encounter: Payer: Self-pay | Admitting: Physician Assistant

## 2016-01-14 ENCOUNTER — Ambulatory Visit (INDEPENDENT_AMBULATORY_CARE_PROVIDER_SITE_OTHER): Payer: Medicare PPO | Admitting: Physician Assistant

## 2016-01-14 VITALS — BP 112/60 | HR 84 | Temp 98.1°F | Resp 16 | Wt 218.0 lb

## 2016-01-14 DIAGNOSIS — T148XXA Other injury of unspecified body region, initial encounter: Secondary | ICD-10-CM

## 2016-01-14 MED ORDER — CYCLOBENZAPRINE HCL 10 MG PO TABS: 10.0000 mg | ORAL_TABLET | Freq: Every day | ORAL | 0 refills | Status: AC

## 2016-01-14 NOTE — Progress Notes (Signed)
[name] Female    DOB: 02-24-1941   75 y.o.   MRN: [id number] Visit Date: 01/14/2016  Today's Provider: Trinna Post, PA-C   Chief Complaint  Patient presents with  . Arm Pain    Bilateral, but left worse the right.  Started five days ago.   Subjective:    Arm Pain   The incident occurred 5 to 7 days ago. There was no injury mechanism (Pt reports doing physical therapy.). Pain location: Bilateral arm pain starts from the wrist to shoulder.  Worse in the shoulder area.   The quality of the pain is described as aching and cramping. Radiates to: Left side radiates to under her arm. The pain is at a severity of 7/10. The pain has been constant since the incident. Associated symptoms include numbness. She has tried acetaminophen and NSAIDs for the symptoms.   Patient is  75 y/o ffemale with history of DM Type II and possible meninginitis describing bilateral  arm pain ongoing 5 days since starting physical therapy. She says the pain is worse in her left shoulder and forearm, and is also present in her right forearm. It is a 7/10 at its worst. Worsens with movement, especially supination. Also endorses some left sided neck pain. Rubbed some aspercream three times a day which helped a little yesterday.  Took some tylenol and some aleve, which did not help. No sensation loss. No injury. Patient has also noted some mild headaches.  Patient also asks about her right big toe, which she dropped a card table on two months ago, resulting in avulsion fracture. Patient reports there is some residual pain. Patient reports numbness and tingling in feet that is not new, secondary to her DM Ii.     Allergies  Allergen Reactions  . Biaxin [Clarithromycin] Nausea And Vomiting  . Codeine Nausea And Vomiting  . Lisinopril Cough  . Metformin Nausea Only  . Morphine Nausea And Vomiting  . Ciprofloxacin Itching, Nausea Only and Rash     Current Outpatient Prescriptions:  .   aspirin 81 MG tablet, Take 81 mg by mouth daily. , Disp: , Rfl:  .  Blood Glucose Monitoring Suppl (TRUE METRIX AIR GLUCOSE METER) DEVI, 1 Device by Does not apply route daily. To check blood sugar once a day., Disp: 1 Device, Rfl: 0 .  Blood Glucose Monitoring Suppl (TRUE METRIX AIR GLUCOSE METER) w/Device KIT, , Disp: , Rfl:  .  calcium-vitamin D (OSCAL WITH D) 500-200 MG-UNIT per tablet, Take 1 tablet by mouth 2 (two) times daily. , Disp: , Rfl:  .  canagliflozin (INVOKANA) 100 MG TABS tablet, Take 100 mg by mouth daily before breakfast., Disp: , Rfl:  .  Cholecalciferol 1000 UNITS capsule, Take 1,000 Units by mouth daily. , Disp: , Rfl:  .  cyanocobalamin 1000 MCG tablet, Take 1,000 mcg by mouth daily. , Disp: , Rfl:  .  fluticasone (FLONASE) 50 MCG/ACT nasal spray, Place 2 sprays into both nostrils daily., Disp: 48 g, Rfl: 3 .  GLIPIZIDE XL 5 MG 24 hr tablet, TAKE 1 TABLET EVERY DAY WITH BREAKFAST, Disp: 90 tablet, Rfl: 1 .  glucose blood (TRUE METRIX BLOOD GLUCOSE TEST) test strip, Use to check blood sugar once a day.  DX: E11.9., Disp: 100 each, Rfl: 1 .  hydrochlorothiazide (MICROZIDE) 12.5 MG capsule, Take 12.5 mg by mouth daily., Disp: , Rfl:  .  omeprazole (PRILOSEC) 20 MG capsule, Take 1 capsule (20 mg  total) by mouth daily., Disp: 30 capsule, Rfl: 0 .  pioglitazone (ACTOS) 30 MG tablet, TAKE 1 TABLET EVERY DAY, Disp: 90 tablet, Rfl: 1 .  potassium chloride SA (K-DUR,KLOR-CON) 20 MEQ tablet, Take 20 mEq by mouth daily., Disp: , Rfl:  .  simvastatin (ZOCOR) 20 MG tablet, TAKE 1 TABLET AT BEDTIME, Disp: 90 tablet, Rfl: 1 .  timolol (TIMOPTIC) 0.5 % ophthalmic solution, Place 1 drop into both eyes daily. , Disp: , Rfl:  .  TRUEPLUS LANCETS 28G MISC, To check blood sugar once a day., Disp: 100 each, Rfl: 3 .  valsartan (DIOVAN) 80 MG tablet, TAKE 1 TABLET EVERY DAY, Disp: 90 tablet, Rfl: 1 .  cyclobenzaprine (FLEXERIL) 10 MG tablet, Take 1 tablet (10 mg total) by mouth at bedtime., Disp: 14  tablet, Rfl: 0  Review of Systems  Constitutional: Positive for appetite change and fatigue. Negative for activity change, chills, diaphoresis, fever and unexpected weight change.  Respiratory: Positive for cough (Occasionally). Negative for apnea, choking, chest tightness, shortness of breath, wheezing and stridor.   Cardiovascular: Negative.   Gastrointestinal: Positive for nausea. Negative for abdominal distention, abdominal pain, anal bleeding, blood in stool, constipation, diarrhea, rectal pain and vomiting.  Musculoskeletal: Positive for arthralgias, gait problem, myalgias and neck pain. Negative for back pain, joint swelling and neck stiffness.  Neurological: Positive for numbness and headaches (Had a headache all day Sunday, but it improved the next day. ). Negative for dizziness, tremors, seizures, syncope, facial asymmetry, speech difficulty, weakness and light-headedness.    Social History  Substance Use Topics  . Smoking status: Former Smoker    Quit date: 07/17/1984  . Smokeless tobacco: Never Used  . Alcohol use No   Objective:   BP 112/60 (BP Location: Left Arm, Patient Position: Sitting, Cuff Size: Large)   Pulse 84   Temp 98.1 F (36.7 C) (Oral)   Resp 16   Wt 218 lb (98.9 kg)   BMI 34.14 kg/m   Physical Exam  Constitutional: She is oriented to person, place, and time. She appears well-developed and well-nourished.  Musculoskeletal: Normal range of motion. She exhibits tenderness. She exhibits no edema or deformity.  There is normal range of motion in bilateral upper extremities, though it is accompanied by pain. Patient exhibits particular pain with supination and pronation of left arm. Normal strength in bilateral upper extremities. Sensation grossly intact. Good cap refill. Warm and well perfused. Some tenderness to palpation on dorsal left forearm. Marked tightness in patient's left trapezius and SCM.  Big toe warm and well perfused. Sensation grossly intact.     Neurological: She is alert and oriented to person, place, and time. She has normal strength. No sensory deficit.        Assessment & Plan:      Problem List Items Addressed This Visit    None    Visit Diagnoses    Muscle strain    -  Primary   Relevant Medications   cyclobenzaprine (FLEXERIL) 10 MG tablet     Patient is 75 y/o woman presenting with bilateral arm pain which in context of onset with recent physical therapy likely represents muscle strain. Patient has taken flexeril before with no issues, I have given her this today. Instructed to take before bed and no driving. Counseled on possibility of increased falls and necessity to take immediately before bed. Patient agrees to this. Will follow up as needed.  Return if symptoms worsen or fail to improve.  The entirety of  the information documented in the History of Present Illness, Review of Systems and Physical Exam were personally obtained by me. Portions of this information were initially documented by Ashley Royalty, CMA and reviewed by me for thoroughness and accuracy.    Patient Instructions  Muscle Strain A muscle strain is an injury that occurs when a muscle is stretched beyond its normal length. Usually a small number of muscle fibers are torn when this happens. Muscle strain is rated in degrees. First-degree strains have the least amount of muscle fiber tearing and pain. Second-degree and third-degree strains have increasingly more tearing and pain.  Usually, recovery from muscle strain takes 1-2 weeks. Complete healing takes 5-6 weeks.  CAUSES  Muscle strain happens when a sudden, violent force placed on a muscle stretches it too far. This may occur with lifting, sports, or a fall.  RISK FACTORS Muscle strain is especially common in athletes.  SIGNS AND SYMPTOMS At the site of the muscle strain, there may be:  Pain.  Bruising.  Swelling.  Difficulty using the muscle due to pain or lack of normal  function. DIAGNOSIS  Your health care provider will perform a physical exam and ask about your medical history. TREATMENT  Often, the best treatment for a muscle strain is resting, icing, and applying cold compresses to the injured area.  HOME CARE INSTRUCTIONS   Use the PRICE method of treatment to promote muscle healing during the first 2-3 days after your injury. The PRICE method involves:  Protecting the muscle from being injured again.  Restricting your activity and resting the injured body part.  Icing your injury. To do this, put ice in a plastic bag. Place a towel between your skin and the bag. Then, apply the ice and leave it on from 15-20 minutes each hour. After the third day, switch to moist heat packs.  Apply compression to the injured area with a splint or elastic bandage. Be careful not to wrap it too tightly. This may interfere with blood circulation or increase swelling.  Elevate the injured body part above the level of your heart as often as you can.  Only take over-the-counter or prescription medicines for pain, discomfort, or fever as directed by your health care provider.  Warming up prior to exercise helps to prevent future muscle strains. SEEK MEDICAL CARE IF:   You have increasing pain or swelling in the injured area.  You have numbness, tingling, or a significant loss of strength in the injured area. MAKE SURE YOU:   Understand these instructions.  Will watch your condition.  Will get help right away if you are not doing well or get worse.   This information is not intended to replace advice given to you by your health care provider. Make sure you discuss any questions you have with your health care provider.   Document Released: 02/23/2005 Document Revised: 12/14/2012 Document Reviewed: 09/22/2012 Elsevier Interactive Patient Education 2016 [place], PA-C  [facility]

## 2016-01-14 NOTE — Patient Instructions (Signed)

## 2016-01-15 ENCOUNTER — Ambulatory Visit: Payer: Medicare PPO

## 2016-01-16 ENCOUNTER — Ambulatory Visit
Admission: RE | Admit: 2016-01-16 | Discharge: 2016-01-16 | Disposition: A | Discharge status: Home / Self Care | Payer: Medicare PPO | Source: Ambulatory Visit | Attending: Physician Assistant | Admitting: Physician Assistant

## 2016-01-16 ENCOUNTER — Telehealth: Payer: Self-pay

## 2016-01-16 ENCOUNTER — Telehealth: Payer: Self-pay | Admitting: Physician Assistant

## 2016-01-16 DIAGNOSIS — M79602 Pain in left arm: Secondary | ICD-10-CM

## 2016-01-16 DIAGNOSIS — M79601 Pain in right arm: Secondary | ICD-10-CM

## 2016-01-16 NOTE — Telephone Encounter (Signed)
Pt states she was seen Tuesday for arm and shoulder pain.  Pt states she is not any better and now she is hurting all the time.  Pt is asking if she can have an x-ray and a different Rx to help with this.  [facility] [place].  [phone]

## 2016-01-16 NOTE — Telephone Encounter (Signed)
Patient called back with continued arm pain. Have ordered bilateral shoulder xrays which she may get at [facility] on a walk in basis. Will see results before prescribing anything else.

## 2016-01-16 NOTE — Telephone Encounter (Signed)
Pt advised.   Thanks,   -Laura  

## 2016-01-16 NOTE — Telephone Encounter (Signed)
Pt advised.  She states she already has a follow up scheduled with Tawanna Sat in December.    Thanks,   -Mickel Baas

## 2016-01-16 NOTE — Telephone Encounter (Signed)
-----   Message from Trinna Post, [place] sent at 01/16/2016  4:38 PM EST ----- Shoulder xrays unremarkable. No obvious MSK cause for pain outside of recent physical therapy. Patient is on simvastatin, which may have side effects of muscle aches. Can we please have patient stop simvastatin and see if this helps? Have her schedule a follow up visit in one month to assess symptoms. Sooner if worsening.

## 2016-01-17 ENCOUNTER — Ambulatory Visit: Payer: Medicare PPO

## 2016-01-20 ENCOUNTER — Ambulatory Visit
Admission: RE | Admit: 2016-01-20 | Discharge: 2016-01-20 | Disposition: A | Discharge status: Home / Self Care | Payer: Medicare PPO | Source: Ambulatory Visit | Attending: Neurology | Admitting: Neurology

## 2016-01-20 DIAGNOSIS — G039 Meningitis, unspecified: Secondary | ICD-10-CM | POA: Diagnosis not present

## 2016-01-20 LAB — CBC
HCT: 42.7 % (ref 35.0–47.0)
Hemoglobin: 14.3 g/dL (ref 12.0–16.0)
MCH: 28.7 pg (ref 26.0–34.0)
MCHC: 33.6 g/dL (ref 32.0–36.0)
MCV: 85.5 fL (ref 80.0–100.0)
Platelets: 146 10*3/uL — ABNORMAL LOW (ref 150–440)
RBC: 4.99 MIL/uL (ref 3.80–5.20)
RDW: 14.1 % (ref 11.5–14.5)
WBC: 4.5 10*3/uL (ref 3.6–11.0)

## 2016-01-20 LAB — PROTEIN, CSF: Total  Protein, CSF: 144 mg/dL — ABNORMAL HIGH (ref 15–45)

## 2016-01-20 LAB — GLUCOSE, CSF: Glucose, CSF: 97 mg/dL — ABNORMAL HIGH (ref 40–70)

## 2016-01-20 MED ORDER — ACETAMINOPHEN 500 MG PO TABS
1000.0000 mg | ORAL_TABLET | Freq: Four times a day (QID) | ORAL | Status: DC | PRN
  Filled 2016-01-20: qty 2

## 2016-01-21 ENCOUNTER — Ambulatory Visit: Payer: Medicare PPO

## 2016-01-21 ENCOUNTER — Encounter: Payer: Self-pay | Admitting: Family Medicine

## 2016-01-21 ENCOUNTER — Ambulatory Visit (INDEPENDENT_AMBULATORY_CARE_PROVIDER_SITE_OTHER): Payer: Medicare PPO | Admitting: Family Medicine

## 2016-01-21 VITALS — BP 120/64 | HR 100 | Temp 98.0°F | Resp 16 | Wt 221.0 lb

## 2016-01-21 DIAGNOSIS — R202 Paresthesia of skin: Secondary | ICD-10-CM

## 2016-01-21 DIAGNOSIS — M79602 Pain in left arm: Secondary | ICD-10-CM

## 2016-01-21 LAB — CSF CELL COUNT WITH DIFFERENTIAL
Eosinophils, CSF: 0 %
LYMPHS CSF: 92 %
MONOCYTE-MACROPHAGE-SPINAL FLUID: 0 %
Other Cells, CSF: 0
RBC COUNT CSF: 124 /mm3 — AB (ref 0–3)
SEGMENTED NEUTROPHILS-CSF: 8 %
Tube #: 3
WBC, CSF: 187 /mm3 (ref 0–5)

## 2016-01-21 MED ORDER — HYDROCODONE-ACETAMINOPHEN 5-325 MG PO TABS: ORAL_TABLET | ORAL | 0 refills | Status: DC

## 2016-01-21 MED ORDER — GABAPENTIN 300 MG PO CAPS: 300.0000 mg | ORAL_CAPSULE | Freq: Three times a day (TID) | ORAL | 0 refills | Status: DC

## 2016-01-21 NOTE — Patient Instructions (Signed)
Try the gabapentin tonight, if you tolerate it start it 3 x day. May use the hydrocodone as needed for pain.

## 2016-01-21 NOTE — Progress Notes (Signed)
Subjective:     Patient ID: Kathleen Campos, female   DOB: 12/08/1940, 75 y.o.   MRN: [id number]  HPI  Chief Complaint  Patient presents with  . Shoulder Pain    Left shoulder radiating down left arm x 2 weeks. Fingers are numb on left hand. Is a constant ache that started with muscle spasms. Is a 7/10 on a pain scale. Decreased ROM; having difficulty lifting arm. Tender with palpation. Pt saw Adriana on 01/14/2016 for muscle spasm, and was prescribed Flexeril 10 mg, without relief. Pt was also advised to D/C her statin, also without relief.   Localizes numbness to left first two fingers and thenar area. Has been taking ibuprofen with little improvement. 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 She will seeing I.D. M.D.ad well next week. Will see her endocrine doctor 11/29.   Review of Systems  Constitutional:       Had the shingles vaccine in 2009.       Objective:   Physical Exam  Constitutional: She appears well-developed and well-nourished. No distress.  Musculoskeletal:  Cervical FROM.  Left shoulder strength/grip 5/5. Tender over the left upper trapezius, upper arm and thenar area. Non-vesicular rash noted in thenar area (patient states it has been there a week). Mild increased pain with shoulder flexion.       Assessment:    1. Diffuse pain in left upper extremity - Ambulatory referral to Orthopedic Surgery - HYDROcodone-acetaminophen (NORCO/VICODIN) 5-325 MG tablet; 1/2 to one every 6 hours as needed for arm pain  Dispense: 28 tablet; Refill: 0  2. Paresthesias in left hand - gabapentin (NEURONTIN) 300 MG capsule; Take 1 capsule (300 mg total) by mouth 3 (three) times daily.  Dispense: 90 capsule; Refill: 0 - Ambulatory referral to Orthopedic Surgery    Plan:    Discussed trying gabapentin first at bedtime and monitor  for tolerance.

## 2016-01-22 LAB — IGG CSF INDEX: IGG CSF: UNDETERMINED mg/dL

## 2016-01-23 LAB — PATHOLOGIST SMEAR REVIEW

## 2016-01-24 ENCOUNTER — Ambulatory Visit
Admission: RE | Admit: 2016-01-24 | Discharge: 2016-01-24 | Disposition: A | Discharge status: Home / Self Care | Payer: Self-pay | Source: Ambulatory Visit | Attending: Family Medicine | Admitting: Family Medicine

## 2016-01-24 ENCOUNTER — Other Ambulatory Visit (HOSPITAL_COMMUNITY): Payer: Self-pay | Admitting: Family Medicine

## 2016-01-24 DIAGNOSIS — R7612 Nonspecific reaction to cell mediated immunity measurement of gamma interferon antigen response without active tuberculosis: Secondary | ICD-10-CM | POA: Insufficient documentation

## 2016-01-24 LAB — CSF CULTURE: CULTURE: NO GROWTH

## 2016-01-24 LAB — CSF CULTURE W GRAM STAIN

## 2016-01-27 ENCOUNTER — Other Ambulatory Visit: Payer: Self-pay | Admitting: Neurology

## 2016-01-27 DIAGNOSIS — R200 Anesthesia of skin: Secondary | ICD-10-CM

## 2016-01-27 DIAGNOSIS — R202 Paresthesia of skin: Principal | ICD-10-CM

## 2016-01-28 ENCOUNTER — Ambulatory Visit: Payer: Medicare PPO

## 2016-02-08 ENCOUNTER — Ambulatory Visit
Admission: RE | Admit: 2016-02-08 | Discharge: 2016-02-08 | Disposition: A | Discharge status: Home / Self Care | Payer: Medicare PPO | Source: Ambulatory Visit | Attending: Neurology | Admitting: Neurology

## 2016-02-08 DIAGNOSIS — M4802 Spinal stenosis, cervical region: Secondary | ICD-10-CM | POA: Insufficient documentation

## 2016-02-08 DIAGNOSIS — R202 Paresthesia of skin: Secondary | ICD-10-CM | POA: Insufficient documentation

## 2016-02-08 DIAGNOSIS — R200 Anesthesia of skin: Secondary | ICD-10-CM | POA: Insufficient documentation

## 2016-02-08 DIAGNOSIS — M79602 Pain in left arm: Secondary | ICD-10-CM | POA: Diagnosis present

## 2016-02-08 MED ORDER — GADOBENATE DIMEGLUMINE 529 MG/ML IV SOLN
20.0000 mL | Freq: Once | INTRAVENOUS | Status: AC | PRN
  Administered 2016-02-08: 20 mL via INTRAVENOUS

## 2016-02-17 DIAGNOSIS — R9089 Other abnormal findings on diagnostic imaging of central nervous system: Secondary | ICD-10-CM | POA: Insufficient documentation

## 2016-02-22 ENCOUNTER — Other Ambulatory Visit: Payer: Self-pay | Admitting: Family Medicine

## 2016-02-22 DIAGNOSIS — E119 Type 2 diabetes mellitus without complications: Secondary | ICD-10-CM

## 2016-02-26 ENCOUNTER — Encounter: Payer: Self-pay | Admitting: Physician Assistant

## 2016-02-26 ENCOUNTER — Ambulatory Visit (INDEPENDENT_AMBULATORY_CARE_PROVIDER_SITE_OTHER): Payer: Medicare PPO | Admitting: Physician Assistant

## 2016-02-26 VITALS — BP 120/60 | HR 77 | Temp 98.3°F | Resp 16 | Wt 213.8 lb

## 2016-02-26 DIAGNOSIS — E119 Type 2 diabetes mellitus without complications: Secondary | ICD-10-CM

## 2016-02-26 DIAGNOSIS — E7800 Pure hypercholesterolemia, unspecified: Secondary | ICD-10-CM

## 2016-02-26 DIAGNOSIS — I10 Essential (primary) hypertension: Secondary | ICD-10-CM

## 2016-02-26 DIAGNOSIS — Z23 Encounter for immunization: Secondary | ICD-10-CM | POA: Diagnosis not present

## 2016-02-26 DIAGNOSIS — Z1239 Encounter for other screening for malignant neoplasm of breast: Secondary | ICD-10-CM

## 2016-02-26 DIAGNOSIS — Z1231 Encounter for screening mammogram for malignant neoplasm of breast: Secondary | ICD-10-CM | POA: Diagnosis not present

## 2016-02-26 MED ORDER — GLUCOSE BLOOD VI STRP: ORAL_STRIP | 1 refills | Status: AC

## 2016-02-26 NOTE — Patient Instructions (Signed)
Fat and Cholesterol Restricted Diet Introduction Getting too much fat and cholesterol in your diet may cause health problems. Following this diet helps keep your fat and cholesterol at normal levels. This can keep you from getting sick. What types of fat should I choose?  Choose monosaturated and polyunsaturated fats. These are found in foods such as olive oil, canola oil, flaxseeds, walnuts, almonds, and seeds.  Eat more omega-3 fats. Good choices include salmon, mackerel, sardines, tuna, flaxseed oil, and ground flaxseeds.  Limit saturated fats. These are in animal products such as meats, butter, and cream. They can also be in plant products such as palm oil, palm kernel oil, and coconut oil.  Avoid foods with partially hydrogenated oils in them. These contain trans fats. Examples of foods that have trans fats are stick margarine, some tub margarines, cookies, crackers, and other baked goods. What general guidelines do I need to follow?  Check food labels. Look for the words "trans fat" and "saturated fat."  When preparing a meal:  Fill half of your plate with vegetables and green salads.  Fill one fourth of your plate with whole grains. Look for the word "whole" as the first word in the ingredient list.  Fill one fourth of your plate with lean protein foods.  Eat more foods that have fiber, like apples, carrots, beans, peas, and barley.  Eat more home-cooked foods. Eat less at restaurants and buffets.  Limit or avoid alcohol.  Limit foods high in starch and sugar.  Limit fried foods.  Cook foods without frying them. Baking, boiling, grilling, and broiling are all great options.  Lose weight if you are overweight. Losing even a small amount of weight can help your overall health. It can also help prevent diseases such as diabetes and heart disease. What foods can I eat? Grains  Whole grains, such as whole wheat or whole grain breads, crackers, cereals, and pasta. Unsweetened  oatmeal, bulgur, barley, quinoa, or brown rice. Corn or whole wheat flour tortillas. Vegetables  Fresh or frozen vegetables (raw, steamed, roasted, or grilled). Green salads. Fruits  All fresh, canned (in natural juice), or frozen fruits. Meat and Other Protein Products  Ground beef (85% or leaner), grass-fed beef, or beef trimmed of fat. Skinless chicken or turkey. Ground chicken or turkey. Pork trimmed of fat. All fish and seafood. Eggs. Dried beans, peas, or lentils. Unsalted nuts or seeds. Unsalted canned or dry beans. Dairy  Low-fat dairy products, such as skim or 1% milk, 2% or reduced-fat cheeses, low-fat ricotta or cottage cheese, or plain low-fat yogurt. Fats and Oils  Tub margarines without trans fats. Light or reduced-fat mayonnaise and salad dressings. Avocado. Olive, canola, sesame, or safflower oils. Natural peanut or almond butter (choose ones without added sugar and oil). The items listed above may not be a complete list of recommended foods or beverages. Contact your dietitian for more options.  What foods are not recommended? Grains  White bread. White pasta. White rice. Cornbread. Bagels, pastries, and croissants. Crackers that contain trans fat. Vegetables  White potatoes. Corn. Creamed or fried vegetables. Vegetables in a cheese sauce. Fruits  Dried fruits. Canned fruit in light or heavy syrup. Fruit juice. Meat and Other Protein Products  Fatty cuts of meat. Ribs, chicken wings, bacon, sausage, bologna, salami, chitterlings, fatback, hot dogs, bratwurst, and packaged luncheon meats. Liver and organ meats. Dairy  Whole or 2% milk, cream, half-and-half, and cream cheese. Whole milk cheeses. Whole-fat or sweetened yogurt. Full-fat cheeses. Nondairy creamers and   whipped toppings. Processed cheese, cheese spreads, or cheese curds. Sweets and Desserts  Corn syrup, sugars, honey, and molasses. Candy. Jam and jelly. Syrup. Sweetened cereals. Cookies, pies, cakes, donuts,  muffins, and ice cream. Fats and Oils  Butter, stick margarine, lard, shortening, ghee, or bacon fat. Coconut, palm kernel, or palm oils. Beverages  Alcohol. Sweetened drinks (such as sodas, lemonade, and fruit drinks or punches). The items listed above may not be a complete list of foods and beverages to avoid. Contact your dietitian for more information.  This information is not intended to replace advice given to you by your health care provider. Make sure you discuss any questions you have with your health care provider. Document Released: 08/25/2011 Document Revised: 10/31/2015 Document Reviewed: 05/25/2013  2017 Elsevier  

## 2016-02-26 NOTE — Progress Notes (Signed)
[name] Female    DOB: May 10, 1940   75 y.o.   MRN: [id number] Visit Date: 02/26/2016  Today's Provider: Mar Daring, PA-C   Chief Complaint  Patient presents with  . Follow-up     6 months follow-up Diabetes, HLD,HTN   Subjective:    HPI Diabetes:Patient is here today for six months follow-up Diabetes. She is followed by [facility]. She was just sen by Dr. Maretta Bees on 11/29. Patient was advised to stop Actos and start Metformin extended release and take 500 MG with dinner each day. Invokana was increased to 300 MG once daily and to continue Glipizide 10 MG each morning.   Hypertension, follow-up:  BP Readings from Last 3 Encounters:  02/26/16 120/60  01/21/16 120/64  01/20/16 131/61    She was last seen for hypertension 6 months ago.  BP at that visit was 124/64. She reports excellent compliance with treatment. She is not having side effects.  She is not exercising. She is not adherent to low salt diet.   She is experiencing none.  Patient denies chest pain, chest pressure/discomfort, exertional chest pressure/discomfort, fatigue, lower extremity edema and palpitations.   Cardiovascular risk factors include advanced age (older than 63 for men, 19 for women), diabetes mellitus, dyslipidemia and hypertension.     Wt Readings from Last 3 Encounters:  02/26/16 213 lb 12.8 oz (97 kg)  01/21/16 221 lb (100.2 kg)  01/14/16 218 lb (98.9 kg)    Lipid/Cholesterol, Follow-up:   Last seen for this 6 months ago.  Management changes since that visit include none. . Last Lipid Panel:    Component Value Date/Time   CHOL 183 09/04/2015 0804   TRIG 146 09/04/2015 0804   HDL 55 09/04/2015 0804   CHOLHDL 2.4 09/19/2014 0943   LDLCALC 99 09/04/2015 0804    Risk factors for vascular disease include diabetes mellitus, hypercholesterolemia and hypertension  She reports excellent compliance with treatment. She is not having side  effects.  Current symptoms include none and have been stable.  Wt Readings from Last 3 Encounters:  02/26/16 213 lb 12.8 oz (97 kg)  01/21/16 221 lb (100.2 kg)  01/14/16 218 lb (98.9 kg)   She has been followed by Dr. 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 She is scheduled to have another CT today with Dr. Hart Robinsons for further evaluation and consideration for scheduling a biopsy. 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   She also complains of some mild GI upset this morning. She reports mild nausea and diarrhea. No vomiting, constipation, melena or hematochezia. She feels it may possibly be from nervousness about imaging and upcoming procedures. -------------------------------------------------------------------     Allergies  Allergen Reactions  . Biaxin [Clarithromycin] Nausea And Vomiting  . Codeine Nausea And Vomiting  . Lisinopril Cough  . Metformin Nausea Only  . Morphine Nausea And Vomiting  . Ciprofloxacin Itching, Nausea Only and Rash     Current Outpatient Prescriptions:  .  aspirin 81 MG tablet, Take 81 mg by mouth daily. , Disp: , Rfl:  .  Blood Glucose Monitoring Suppl (TRUE METRIX AIR GLUCOSE METER) DEVI, 1 Device by Does not apply route daily. To check blood sugar once a day., Disp: 1 Device, Rfl: 0 .  Blood Glucose Monitoring Suppl (TRUE METRIX AIR GLUCOSE METER) w/Device KIT, , Disp: , Rfl:  .  calcium-vitamin D (OSCAL WITH  D) 500-200 MG-UNIT per tablet, Take 1 tablet by mouth 2 (two) times daily. , Disp: , Rfl:  .  Cholecalciferol 1000 UNITS capsule, Take 1,000 Units by mouth daily. , Disp: , Rfl:  .  cyanocobalamin 1000 MCG tablet, Take 1,000 mcg by mouth daily. , Disp: , Rfl:  .  fluticasone (FLONASE) 50 MCG/ACT nasal spray, Place 2 sprays into both nostrils daily., Disp: 48 g, Rfl: 3 .  glipiZIDE (GLUCOTROL XL) 5 MG 24 hr tablet, TAKE 1 TABLET EVERY DAY WITH BREAKFAST  (Patient taking differently: TAKE 2 TABLET EVERY DAY WITH BREAKFAST), Disp: 90 tablet, Rfl: 1 .  glucose blood (TRUE METRIX BLOOD GLUCOSE TEST) test strip, Use to check blood sugar once a day.  DX: E11.9., Disp: 100 each, Rfl: 1 .  hydrochlorothiazide (HYDRODIURIL) 12.5 MG tablet, TAKE 1 TABLET EVERY DAY, Disp: 90 tablet, Rfl: 1 .  HYDROcodone-acetaminophen (NORCO/VICODIN) 5-325 MG tablet, 1/2 to one every 6 hours as needed for arm pain, Disp: 28 tablet, Rfl: 0 .  INVOKANA 300 MG TABS tablet, , Disp: , Rfl:  .  metFORMIN (GLUCOPHAGE-XR) 500 MG 24 hr tablet, Take by mouth., Disp: , Rfl:  .  omeprazole (PRILOSEC) 20 MG capsule, Take 1 capsule (20 mg total) by mouth daily., Disp: 30 capsule, Rfl: 0 .  potassium chloride SA (K-DUR,KLOR-CON) 20 MEQ tablet, TAKE 1 TABLET EVERY DAY, Disp: 90 tablet, Rfl: 1 .  simvastatin (ZOCOR) 20 MG tablet, TAKE 1 TABLET AT BEDTIME, Disp: 90 tablet, Rfl: 1 .  timolol (TIMOPTIC) 0.5 % ophthalmic solution, Place 1 drop into both eyes daily. , Disp: , Rfl:  .  TRUEPLUS LANCETS 28G MISC, To check blood sugar once a day., Disp: 100 each, Rfl: 3 .  valsartan (DIOVAN) 80 MG tablet, TAKE 1 TABLET EVERY DAY, Disp: 90 tablet, Rfl: 1 .  gabapentin (NEURONTIN) 300 MG capsule, Take 1 capsule (300 mg total) by mouth 3 (three) times daily. (Patient not taking: Reported on 02/26/2016), Disp: 90 capsule, Rfl: 0  Review of Systems  Constitutional: Negative for chills, fatigue and fever.  Respiratory: Negative.   Cardiovascular: Negative for chest pain, palpitations and leg swelling.  Gastrointestinal: Positive for diarrhea (this morning) and nausea. Negative for abdominal distention, abdominal pain, anal bleeding, blood in stool, constipation and vomiting.  Genitourinary: Negative.   Neurological: Positive for dizziness, weakness (left UE) and numbness (left UE). Negative for headaches.  Psychiatric/Behavioral: Positive for dysphoric mood (slightly sad because she lost her son this  time of year 2 years ago to massive MI).    Social History  Substance Use Topics  . Smoking status: Former Smoker    Quit date: 07/17/1984  . Smokeless tobacco: Never Used  . Alcohol use No   Objective:   BP 120/60 (BP Location: Right Arm, Patient Position: Sitting, Cuff Size: Large)   Pulse 77   Temp 98.3 F (36.8 C) (Oral)   Resp 16   Wt 213 lb 12.8 oz (97 kg)   BMI 33.49 kg/m   Physical Exam  Constitutional: She appears well-developed and well-nourished. No distress.  Neck: Normal range of motion. Neck supple. No tracheal deviation present. No thyromegaly present.  Cardiovascular: Normal rate, regular rhythm and normal heart sounds.  Exam reveals no gallop and no friction rub.   No murmur heard. Pulmonary/Chest: Effort normal and breath sounds normal. No respiratory distress. She has no wheezes. She has no rales.  Musculoskeletal: She exhibits no edema.  Lymphadenopathy:    She has no cervical adenopathy.  Skin: She is not diaphoretic.  Vitals reviewed.     Assessment & Plan:     1. Essential hypertension Stable. Continue current medical treatment plan with HCTZ 12.48m and valsartan 849m Labs recently checked on 02/17/16 by Dr. MoHart RobinsonsI will see her back in 6 months for AWV and labs.  2. Type 2 diabetes mellitus without complication, without long-term current use of insulin ([place]CDover Base HousingFollowed by Dr. AbEvalee MuttonContinue metformin ER 50028mglipizide ER 40m34mnd Invokana 300mg81m glucose blood (TRUE METRIX BLOOD GLUCOSE TEST) test strip; Use to check blood sugar twice daily per endocrine.  DX: E11.9.  Dispense: 200 each; Refill: 1  3. Hypercholesteremia Last check was WNL. Continue simvastatin 20mg.54ml recheck in 6 months (patient deferred because she has had so many labs done recently.   4. Breast cancer screening Due for mammogram after 03/14/16. [facility] so she may call and schedule at her  convenience.  - MM Digital Screening; Future  5. Need for influenza vaccination Flu vaccine given today without complication. Patient sat upright for 15 minutes to check for adverse reaction before being released. - Flu vaccine HIGH DOSE PF (Fluzone High dose)       JennifMar Daring  [place]

## 2016-03-09 DIAGNOSIS — I639 Cerebral infarction, unspecified: Secondary | ICD-10-CM

## 2016-03-09 HISTORY — DX: Cerebral infarction, unspecified: I63.9

## 2016-03-30 LAB — MICROALBUMIN, URINE

## 2016-03-30 LAB — HEMOGLOBIN A1C: Hemoglobin A1C: 8.5

## 2016-04-24 ENCOUNTER — Other Ambulatory Visit: Payer: Self-pay | Admitting: Physician Assistant

## 2016-05-01 ENCOUNTER — Ambulatory Visit
Admission: RE | Admit: 2016-05-01 | Discharge: 2016-05-01 | Disposition: A | Discharge status: Home / Self Care | Payer: Medicare PPO | Source: Ambulatory Visit | Attending: Physician Assistant | Admitting: Physician Assistant

## 2016-05-01 DIAGNOSIS — Z1231 Encounter for screening mammogram for malignant neoplasm of breast: Secondary | ICD-10-CM | POA: Insufficient documentation

## 2016-05-01 DIAGNOSIS — Z1239 Encounter for other screening for malignant neoplasm of breast: Secondary | ICD-10-CM

## 2016-05-04 ENCOUNTER — Telehealth: Payer: Self-pay

## 2016-05-04 NOTE — Telephone Encounter (Signed)
Pt advised of results. Renaldo Fiddler, CMA

## 2016-05-04 NOTE — Telephone Encounter (Signed)
-----   Message from Mar Daring, [place] sent at 05/01/2016  5:38 PM EST ----- Normal mammogram. Repeat screening in one year.

## 2016-07-14 LAB — HEMOGLOBIN A1C: Hemoglobin A1C: 8.5

## 2016-08-26 ENCOUNTER — Other Ambulatory Visit: Payer: Self-pay | Admitting: Physician Assistant

## 2016-08-27 ENCOUNTER — Ambulatory Visit: Payer: Medicare PPO

## 2016-08-27 ENCOUNTER — Ambulatory Visit (INDEPENDENT_AMBULATORY_CARE_PROVIDER_SITE_OTHER): Payer: Medicare PPO | Admitting: Physician Assistant

## 2016-08-27 ENCOUNTER — Ambulatory Visit (INDEPENDENT_AMBULATORY_CARE_PROVIDER_SITE_OTHER): Payer: Medicare PPO

## 2016-08-27 ENCOUNTER — Encounter: Payer: Self-pay | Admitting: Physician Assistant

## 2016-08-27 VITALS — BP 120/62 | HR 60 | Temp 98.3°F | Ht 67.0 in | Wt 195.2 lb

## 2016-08-27 DIAGNOSIS — E7800 Pure hypercholesterolemia, unspecified: Secondary | ICD-10-CM

## 2016-08-27 DIAGNOSIS — Z0000 Encounter for general adult medical examination without abnormal findings: Secondary | ICD-10-CM

## 2016-08-27 DIAGNOSIS — I10 Essential (primary) hypertension: Secondary | ICD-10-CM | POA: Diagnosis not present

## 2016-08-27 DIAGNOSIS — Z1211 Encounter for screening for malignant neoplasm of colon: Secondary | ICD-10-CM | POA: Diagnosis not present

## 2016-08-27 DIAGNOSIS — E119 Type 2 diabetes mellitus without complications: Secondary | ICD-10-CM | POA: Diagnosis not present

## 2016-08-27 DIAGNOSIS — Z794 Long term (current) use of insulin: Secondary | ICD-10-CM | POA: Diagnosis not present

## 2016-08-27 MED ORDER — HYDROCHLOROTHIAZIDE 12.5 MG PO TABS: 12.5000 mg | ORAL_TABLET | Freq: Every day | ORAL | 1 refills | Status: AC

## 2016-08-27 MED ORDER — POTASSIUM CHLORIDE CRYS ER 20 MEQ PO TBCR: 20.0000 meq | EXTENDED_RELEASE_TABLET | Freq: Every day | ORAL | 1 refills | Status: DC

## 2016-08-27 NOTE — Patient Instructions (Signed)
Health Maintenance for Postmenopausal Women Menopause is a normal process in which your reproductive ability comes to an end. This process happens gradually over a span of months to years, usually between the ages of 22 and 9. Menopause is complete when you have missed 12 consecutive menstrual periods. It is important to talk with your health care provider about some of the most common conditions that affect postmenopausal women, such as heart disease, cancer, and bone loss (osteoporosis). Adopting a healthy lifestyle and getting preventive care can help to promote your health and wellness. Those actions can also lower your chances of developing some of these common conditions. What should I know about menopause? During menopause, you may experience a number of symptoms, such as:  Moderate-to-severe hot flashes.  Night sweats.  Decrease in sex drive.  Mood swings.  Headaches.  Tiredness.  Irritability.  Memory problems.  Insomnia.  Choosing to treat or not to treat menopausal changes is an individual decision that you make with your health care provider. What should I know about hormone replacement therapy and supplements? Hormone therapy products are effective for treating symptoms that are associated with menopause, such as hot flashes and night sweats. Hormone replacement carries certain risks, especially as you become older. If you are thinking about using estrogen or estrogen with progestin treatments, discuss the benefits and risks with your health care provider. What should I know about heart disease and stroke? Heart disease, heart attack, and stroke become more likely as you age. This may be due, in part, to the hormonal changes that your body experiences during menopause. These can affect how your body processes dietary fats, triglycerides, and cholesterol. Heart attack and stroke are both medical emergencies. There are many things that you can do to help prevent heart disease  and stroke:  Have your blood pressure checked at least every 1-2 years. High blood pressure causes heart disease and increases the risk of stroke.  If you are 53-22 years old, ask your health care provider if you should take aspirin to prevent a heart attack or a stroke.  Do not use any tobacco products, including cigarettes, chewing tobacco, or electronic cigarettes. If you need help quitting, ask your health care provider.  It is important to eat a healthy diet and maintain a healthy weight. ? Be sure to include plenty of vegetables, fruits, low-fat dairy products, and lean protein. ? Avoid eating foods that are high in solid fats, added sugars, or salt (sodium).  Get regular exercise. This is one of the most important things that you can do for your health. ? Try to exercise for at least 150 minutes each week. The type of exercise that you do should increase your heart rate and make you sweat. This is known as moderate-intensity exercise. ? Try to do strengthening exercises at least twice each week. Do these in addition to the moderate-intensity exercise.  Know your numbers.Ask your health care provider to check your cholesterol and your blood glucose. Continue to have your blood tested as directed by your health care provider.  What should I know about cancer screening? There are several types of cancer. Take the following steps to reduce your risk and to catch any cancer development as early as possible. Breast Cancer  Practice breast self-awareness. ? This means understanding how your breasts normally appear and feel. ? It also means doing regular breast self-exams. Let your health care provider know about any changes, no matter how small.  If you are 40  or older, have a clinician do a breast exam (clinical breast exam or CBE) every year. Depending on your age, family history, and medical history, it may be recommended that you also have a yearly breast X-ray (mammogram).  If you  have a family history of breast cancer, talk with your health care provider about genetic screening.  If you are at high risk for breast cancer, talk with your health care provider about having an MRI and a mammogram every year.  Breast cancer (BRCA) gene test is recommended for women who have family members with BRCA-related cancers. Results of the assessment will determine the need for genetic counseling and BRCA1 and for BRCA2 testing. BRCA-related cancers include these types: ? Breast. This occurs in males or females. ? Ovarian. ? Tubal. This may also be called fallopian tube cancer. ? Cancer of the abdominal or pelvic lining (peritoneal cancer). ? Prostate. ? Pancreatic.  Cervical, Uterine, and Ovarian Cancer Your health care provider may recommend that you be screened regularly for cancer of the pelvic organs. These include your ovaries, uterus, and vagina. This screening involves a pelvic exam, which includes checking for microscopic changes to the surface of your cervix (Pap test).  For women ages 21-65, health care providers may recommend a pelvic exam and a Pap test every three years. For women ages 79-65, they may recommend the Pap test and pelvic exam, combined with testing for human papilloma virus (HPV), every five years. Some types of HPV increase your risk of cervical cancer. Testing for HPV may also be done on women of any age who have unclear Pap test results.  Other health care providers may not recommend any screening for nonpregnant women who are considered low risk for pelvic cancer and have no symptoms. Ask your health care provider if a screening pelvic exam is right for you.  If you have had past treatment for cervical cancer or a condition that could lead to cancer, you need Pap tests and screening for cancer for at least 20 years after your treatment. If Pap tests have been discontinued for you, your risk factors (such as having a new sexual partner) need to be  reassessed to determine if you should start having screenings again. Some women have medical problems that increase the chance of getting cervical cancer. In these cases, your health care provider may recommend that you have screening and Pap tests more often.  If you have a family history of uterine cancer or ovarian cancer, talk with your health care provider about genetic screening.  If you have vaginal bleeding after reaching menopause, tell your health care provider.  There are currently no reliable tests available to screen for ovarian cancer.  Lung Cancer Lung cancer screening is recommended for adults 69-62 years old who are at high risk for lung cancer because of a history of smoking. A yearly low-dose CT scan of the lungs is recommended if you:  Currently smoke.  Have a history of at least 30 pack-years of smoking and you currently smoke or have quit within the past 15 years. A pack-year is smoking an average of one pack of cigarettes per day for one year.  Yearly screening should:  Continue until it has been 15 years since you quit.  Stop if you develop a health problem that would prevent you from having lung cancer treatment.  Colorectal Cancer  This type of cancer can be detected and can often be prevented.  Routine colorectal cancer screening usually begins at  age 42 and continues through age 45.  If you have risk factors for colon cancer, your health care provider may recommend that you be screened at an earlier age.  If you have a family history of colorectal cancer, talk with your health care provider about genetic screening.  Your health care provider may also recommend using home test kits to check for hidden blood in your stool.  A small camera at the end of a tube can be used to examine your colon directly (sigmoidoscopy or colonoscopy). This is done to check for the earliest forms of colorectal cancer.  Direct examination of the colon should be repeated every  5-10 years until age 71. However, if early forms of precancerous polyps or small growths are found or if you have a family history or genetic risk for colorectal cancer, you may need to be screened more often.  Skin Cancer  Check your skin from head to toe regularly.  Monitor any moles. Be sure to tell your health care provider: ? About any new moles or changes in moles, especially if there is a change in a mole's shape or color. ? If you have a mole that is larger than the size of a pencil eraser.  If any of your family members has a history of skin cancer, especially at a young age, talk with your health care provider about genetic screening.  Always use sunscreen. Apply sunscreen liberally and repeatedly throughout the day.  Whenever you are outside, protect yourself by wearing long sleeves, pants, a wide-brimmed hat, and sunglasses.  What should I know about osteoporosis? Osteoporosis is a condition in which bone destruction happens more quickly than new bone creation. After menopause, you may be at an increased risk for osteoporosis. To help prevent osteoporosis or the bone fractures that can happen because of osteoporosis, the following is recommended:  If you are 46-71 years old, get at least 1,000 mg of calcium and at least 600 mg of vitamin D per day.  If you are older than age 55 but younger than age 65, get at least 1,200 mg of calcium and at least 600 mg of vitamin D per day.  If you are older than age 54, get at least 1,200 mg of calcium and at least 800 mg of vitamin D per day.  Smoking and excessive alcohol intake increase the risk of osteoporosis. Eat foods that are rich in calcium and vitamin D, and do weight-bearing exercises several times each week as directed by your health care provider. What should I know about how menopause affects my mental health? Depression may occur at any age, but it is more common as you become older. Common symptoms of depression  include:  Low or sad mood.  Changes in sleep patterns.  Changes in appetite or eating patterns.  Feeling an overall lack of motivation or enjoyment of activities that you previously enjoyed.  Frequent crying spells.  Talk with your health care provider if you think that you are experiencing depression. What should I know about immunizations? It is important that you get and maintain your immunizations. These include:  Tetanus, diphtheria, and pertussis (Tdap) booster vaccine.  Influenza every year before the flu season begins.  Pneumonia vaccine.  Shingles vaccine.  Your health care provider may also recommend other immunizations. This information is not intended to replace advice given to you by your health care provider. Make sure you discuss any questions you have with your health care provider. Document Released: 04/17/2005  Document Revised: 09/13/2015 Document Reviewed: 11/27/2014 [employer] Interactive Patient Education  2018 [employer]  

## 2016-08-27 NOTE — Progress Notes (Deleted)
[name], Female    DOB: 1940/07/10, 76 y.o.   MRN: [id number] Visit Date: 08/27/2016  Today's Provider: [facility] PROVIDER   Chief Complaint  Patient presents with  . Medicare Wellness   Subjective:    Annual physical exam Kathleen Campos is a 76 y.o. female who presents today for health maintenance and complete physical. She feels {DESC; WELL/FAIRLY WELL/POORLY:18703}. She reports exercising ***. She reports she is sleeping {DESC; WELL/FAIRLY WELL/POORLY:18703}.  -----------------------------------------------------------------   Review of Systems  Social History      She  reports that she quit smoking about 32 years ago. She has never used smokeless tobacco. She reports that she does not drink alcohol or use drugs.       Social History   Social History  . Marital status: Married    Spouse name: Kyung Rudd  . Number of children: 3  . Years of education: 12   Occupational History  . private caregiver    Social History Main Topics  . Smoking status: Former Smoker    Quit date: 07/17/1984  . Smokeless tobacco: Never Used  . Alcohol use No  . Drug use: No  . Sexual activity: Not on file   Other Topics Concern  . Not on file   Social History Narrative  . No narrative on file    Past Medical History:  Diagnosis Date  . Diabetes mellitus without complication ([place])   . GERD (gastroesophageal reflux disease)   . Hyperlipidemia   . Hypertension      Patient Active Problem List   Diagnosis Date Noted  . Nausea 05/02/2015  . GERD (gastroesophageal reflux disease) 04/24/2015  . Dizziness 04/24/2015  . Elevated troponin 04/24/2015  . Otitis externa 12/21/2014  . Allergic rhinitis 07/18/2014  . Colon polyp 07/18/2014  . Acid reflux 07/18/2014  . Hypercholesteremia 07/18/2014  . Low serum cobalamin 07/18/2014  . Adult BMI 30+ 07/18/2014  . Avitaminosis D 07/18/2014  . Diabetes mellitus, type 2 ([place]) 09/09/1998  . 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 HTN (hypertension) 06/04/1994    Past Surgical History:  Procedure Laterality Date  . BACK SURGERY  2003  . CERVICAL BIOPSY  W/ LOOP ELECTRODE EXCISION  2010  . CHOLECYSTECTOMY  1986    Family History        Family Status  Relation Status  . Mother Deceased at age 47  . Father Deceased at age 21  . Sister Alive  . Brother Alive  . Sister Alive        Her family history includes Diabetes in her brother; Heart disease in her mother and sister; Hypertension in her brother, mother, and sister; Lung cancer in her father.     Allergies  Allergen Reactions  . Biaxin [Clarithromycin] Nausea And Vomiting  . Codeine Nausea And Vomiting  . Lisinopril Cough  . Metformin Nausea Only  . Morphine Nausea And Vomiting  . Ciprofloxacin Itching, Nausea Only and Rash     Current Outpatient Prescriptions:  .  aspirin 81 MG tablet, Take 81 mg by mouth daily. , Disp: , Rfl:  .  Blood Glucose Monitoring Suppl (TRUE METRIX AIR GLUCOSE METER) DEVI, 1 Device by Does not apply route daily. To check blood sugar once a day., Disp: 1 Device, Rfl: 0 .  calcium-vitamin D (OSCAL WITH D) 500-200 MG-UNIT per tablet, Take 1 tablet by mouth 2 (two) times daily. , Disp: , Rfl:  .  Cholecalciferol 1000 UNITS  capsule, Take 1,000 Units by mouth daily. , Disp: , Rfl:  .  cyanocobalamin 1000 MCG tablet, Take 1,000 mcg by mouth daily. , Disp: , Rfl:  .  fluticasone (FLONASE) 50 MCG/ACT nasal spray, Place 2 sprays into both nostrils daily., Disp: 48 g, Rfl: 3 .  glipiZIDE (GLUCOTROL XL) 5 MG 24 hr tablet, TAKE 1 TABLET EVERY DAY WITH BREAKFAST (Patient taking differently: TAKE 10 MG TABLETS TWICE A DAY WITH BREAKFAST), Disp: 90 tablet, Rfl: 1 .  glucose blood (TRUE METRIX BLOOD GLUCOSE TEST) test strip, Use to check blood sugar twice daily per endocrine.  DX: E11.9., Disp: 200 each, Rfl: 1 .  hydrochlorothiazide (HYDRODIURIL) 12.5 MG tablet, TAKE 1 TABLET EVERY DAY, Disp: 90 tablet, Rfl: 1 .   INVOKANA 300 MG TABS tablet, 300 mg daily before breakfast. , Disp: , Rfl:  .  omeprazole (PRILOSEC) 20 MG capsule, Take 1 capsule (20 mg total) by mouth daily., Disp: 30 capsule, Rfl: 0 .  potassium chloride SA (K-DUR,KLOR-CON) 20 MEQ tablet, TAKE 1 TABLET EVERY DAY, Disp: 90 tablet, Rfl: 1 .  simvastatin (ZOCOR) 20 MG tablet, TAKE 1 TABLET AT BEDTIME, Disp: 90 tablet, Rfl: 1 .  timolol (TIMOPTIC) 0.5 % ophthalmic solution, Place 1 drop into both eyes daily. , Disp: , Rfl:  .  TRUEPLUS LANCETS 28G MISC, To check blood sugar once a day., Disp: 100 each, Rfl: 3 .  valsartan (DIOVAN) 80 MG tablet, TAKE 1 TABLET EVERY DAY, Disp: 90 tablet, Rfl: 1   Patient Care Team: Mar Daring, PA-C as PCP - General (Family Medicine)      Objective:   Vitals: BP 120/62   Pulse 60   Temp 98.3 F (36.8 C) (Oral)   Ht 5\' 7"  (1.702 m)   Wt 195 lb 3.2 oz (88.5 kg)   BMI 30.57 kg/m    Vitals:   08/27/16 0937  BP: 120/62  Pulse: 60  Temp: 98.3 F (36.8 C)  TempSrc: Oral  Weight: 195 lb 3.2 oz (88.5 kg)  Height: 5\' 7"  (1.702 m)     Physical Exam   Depression Screen PHQ 2/9 Scores 09/19/2014 09/19/2014  PHQ - 2 Score 0 0      Assessment & Plan:     Routine Health Maintenance and Physical Exam  Exercise Activities and Dietary recommendations Goals    None      Immunization History  Administered Date(s) Administered  . Influenza Split 02/11/2011, 12/11/2011  . Influenza, High Dose Seasonal PF 01/24/2014, 02/15/2015, 02/26/2016  . Influenza,inj,Quad PF,36+ Mos 01/16/2013  . Pneumococcal Conjugate-13 05/17/2013  . Pneumococcal Polysaccharide-23 12/11/2011  . Zoster 10/10/2007    Health Maintenance  Topic Date Due  . OPHTHALMOLOGY EXAM  12/31/1951  . TETANUS/TDAP  12/30/1960  . COLONOSCOPY  04/05/2014  . FOOT EXAM  09/19/2015  . HEMOGLOBIN A1C  03/04/2016  . INFLUENZA VACCINE  10/07/2016  . MAMMOGRAM  05/01/2018  . DEXA SCAN  Completed  . PNA vac Low Risk Adult   Completed     Discussed health benefits of physical activity, and encouraged her to engage in regular exercise appropriate for her age and condition.    --------------------------------------------------------------------    [facility] [place]

## 2016-08-27 NOTE — Progress Notes (Signed)
Subjective:   Kathleen Campos is a 76 y.o. female who presents for Medicare Annual (Subsequent) preventive examination.  Review of Systems:  N/A  Cardiac Risk Factors include: advanced age (>21mn, >>56women);diabetes mellitus;obesity (BMI >30kg/m2);dyslipidemia;hypertension     Objective:     Vitals: BP 120/62   Pulse 60   Temp 98.3 F (36.8 C) (Oral)   Ht '5\' 7"'  (1.702 m)   Wt 195 lb 3.2 oz (88.5 kg)   BMI 30.57 kg/m   Body mass index is 30.57 kg/m.   Tobacco History  Smoking Status  . Former Smoker  . Quit date: 07/17/1984  Smokeless Tobacco  . Never Used     Counseling given: Not Answered   Past Medical History:  Diagnosis Date  . [place]   . GERD (gastroesophageal reflux disease)   . Hyperlipidemia   . Hypertension    Past Surgical History:  Procedure Laterality Date  . BACK SURGERY  2003  . CERVICAL BIOPSY  W/ LOOP ELECTRODE EXCISION  2010  . CHOLECYSTECTOMY  1986   Family History  Problem Relation Age of Onset  . Hypertension Mother   . Heart disease Mother   . Lung cancer Father   . Heart disease Sister   . Hypertension Sister   . Hypertension Brother   . Diabetes Brother    History  Sexual Activity  . Sexual activity: Not on file    Outpatient Encounter Prescriptions as of 08/27/2016  Medication Sig  . aspirin 81 MG tablet Take 81 mg by mouth daily.   . Blood Glucose Monitoring Suppl (TRUE METRIX AIR GLUCOSE METER) DEVI 1 Device by Does not apply route daily. To check blood sugar once a day.  . calcium-vitamin D (OSCAL WITH D) 500-200 MG-UNIT per tablet Take 1 tablet by mouth 2 (two) times daily.   . Cholecalciferol 1000 UNITS capsule Take 1,000 Units by mouth daily.   . cyanocobalamin 1000 MCG tablet Take 1,000 mcg by mouth daily.   . fluticasone (FLONASE) 50 MCG/ACT nasal spray Place 2 sprays into both nostrils daily.  .Marland KitchenglipiZIDE (GLUCOTROL) 10 MG tablet Take 2 tablets by mouth 2 (two) times  daily.  .Marland Kitchenglucose blood (TRUE METRIX BLOOD GLUCOSE TEST) test strip Use to check blood sugar twice daily per endocrine.  DX: E11.9.  .Marland Kitchenhydrochlorothiazide (HYDRODIURIL) 12.5 MG tablet TAKE 1 TABLET EVERY DAY  . INVOKANA 300 MG TABS tablet 300 mg daily before breakfast.   . omeprazole (PRILOSEC) 20 MG capsule Take 1 capsule (20 mg total) by mouth daily.  . potassium chloride SA (K-DUR,KLOR-CON) 20 MEQ tablet TAKE 1 TABLET EVERY DAY  . simvastatin (ZOCOR) 20 MG tablet TAKE 1 TABLET AT BEDTIME  . timolol (TIMOPTIC) 0.5 % ophthalmic solution Place 1 drop into both eyes daily.   . TRUEPLUS LANCETS 28G MISC To check blood sugar once a day.  . TRULICITY 1.5 MRA/0.7MASOPN Inject as directed once a week.   . valsartan (DIOVAN) 80 MG tablet TAKE 1 TABLET EVERY DAY  . [DISCONTINUED] glipiZIDE (GLUCOTROL XL) 5 MG 24 hr tablet TAKE 1 TABLET EVERY DAY WITH BREAKFAST (Patient taking differently: TAKE 10 MG TABLETS TWICE A DAY WITH BREAKFAST)  . [DISCONTINUED] Blood Glucose Monitoring Suppl (TRUE METRIX AIR GLUCOSE METER) w/Device KIT   . [DISCONTINUED] gabapentin (NEURONTIN) 300 MG capsule Take 1 capsule (300 mg total) by mouth 3 (three) times daily. (Patient not taking: Reported on 02/26/2016)  . [DISCONTINUED] HYDROcodone-acetaminophen (NORCO/VICODIN) 5-325 MG tablet  1/2 to one every 6 hours as needed for arm pain  . [DISCONTINUED] metFORMIN (GLUCOPHAGE-XR) 500 MG 24 hr tablet Take by mouth.   No facility-administered encounter medications on file as of 08/27/2016.     Activities of Daily Living In your present state of health, do you have any difficulty performing the following activities: 08/27/2016  Hearing? N  Vision? Y  Difficulty concentrating or making decisions? N  Walking or climbing stairs? Y  Dressing or bathing? N  Doing errands, shopping? N  Preparing Food and eating ? N  Using the Toilet? N  In the past six months, have you accidently leaked urine? N  Do you have problems with loss of  bowel control? N  Managing your Medications? N  Managing your Finances? N  Housekeeping or managing your Housekeeping? N  Some recent data might be hidden    Patient Care Team: Mar Daring, PA-C as PCP - General (Family Medicine) Estill Cotta, MD as Consulting Physician (Ophthalmology) Mickie Hillier, MD as Referring Physician (Psychiatry) Graceann Congress, Domenica Reamer, MD as Referring Physician (Internal Medicine) Barnett Applebaum as Consulting Physician    Assessment:     Exercise Activities and Dietary recommendations Current Exercise Habits: The patient does not participate in regular exercise at present (cleaning and yardwork), Exercise limited by: None identified  Goals    . Exercise 3x per week (30 min per time)          Recommend increasing exercise. Pt to start walking 3 days a week for 20-30 minutes.       Fall Risk Fall Risk  08/27/2016 09/19/2014 09/19/2014 09/07/2014  Falls in the past year? No No No No   Depression Screen PHQ 2/9 Scores 08/27/2016 08/27/2016 09/19/2014 09/19/2014  PHQ - 2 Score 0 0 0 0  PHQ- 9 Score 1 - - -     Cognitive Function     6CIT Screen 08/27/2016  What Year? 0 points  What month? 0 points  What time? 0 points  Count back from 20 0 points  Months in reverse 0 points  Repeat phrase 2 points  Total Score 2    Immunization History  Administered Date(s) Administered  . Influenza Split 02/11/2011, 12/11/2011  . Influenza, High Dose Seasonal PF 01/24/2014, 02/15/2015, 02/26/2016  . Influenza,inj,Quad PF,36+ Mos 01/16/2013  . Pneumococcal Conjugate-13 05/17/2013  . Pneumococcal Polysaccharide-23 12/11/2011  . Zoster 10/10/2007   Screening Tests Health Maintenance  Topic Date Due  . HEMOGLOBIN A1C  03/04/2016  . COLONOSCOPY  02/06/2017 (Originally 04/05/2014)  . TETANUS/TDAP  08/07/2017 (Originally 12/30/1960)  . INFLUENZA VACCINE  10/07/2016  . FOOT EXAM  05/13/2017  . OPHTHALMOLOGY EXAM  05/19/2017  . MAMMOGRAM   05/01/2018  . DEXA SCAN  Completed  . PNA vac Low Risk Adult  Completed      Plan:  I have personally reviewed and addressed the Medicare Annual Wellness questionnaire and have noted the following in the patient's chart:  A. Medical and social history B. Use of alcohol, tobacco or illicit drugs  C. Current medications and supplements D. Functional ability and status E.  Nutritional status F.  Physical activity G. Advance directives H. List of other physicians I.  Hospitalizations, surgeries, and ER visits in previous 12 months J.  [place] such as hearing and vision if needed, cognitive and depression L. Referrals and appointments - none  In addition, I have reviewed and discussed with patient certain preventive protocols, quality metrics, and best practice recommendations. A  written personalized care plan for preventive services as well as general preventive health recommendations were provided to patient.  See attached scanned questionnaire for additional information.   Signed,  Fabio Neighbors, LPN Nurse Health Advisor   MD Recommendations: Pt needs a Hgb A1c done today. Colonoscopy referral sent today. Pt declined tetanus vaccine due to insurance coverage.

## 2016-08-27 NOTE — Progress Notes (Signed)
[name], Female    DOB: 01/22/1941, 76 y.o.   MRN: [id number] Visit Date: 08/27/2016  Today's Provider: Mar Daring, PA-C   Chief Complaint  Patient presents with  . Annual Exam   Subjective:    Annual physical exam Kathleen Campos Kathleen Campos is a 76 y.o. female who presents today for health maintenance and complete physical. She feels well. She reports exercising none. She reports she is sleeping well.  Cologuard: 01/2015 (result?), had fecal fit test through [facility] in 2017 (result?) BMD 07/25/2012: Normal Lowest T score was -0.4 Mammogram 05/01/16: Normal BiRads 1 ----------------------------------------------------------------- She is followed by Endocrine for Type 2 diabetes mellitus. She was seen on 07/14/2016 A1C 8.5. She is scheduled for blood work on 09/10 and is scheduled for office visit 11/23/16. Diabetic eye exams are done every 6 months with Dr. Sandra Cockayne, most recently on 05/19/16 and f/u scheduled for 11/2016.  Dr. 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 I am unable to retrieve her most recent notes with him from 03/30/16 and 06/01/16 due to a computer glitch.   Declined Tdap vaccine.  Review of Systems  Constitutional: Positive for activity change and unexpected weight change.  HENT: Positive for sneezing.   Eyes: Positive for itching.  Respiratory: Negative.   Cardiovascular: Negative.   Gastrointestinal: Positive for nausea.  Endocrine: Negative.   Genitourinary: Negative.   Musculoskeletal: Negative.   Skin: Negative.   Allergic/Immunologic: Negative.   Neurological: Negative.   Hematological: Negative.   Psychiatric/Behavioral: Negative.     Social History      She  reports that she quit smoking about 32 years ago. She has never used smokeless tobacco. She reports that she does not drink alcohol or use drugs.       Social History   Social History  . Marital status: Married    Spouse name: Kyung Rudd    . Number of children: 3  . Years of education: 12   Occupational History  . private caregiver    Social History Main Topics  . Smoking status: Former Smoker    Quit date: 07/17/1984  . Smokeless tobacco: Never Used  . Alcohol use No  . Drug use: No  . Sexual activity: Not on file   Other Topics Concern  . Not on file   Social History Narrative  . No narrative on file    Past Medical History:  Diagnosis Date  . Diabetes mellitus without complication ([place])   . GERD (gastroesophageal reflux disease)   . Hyperlipidemia   . Hypertension      Patient Active Problem List   Diagnosis Date Noted  . Nausea 05/02/2015  . GERD (gastroesophageal reflux disease) 04/24/2015  . Dizziness 04/24/2015  . Elevated troponin 04/24/2015  . Otitis externa 12/21/2014  . Allergic rhinitis 07/18/2014  . Colon polyp 07/18/2014  . Acid reflux 07/18/2014  . Hypercholesteremia 07/18/2014  . Low serum cobalamin 07/18/2014  . Adult BMI 30+ 07/18/2014  . Avitaminosis D 07/18/2014  . Diabetes mellitus, type 2 ([place]) 09/09/1998  . AG (atrophic gastritis) 03/06/1998  . HTN (hypertension) 06/04/1994    Past Surgical History:  Procedure Laterality Date  . BACK SURGERY  2003  . CERVICAL BIOPSY  W/ LOOP ELECTRODE EXCISION  2010  . CHOLECYSTECTOMY  1986    Family History        Family Status  Relation Status  . Mother Deceased at age 81  . Father Deceased at age 53  .  Sister Alive  . Brother Alive  . Sister Alive        Her family history includes Diabetes in her brother; Heart disease in her mother and sister; Hypertension in her brother, mother, and sister; Lung cancer in her father.     Allergies  Allergen Reactions  . Biaxin [Clarithromycin] Nausea And Vomiting  . Codeine Nausea And Vomiting  . Lisinopril Cough  . Metformin Nausea Only  . Morphine Nausea And Vomiting  . Ciprofloxacin Itching, Nausea Only and Rash     Current Outpatient Prescriptions:  .  aspirin 81 MG  tablet, Take 81 mg by mouth daily. , Disp: , Rfl:  .  Blood Glucose Monitoring Suppl (TRUE METRIX AIR GLUCOSE METER) DEVI, 1 Device by Does not apply route daily. To check blood sugar once a day., Disp: 1 Device, Rfl: 0 .  calcium-vitamin D (OSCAL WITH D) 500-200 MG-UNIT per tablet, Take 1 tablet by mouth 2 (two) times daily. , Disp: , Rfl:  .  Cholecalciferol 1000 UNITS capsule, Take 1,000 Units by mouth daily. , Disp: , Rfl:  .  cyanocobalamin 1000 MCG tablet, Take 1,000 mcg by mouth daily. , Disp: , Rfl:  .  fluticasone (FLONASE) 50 MCG/ACT nasal spray, Place 2 sprays into both nostrils daily., Disp: 48 g, Rfl: 3 .  glipiZIDE (GLUCOTROL) 10 MG tablet, Take 2 tablets by mouth 2 (two) times daily., Disp: , Rfl:  .  glucose blood (TRUE METRIX BLOOD GLUCOSE TEST) test strip, Use to check blood sugar twice daily per endocrine.  DX: E11.9., Disp: 200 each, Rfl: 1 .  hydrochlorothiazide (HYDRODIURIL) 12.5 MG tablet, TAKE 1 TABLET EVERY DAY, Disp: 90 tablet, Rfl: 1 .  INVOKANA 300 MG TABS tablet, 300 mg daily before breakfast. , Disp: , Rfl:  .  omeprazole (PRILOSEC) 20 MG capsule, Take 1 capsule (20 mg total) by mouth daily., Disp: 30 capsule, Rfl: 0 .  potassium chloride SA (K-DUR,KLOR-CON) 20 MEQ tablet, TAKE 1 TABLET EVERY DAY, Disp: 90 tablet, Rfl: 1 .  simvastatin (ZOCOR) 20 MG tablet, TAKE 1 TABLET AT BEDTIME, Disp: 90 tablet, Rfl: 1 .  timolol (TIMOPTIC) 0.5 % ophthalmic solution, Place 1 drop into both eyes daily. , Disp: , Rfl:  .  TRUEPLUS LANCETS 28G MISC, To check blood sugar once a day., Disp: 100 each, Rfl: 3 .  TRULICITY 1.5 ZE/0.9QZ SOPN, Inject as directed once a week. , Disp: , Rfl:  .  valsartan (DIOVAN) 80 MG tablet, TAKE 1 TABLET EVERY DAY, Disp: 90 tablet, Rfl: 1   Patient Care Team: Mar Daring, PA-C as PCP - General (Family Medicine) Dingeldein, Remo Lipps, MD as Consulting Physician (Ophthalmology) Mickie Hillier, MD as Referring Physician (Psychiatry) Abisogun,  Domenica Reamer, MD as Referring Physician (Internal Medicine) Barnett Applebaum as Consulting Physician      Objective:   Vitals:  BP 120/62   Pulse 60   Temp 98.3 F (36.8 C) (Oral)   Ht 5\' 7"  (1.702 m)   Wt 195 lb 3.2 oz (88.5 kg)   BMI 30.57 kg/m   Body mass index is 30.57 kg/m.   Physical Exam  Constitutional: She is oriented to person, place, and time. She appears well-developed and well-nourished. No distress.  HENT:  Head: Normocephalic and atraumatic.  Right Ear: Hearing, tympanic membrane, external ear and ear canal normal.  Left Ear: Hearing, tympanic membrane, external ear and ear canal normal.  Nose: Nose normal.  Mouth/Throat: Uvula is midline, oropharynx is clear and moist and  mucous membranes are normal. No oropharyngeal exudate.  Eyes: Conjunctivae and EOM are normal. Pupils are equal, round, and reactive to light. Right eye exhibits no discharge. Left eye exhibits no discharge. No scleral icterus.  Neck: Normal range of motion. Neck supple. No JVD present. Carotid bruit is not present. No tracheal deviation present. No thyromegaly present.  Cardiovascular: Normal rate, regular rhythm, normal heart sounds and intact distal pulses.  Exam reveals no gallop and no friction rub.   No murmur heard. Pulmonary/Chest: Effort normal and breath sounds normal. No respiratory distress. She has no wheezes. She has no rales. She exhibits no tenderness.  Abdominal: Soft. Bowel sounds are normal. She exhibits no distension and no mass. There is no tenderness. There is no rebound and no guarding.  Musculoskeletal: Normal range of motion. She exhibits no edema or tenderness.  Lymphadenopathy:    She has no cervical adenopathy.  Neurological: She is alert and oriented to person, place, and time.  Skin: Skin is warm and dry. No rash noted. She is not diaphoretic.  Psychiatric: She has a normal mood and affect. Her behavior is normal. Judgment and thought content normal.  Vitals  reviewed.    Depression Screen PHQ 2/9 Scores 08/27/2016 08/27/2016 09/19/2014 09/19/2014  PHQ - 2 Score 0 0 0 0  PHQ- 9 Score 1 - - -      Assessment & Plan:     Routine Health Maintenance and Physical Exam  Exercise Activities and Dietary recommendations Goals    . Exercise 3x per week (30 min per time)          Recommend increasing exercise. Pt to start walking 3 days a week for 20-30 minutes.        Immunization History  Administered Date(s) Administered  . Influenza Split 02/11/2011, 12/11/2011  . Influenza, High Dose Seasonal PF 01/24/2014, 02/15/2015, 02/26/2016  . Influenza,inj,Quad PF,36+ Mos 01/16/2013  . Pneumococcal Conjugate-13 05/17/2013  . Pneumococcal Polysaccharide-23 12/11/2011  . Zoster 10/10/2007    Health Maintenance  Topic Date Due  . HEMOGLOBIN A1C  03/04/2016  . COLONOSCOPY  02/06/2017 (Originally 04/05/2014)  . TETANUS/TDAP  08/07/2017 (Originally 12/30/1960)  . INFLUENZA VACCINE  10/07/2016  . FOOT EXAM  05/13/2017  . OPHTHALMOLOGY EXAM  05/19/2017  . MAMMOGRAM  05/01/2018  . DEXA SCAN  Completed  . PNA vac Low Risk Adult  Completed     Discussed health benefits of physical activity, and encouraged her to engage in regular exercise appropriate for her age and condition.    1. Annual physical exam Normal physical exam today. Patient refused td vaccination due to not covered by insurance.  2. Essential hypertension Stable. Continue Valsartan 80mg , Hctz 12.5mg . Will check labs as below and f/u pending results. - CBC w/Diff/Platelet - Comprehensive Metabolic Panel (CMET) - Lipid Profile - potassium chloride SA (K-DUR,KLOR-CON) 20 MEQ tablet; Take 1 tablet (20 mEq total) by mouth daily.  Dispense: 90 tablet; Refill: 1 - hydrochlorothiazide (HYDRODIURIL) 12.5 MG tablet; Take 1 tablet (12.5 mg total) by mouth daily.  Dispense: 90 tablet; Refill: 1  3. Type 2 diabetes mellitus without complication, with long-term current use of insulin  (HCC) Followed by Endocrinology, Dr. Graceann Congress. - CBC w/Diff/Platelet - Comprehensive Metabolic Panel (CMET) - Lipid Profile  4. Hypercholesteremia Stable on Simvastatin 20mg . Will check labs as below and f/u pending results. - CBC w/Diff/Platelet - Comprehensive Metabolic Panel (CMET) - Lipid Profile  --------------------------------------------------------------------    Mar Daring, PA-C  [facility]

## 2016-08-27 NOTE — Patient Instructions (Signed)
[name] , Thank you for taking time to come for your Medicare Wellness Visit. I appreciate your ongoing commitment to your health goals. Please review the following plan we discussed and let me know if I can assist you in the future.   Screening recommendations/referrals: Colonoscopy: referral sent today Mammogram: completed 05/01/16, due 04/2017 Bone Density: completed 07/25/12 Recommended yearly ophthalmology/optometry visit for glaucoma screening and checkup Recommended yearly dental visit for hygiene and checkup  Vaccinations: Influenza vaccine: up to date, due 11/2016 Pneumococcal vaccine: completed series Tdap vaccine: declined Shingles vaccine: completed 10/10/07  Advanced directives: Please bring a copy of your POA (Power of [place]) and/or Living Will to your next appointment once updated.   Conditions/risks identified: Obesity; Recommend increasing exercise. Pt to start walking 3 days a week for 20-30 minutes.    Next appointment: None, need to schedule 1 year AWV.   Preventive Care 65 Years and Older, Female Preventive care refers to lifestyle choices and visits with your health care provider that can promote health and wellness. What does preventive care include?  A yearly physical exam. This is also called an annual well check.  Dental exams once or twice a year.  Routine eye exams. Ask your health care provider how often you should have your eyes checked.  Personal lifestyle choices, including:  Daily care of your teeth and gums.  Regular physical activity.  Eating a healthy diet.  Avoiding tobacco and drug use.  Limiting alcohol use.  Practicing safe sex.  Taking low-dose aspirin every day.  Taking vitamin and mineral supplements as recommended by your health care provider. What happens during an annual well check? The services and screenings done by your health care provider during your annual well check will depend on your age, overall health,  lifestyle risk factors, and family history of disease. Counseling  Your health care provider may ask you questions about your:  Alcohol use.  Tobacco use.  Drug use.  Emotional well-being.  Home and relationship well-being.  Sexual activity.  Eating habits.  History of falls.  Memory and ability to understand (cognition).  Work and work Statistician.  Reproductive health. Screening  You may have the following tests or measurements:  Height, weight, and BMI.  Blood pressure.  Lipid and cholesterol levels. These may be checked every 5 years, or more frequently if you are over 20 years old.  Skin check.  Lung cancer screening. You may have this screening every year starting at age 54 if you have a 30-pack-year history of smoking and currently smoke or have quit within the past 15 years.  Fecal occult blood test (FOBT) of the stool. You may have this test every year starting at age 29.  Flexible sigmoidoscopy or colonoscopy. You may have a sigmoidoscopy every 5 years or a colonoscopy every 10 years starting at age 27.  Hepatitis C blood test.  Hepatitis B blood test.  Sexually transmitted disease (STD) testing.  Diabetes screening. This is done by checking your blood sugar (glucose) after you have not eaten for a while (fasting). You may have this done every 1-3 years.  Bone density scan. This is done to screen for osteoporosis. You may have this done starting at age 25.  Mammogram. This may be done every 1-2 years. Talk to your health care provider about how often you should have regular mammograms. Talk with your health care provider about your test results, treatment options, and if necessary, the need for more tests. Vaccines  Your health  care provider may recommend certain vaccines, such as:  Influenza vaccine. This is recommended every year.  Tetanus, diphtheria, and acellular pertussis (Tdap, Td) vaccine. You may need a Td booster every 10 years.  Zoster  vaccine. You may need this after age 41.  Pneumococcal 13-valent conjugate (PCV13) vaccine. One dose is recommended after age 39.  Pneumococcal polysaccharide (PPSV23) vaccine. One dose is recommended after age 40. Talk to your health care provider about which screenings and vaccines you need and how often you need them. This information is not intended to replace advice given to you by your health care provider. Make sure you discuss any questions you have with your health care provider. Document Released: 03/22/2015 Document Revised: 11/13/2015 Document Reviewed: 12/25/2014 Elsevier Interactive Patient Education  2017 [place] Prevention in the Home Falls can cause injuries. They can happen to people of all ages. There are many things you can do to make your home safe and to help prevent falls. What can I do on the outside of my home?  Regularly fix the edges of walkways and driveways and fix any cracks.  Remove anything that might make you trip as you walk through a door, such as a raised step or threshold.  Trim any bushes or trees on the path to your home.  Use bright outdoor lighting.  Clear any walking paths of anything that might make someone trip, such as rocks or tools.  Regularly check to see if handrails are loose or broken. Make sure that both sides of any steps have handrails.  Any raised decks and porches should have guardrails on the edges.  Have any leaves, snow, or ice cleared regularly.  Use sand or salt on walking paths during winter.  Clean up any spills in your garage right away. This includes oil or grease spills. What can I do in the bathroom?  Use night lights.  Install grab bars by the toilet and in the tub and shower. Do not use towel bars as grab bars.  Use non-skid mats or decals in the tub or shower.  If you need to sit down in the shower, use a plastic, non-slip stool.  Keep the floor dry. Clean up any water that spills on the  floor as soon as it happens.  Remove soap buildup in the tub or shower regularly.  Attach bath mats securely with double-sided non-slip rug tape.  Do not have throw rugs and other things on the floor that can make you trip. What can I do in the bedroom?  Use night lights.  Make sure that you have a light by your bed that is easy to reach.  Do not use any sheets or blankets that are too big for your bed. They should not hang down onto the floor.  Have a firm chair that has side arms. You can use this for support while you get dressed.  Do not have throw rugs and other things on the floor that can make you trip. What can I do in the kitchen?  Clean up any spills right away.  Avoid walking on wet floors.  Keep items that you use a lot in easy-to-reach places.  If you need to reach something above you, use a strong step stool that has a grab bar.  Keep electrical cords out of the way.  Do not use floor polish or wax that makes floors slippery. If you must use wax, use non-skid floor wax.  Do not have  throw rugs and other things on the floor that can make you trip. What can I do with my stairs?  Do not leave any items on the stairs.  Make sure that there are handrails on both sides of the stairs and use them. Fix handrails that are broken or loose. Make sure that handrails are as long as the stairways.  Check any carpeting to make sure that it is firmly attached to the stairs. Fix any carpet that is loose or worn.  Avoid having throw rugs at the top or bottom of the stairs. If you do have throw rugs, attach them to the floor with carpet tape.  Make sure that you have a light switch at the top of the stairs and the bottom of the stairs. If you do not have them, ask someone to add them for you. What else can I do to help prevent falls?  Wear shoes that:  Do not have high heels.  Have rubber bottoms.  Are comfortable and fit you well.  Are closed at the toe. Do not wear  sandals.  If you use a stepladder:  Make sure that it is fully opened. Do not climb a closed stepladder.  Make sure that both sides of the stepladder are locked into place.  Ask someone to hold it for you, if possible.  Clearly mark and make sure that you can see:  Any grab bars or handrails.  First and last steps.  Where the edge of each step is.  Use tools that help you move around (mobility aids) if they are needed. These include:  Canes.  Walkers.  Scooters.  Crutches.  Turn on the lights when you go into a dark area. Replace any light bulbs as soon as they burn out.  Set up your furniture so you have a clear path. Avoid moving your furniture around.  If any of your floors are uneven, fix them.  If there are any pets around you, be aware of where they are.  Review your medicines with your doctor. Some medicines can make you feel dizzy. This can increase your chance of falling. Ask your doctor what other things that you can do to help prevent falls. This information is not intended to replace advice given to you by your health care provider. Make sure you discuss any questions you have with your health care provider. Document Released: 12/20/2008 Document Revised: 08/01/2015 Document Reviewed: 03/30/2014 Elsevier Interactive Patient Education  2017 [employer].

## 2016-08-29 LAB — CBC WITH DIFFERENTIAL/PLATELET
Basophils Absolute: 0 10*3/uL (ref 0.0–0.2)
Basos: 1 %
EOS (ABSOLUTE): 0.1 10*3/uL (ref 0.0–0.4)
Eos: 2 %
HEMOGLOBIN: 14.5 g/dL (ref 11.1–15.9)
Hematocrit: 41.7 % (ref 34.0–46.6)
IMMATURE GRANS (ABS): 0 10*3/uL (ref 0.0–0.1)
Immature Granulocytes: 0 %
LYMPHS: 31 %
Lymphocytes Absolute: 1.6 10*3/uL (ref 0.7–3.1)
MCH: 29.3 pg (ref 26.6–33.0)
MCHC: 34.8 g/dL (ref 31.5–35.7)
MCV: 84 fL (ref 79–97)
MONOCYTES: 10 %
Monocytes Absolute: 0.5 10*3/uL (ref 0.1–0.9)
NEUTROS ABS: 2.8 10*3/uL (ref 1.4–7.0)
Neutrophils: 56 %
PLATELETS: 144 10*3/uL — AB (ref 150–379)
RBC: 4.95 x10E6/uL (ref 3.77–5.28)
RDW: 13.7 % (ref 12.3–15.4)
WBC: 5 10*3/uL (ref 3.4–10.8)

## 2016-08-29 LAB — LIPID PANEL
CHOLESTEROL TOTAL: 160 mg/dL (ref 100–199)
Chol/HDL Ratio: 3.3 ratio (ref 0.0–4.4)
HDL: 48 mg/dL (ref >39)
LDL CALC: 77 mg/dL (ref 0–99)
TRIGLYCERIDES: 177 mg/dL — AB (ref 0–149)
VLDL CHOLESTEROL CAL: 35 mg/dL (ref 5–40)

## 2016-08-29 LAB — COMPREHENSIVE METABOLIC PANEL
ALK PHOS: 65 IU/L (ref 39–117)
ALT: 46 IU/L — AB (ref 0–32)
AST: 50 IU/L — ABNORMAL HIGH (ref 0–40)
Albumin/Globulin Ratio: 1.9 (ref 1.2–2.2)
Albumin: 4.3 g/dL (ref 3.5–4.8)
BILIRUBIN TOTAL: 0.2 mg/dL (ref 0.0–1.2)
BUN/Creatinine Ratio: 24 (ref 12–28)
BUN: 21 mg/dL (ref 8–27)
CHLORIDE: 97 mmol/L (ref 96–106)
CO2: 23 mmol/L (ref 20–29)
CREATININE: 0.89 mg/dL (ref 0.57–1.00)
Calcium: 9.4 mg/dL (ref 8.7–10.3)
GFR calc non Af Amer: 64 mL/min/{1.73_m2} (ref >59)
GFR, EST AFRICAN AMERICAN: 74 mL/min/{1.73_m2} (ref >59)
GLUCOSE: 200 mg/dL — AB (ref 65–99)
Globulin, Total: 2.3 g/dL (ref 1.5–4.5)
Potassium: 4.4 mmol/L (ref 3.5–5.2)
Sodium: 138 mmol/L (ref 134–144)
TOTAL PROTEIN: 6.6 g/dL (ref 6.0–8.5)

## 2016-08-31 NOTE — Progress Notes (Signed)
Advised  ED 

## 2016-09-23 ENCOUNTER — Other Ambulatory Visit: Payer: Self-pay

## 2016-09-23 ENCOUNTER — Telehealth: Payer: Self-pay

## 2016-09-23 DIAGNOSIS — Z1211 Encounter for screening for malignant neoplasm of colon: Secondary | ICD-10-CM

## 2016-09-23 DIAGNOSIS — Z86010 Personal history of colonic polyps: Secondary | ICD-10-CM

## 2016-09-23 NOTE — Telephone Encounter (Signed)
Gastroenterology Pre-Procedure Review  Request Date: 11/05/16 Requesting Physician: Dr. Vicente Males  PATIENT REVIEW QUESTIONS: The patient responded to the following health history questions as indicated:    1. Are you having any GI issues? no 2. Do you have a personal history of Polyps? yes (doesnt recall how long ago) 3. Do you have a family history of Colon Cancer or Polyps? no 4. Diabetes Mellitus? yes (type 2) 5. Joint replacements in the past 12 months?no 6. Major health problems in the past 3 months?no 7. Any artificial heart valves, MVP, or defibrillator?no    MEDICATIONS & ALLERGIES:    Patient reports the following regarding taking any anticoagulation/antiplatelet therapy:   Plavix, Coumadin, Eliquis, Xarelto, Lovenox, Pradaxa, Brilinta, or Effient? no Aspirin? yes (81mg  daily)  Patient confirms/reports the following medications:  Current Outpatient Prescriptions  Medication Sig Dispense Refill  . aspirin 81 MG tablet Take 81 mg by mouth daily.     . Blood Glucose Monitoring Suppl (TRUE METRIX AIR GLUCOSE METER) DEVI 1 Device by Does not apply route daily. To check blood sugar once a day. 1 Device 0  . calcium-vitamin D (OSCAL WITH D) 500-200 MG-UNIT per tablet Take 1 tablet by mouth 2 (two) times daily.     . Cholecalciferol 1000 UNITS capsule Take 1,000 Units by mouth daily.     . cyanocobalamin 1000 MCG tablet Take 1,000 mcg by mouth daily.     . fluticasone (FLONASE) 50 MCG/ACT nasal spray Place 2 sprays into both nostrils daily. 48 g 3  . glipiZIDE (GLUCOTROL) 10 MG tablet Take 2 tablets by mouth 2 (two) times daily.    Marland Kitchen glucose blood (TRUE METRIX BLOOD GLUCOSE TEST) test strip Use to check blood sugar twice daily per endocrine.  DX: E11.9. 200 each 1  . hydrochlorothiazide (HYDRODIURIL) 12.5 MG tablet Take 1 tablet (12.5 mg total) by mouth daily. 90 tablet 1  . INVOKANA 300 MG TABS tablet 300 mg daily before breakfast.     . potassium chloride SA (K-DUR,KLOR-CON) 20 MEQ  tablet Take 1 tablet (20 mEq total) by mouth daily. 90 tablet 1  . simvastatin (ZOCOR) 20 MG tablet TAKE 1 TABLET AT BEDTIME 90 tablet 1  . timolol (TIMOPTIC) 0.5 % ophthalmic solution Place 1 drop into both eyes daily.     . TRUEPLUS LANCETS 28G MISC To check blood sugar once a day. 100 each 3  . TRULICITY 1.5 EZ/6.6QH SOPN Inject as directed once a week.     . valsartan (DIOVAN) 80 MG tablet TAKE 1 TABLET EVERY DAY 90 tablet 1   No current facility-administered medications for this visit.     Patient confirms/reports the following allergies:  Allergies  Allergen Reactions  . Biaxin [Clarithromycin] Nausea And Vomiting  . Codeine Nausea And Vomiting  . Lisinopril Cough  . Metformin Nausea Only  . Morphine Nausea And Vomiting  . Ciprofloxacin Itching, Nausea Only and Rash    No orders of the defined types were placed in this encounter.   AUTHORIZATION INFORMATION Primary Insurance: 1D#: Group #:  Secondary Insurance: 1D#: Group #:  SCHEDULE INFORMATION: Date: 11/05/16 Time: Location:[facility]

## 2016-11-02 ENCOUNTER — Telehealth: Payer: Self-pay | Admitting: Gastroenterology

## 2016-11-02 NOTE — Telephone Encounter (Signed)
Patient is having her procedure on Thursday but is due for her Trulisity injection on Wednesday. Should she take it? Please call

## 2016-11-03 NOTE — Telephone Encounter (Signed)
Per Dr. Vicente Males patient can have Trulicity injection on the day prior to the procedure.  Trulicity helps the body to produce its own insulin but is not insulin.  Patient needs to continue to monitor blood sugar levels and take precautions and care as necessary.

## 2016-11-04 ENCOUNTER — Encounter: Payer: Self-pay | Admitting: *Deleted

## 2016-11-05 ENCOUNTER — Ambulatory Visit: Payer: Medicare PPO | Admitting: Anesthesiology

## 2016-11-05 ENCOUNTER — Encounter: Payer: Self-pay | Admitting: *Deleted

## 2016-11-05 ENCOUNTER — Encounter
Admission: RE | Disposition: A | Discharge status: Home / Self Care | Payer: Self-pay | Source: Ambulatory Visit | Attending: Gastroenterology

## 2016-11-05 ENCOUNTER — Ambulatory Visit
Admission: RE | Admit: 2016-11-05 | Discharge: 2016-11-05 | Disposition: A | Discharge status: Home / Self Care | Payer: Medicare PPO | Source: Ambulatory Visit | Attending: Gastroenterology | Admitting: Gastroenterology

## 2016-11-05 DIAGNOSIS — Z7982 Long term (current) use of aspirin: Secondary | ICD-10-CM | POA: Insufficient documentation

## 2016-11-05 DIAGNOSIS — Z8249 Family history of ischemic heart disease and other diseases of the circulatory system: Secondary | ICD-10-CM | POA: Diagnosis not present

## 2016-11-05 DIAGNOSIS — K621 Rectal polyp: Secondary | ICD-10-CM | POA: Diagnosis not present

## 2016-11-05 DIAGNOSIS — Z881 Allergy status to other antibiotic agents status: Secondary | ICD-10-CM | POA: Insufficient documentation

## 2016-11-05 DIAGNOSIS — Z86010 Personal history of colonic polyps: Secondary | ICD-10-CM | POA: Diagnosis not present

## 2016-11-05 DIAGNOSIS — Z7984 Long term (current) use of oral hypoglycemic drugs: Secondary | ICD-10-CM | POA: Diagnosis not present

## 2016-11-05 DIAGNOSIS — Z801 Family history of malignant neoplasm of trachea, bronchus and lung: Secondary | ICD-10-CM | POA: Insufficient documentation

## 2016-11-05 DIAGNOSIS — Z87891 Personal history of nicotine dependence: Secondary | ICD-10-CM | POA: Insufficient documentation

## 2016-11-05 DIAGNOSIS — E119 Type 2 diabetes mellitus without complications: Secondary | ICD-10-CM | POA: Diagnosis not present

## 2016-11-05 DIAGNOSIS — I110 Hypertensive heart disease with heart failure: Secondary | ICD-10-CM | POA: Diagnosis not present

## 2016-11-05 DIAGNOSIS — K219 Gastro-esophageal reflux disease without esophagitis: Secondary | ICD-10-CM | POA: Insufficient documentation

## 2016-11-05 DIAGNOSIS — Z8673 Personal history of transient ischemic attack (TIA), and cerebral infarction without residual deficits: Secondary | ICD-10-CM | POA: Diagnosis not present

## 2016-11-05 DIAGNOSIS — Z79899 Other long term (current) drug therapy: Secondary | ICD-10-CM | POA: Insufficient documentation

## 2016-11-05 DIAGNOSIS — Z833 Family history of diabetes mellitus: Secondary | ICD-10-CM | POA: Diagnosis not present

## 2016-11-05 DIAGNOSIS — D128 Benign neoplasm of rectum: Secondary | ICD-10-CM | POA: Insufficient documentation

## 2016-11-05 DIAGNOSIS — Z7952 Long term (current) use of systemic steroids: Secondary | ICD-10-CM | POA: Insufficient documentation

## 2016-11-05 DIAGNOSIS — Z1211 Encounter for screening for malignant neoplasm of colon: Secondary | ICD-10-CM

## 2016-11-05 DIAGNOSIS — E785 Hyperlipidemia, unspecified: Secondary | ICD-10-CM | POA: Diagnosis not present

## 2016-11-05 DIAGNOSIS — D122 Benign neoplasm of ascending colon: Secondary | ICD-10-CM | POA: Diagnosis not present

## 2016-11-05 DIAGNOSIS — Z888 Allergy status to other drugs, medicaments and biological substances status: Secondary | ICD-10-CM | POA: Diagnosis not present

## 2016-11-05 DIAGNOSIS — D120 Benign neoplasm of cecum: Secondary | ICD-10-CM | POA: Diagnosis not present

## 2016-11-05 DIAGNOSIS — K640 First degree hemorrhoids: Secondary | ICD-10-CM | POA: Diagnosis not present

## 2016-11-05 DIAGNOSIS — Z885 Allergy status to narcotic agent status: Secondary | ICD-10-CM | POA: Insufficient documentation

## 2016-11-05 HISTORY — PX: COLONOSCOPY WITH PROPOFOL: SHX5780

## 2016-11-05 HISTORY — DX: Cerebral infarction, unspecified: I63.9

## 2016-11-05 LAB — GLUCOSE, CAPILLARY: Glucose-Capillary: 209 mg/dL — ABNORMAL HIGH (ref 65–99)

## 2016-11-05 SURGERY — COLONOSCOPY WITH PROPOFOL
Anesthesia: General

## 2016-11-05 MED ORDER — EPHEDRINE SULFATE 50 MG/ML IJ SOLN
INTRAMUSCULAR | Status: DC | PRN
Start: 2016-11-05 — End: 2016-11-05
  Administered 2016-11-05: 10 mg via INTRAVENOUS

## 2016-11-05 MED ORDER — FENTANYL CITRATE (PF) 100 MCG/2ML IJ SOLN
INTRAMUSCULAR | Status: DC | PRN
  Administered 2016-11-05: 50 ug via INTRAVENOUS

## 2016-11-05 MED ORDER — PROPOFOL 10 MG/ML IV BOLUS
INTRAVENOUS | Status: DC | PRN
  Administered 2016-11-05: 100 mg via INTRAVENOUS

## 2016-11-05 MED ORDER — PHENYLEPHRINE HCL 10 MG/ML IJ SOLN
INTRAMUSCULAR | Status: DC | PRN
  Administered 2016-11-05 (×2): 100 ug via INTRAVENOUS

## 2016-11-05 MED ORDER — MIDAZOLAM HCL 2 MG/2ML IJ SOLN
INTRAMUSCULAR | Status: AC
  Filled 2016-11-05: qty 2

## 2016-11-05 MED ORDER — LIDOCAINE 2% (20 MG/ML) 5 ML SYRINGE
INTRAMUSCULAR | Status: DC | PRN
  Administered 2016-11-05: 40 mg via INTRAVENOUS

## 2016-11-05 MED ORDER — PROPOFOL 500 MG/50ML IV EMUL
INTRAVENOUS | Status: DC | PRN
  Administered 2016-11-05: 160 ug/kg/min via INTRAVENOUS

## 2016-11-05 MED ORDER — FENTANYL CITRATE (PF) 100 MCG/2ML IJ SOLN
INTRAMUSCULAR | Status: AC
  Filled 2016-11-05: qty 2

## 2016-11-05 MED ORDER — SODIUM CHLORIDE 0.9 % IV SOLN
INTRAVENOUS | Status: DC
  Administered 2016-11-05: 10:00:00 via INTRAVENOUS

## 2016-11-05 MED ORDER — PROPOFOL 500 MG/50ML IV EMUL
INTRAVENOUS | Status: AC
  Filled 2016-11-05: qty 50

## 2016-11-05 NOTE — Anesthesia Post-op Follow-up Note (Signed)
Anesthesia QCDR form completed.        

## 2016-11-05 NOTE — Op Note (Signed)
[facility] Patient Name: Kathleen Campos Procedure Date: 11/05/2016 10:25 AM MRN: [id number] Account #: [id number] Date of Birth: 01/19/1941 Admit Type: Outpatient Age: 76 Room: [facility] ENDO ROOM 4 Gender: Female Note Status: Finalized Procedure:            Colonoscopy Indications:          High risk colon cancer surveillance: Personal history                        of colonic polyps Providers:            Jonathon Bellows MD, MD Referring MD:         Mar Daring (Referring MD) Medicines:            Monitored Anesthesia Care Complications:        No immediate complications. Procedure:            Pre-Anesthesia Assessment:                       - Prior to the procedure, a History and Physical was                        performed, and patient medications, allergies and                        sensitivities were reviewed. The patient's tolerance of                        previous anesthesia was reviewed.                       - The risks and benefits of the procedure and the                        sedation options and risks were discussed with the                        patient. All questions were answered and informed                        consent was obtained.                       - ASA Grade Assessment: III - A patient with severe                        systemic disease.                       After obtaining informed consent, the colonoscope was                        passed under direct vision. Throughout the procedure,                        the patient's blood pressure, pulse, and oxygen                        saturations were monitored continuously. The                        Colonoscope  was introduced through the anus and                        advanced to the the cecum, identified by the                        appendiceal orifice, IC valve and transillumination.                        The colonoscopy was performed with ease. The patient                         tolerated the procedure well. The quality of the bowel                        preparation was good. Findings:      The perianal and digital rectal examinations were normal. [Pertinent       Negatives].      Five sessile polyps were found in the ascending colon and cecum. The       polyps were 5 to 6 mm in size. These polyps were removed with a cold       snare. Resection and retrieval were complete.      A 7 mm polyp was found in the rectum. The polyp was sessile. The polyp       was removed with a cold snare. Resection and retrieval were complete.      Non-bleeding internal hemorrhoids were found during retroflexion. The       hemorrhoids were medium-sized and Grade I (internal hemorrhoids that do       not prolapse).      The exam was otherwise without abnormality on direct and retroflexion       views. Impression:           - Five 5 to 6 mm polyps in the ascending colon and in                        the cecum, removed with a cold snare. Resected and                        retrieved.                       - One 7 mm polyp in the rectum, removed with a cold                        snare. Resected and retrieved.                       - Non-bleeding internal hemorrhoids.                       - The examination was otherwise normal on direct and                        retroflexion views. Recommendation:       - Discharge patient to home (with escort).                       - Resume previous diet.                       -  Continue present medications.                       - Await pathology results.                       - Repeat colonoscopy in 3 years for surveillance. Procedure Code(s):    --- Professional ---                       [phone], Colonoscopy, flexible; with removal of tumor(s),                        polyp(s), or other lesion(s) by snare technique Diagnosis Code(s):    --- Professional ---                       Z86.010, Personal history of colonic  polyps                       D12.2, Benign neoplasm of ascending colon                       D12.0, Benign neoplasm of cecum                       K62.1, Rectal polyp                       K64.0, First degree hemorrhoids CPT copyright 2016 American Medical Association. All rights reserved. The codes documented in this report are preliminary and upon coder review may  be revised to meet current compliance requirements. Jonathon Bellows, MD Jonathon Bellows MD, MD 11/05/2016 11:00:33 AM This report has been signed electronically. Number of Addenda: 0 Note Initiated On: 11/05/2016 10:25 AM Scope Withdrawal Time: 0 hours 15 minutes 27 seconds  Total Procedure Duration: 0 hours 18 minutes 56 seconds       [facility]

## 2016-11-05 NOTE — Anesthesia Postprocedure Evaluation (Signed)
Anesthesia Post Note  [name]  Procedure(s) Performed: Procedure(s) (LRB): COLONOSCOPY WITH PROPOFOL (N/A)  Patient location during evaluation: Endoscopy Anesthesia Type: General Level of consciousness: awake and alert Pain management: pain level controlled Vital Signs Assessment: post-procedure vital signs reviewed and stable Respiratory status: spontaneous breathing, nonlabored ventilation, respiratory function stable and patient connected to nasal cannula oxygen Cardiovascular status: blood pressure returned to baseline and stable Postop Assessment: no signs of nausea or vomiting Anesthetic complications: no     Last Vitals:  Vitals:   11/05/16 1140 11/05/16 1146  BP: (!) 90/56 (!) 94/54  Pulse: (!) 58 61  Resp: 13 (!) 7  Temp:    SpO2: 100% 100%    Last Pain:  Vitals:   11/05/16 1100  TempSrc: Tympanic                 Martha Clan

## 2016-11-05 NOTE — Anesthesia Preprocedure Evaluation (Signed)
Anesthesia Evaluation  Patient identified by MRN, date of birth, ID band Patient awake    Reviewed: Allergy & Precautions, H&P , NPO status , Patient's Chart, lab work & pertinent test results, reviewed documented beta blocker date and time   History of Anesthesia Complications Negative for: history of anesthetic complications  Airway Mallampati: II  TM Distance: >3 FB Neck ROM: full    Dental  (+) Partial Upper, Partial Lower, Dental Advidsory Given   Pulmonary neg pulmonary ROS, former smoker,           Cardiovascular Exercise Tolerance: Good hypertension, (-) angina(-) CAD, (-) Past MI, (-) Cardiac Stents and (-) CABG (-) dysrhythmias (-) Valvular Problems/Murmurs     Neuro/Psych neg Seizures TIAnegative psych ROS   GI/Hepatic Neg liver ROS, GERD  ,  Endo/Other  diabetes, Type 2, Oral Hypoglycemic Agents  Renal/GU negative Renal ROS  negative genitourinary   Musculoskeletal   Abdominal   Peds  Hematology negative hematology ROS (+)   Anesthesia Other Findings Past Medical History: No date: Diabetes mellitus without complication (HCC) No date: GERD (gastroesophageal reflux disease) No date: Hyperlipidemia No date: Hypertension 2018: Stroke ([place])     Comment:  TIA   Reproductive/Obstetrics negative OB ROS                             Anesthesia Physical Anesthesia Plan  ASA: III  Anesthesia Plan: General   Post-op Pain Management:    Induction: Intravenous  PONV Risk Score and Plan: 3 and Propofol infusion  Airway Management Planned: Natural Airway and Nasal Cannula  Additional Equipment:   Intra-op Plan:   Post-operative Plan:   Informed Consent: I have reviewed the patients History and Physical, chart, labs and discussed the procedure including the risks, benefits and alternatives for the proposed anesthesia with the patient or authorized representative who has  indicated his/her understanding and acceptance.   Dental Advisory Given  Plan Discussed with: Anesthesiologist, CRNA and Surgeon  Anesthesia Plan Comments:         Anesthesia Quick Evaluation

## 2016-11-05 NOTE — Transfer of Care (Signed)
Immediate Anesthesia Transfer of Care Note  [name]  Procedure(s) Performed: Procedure(s): COLONOSCOPY WITH PROPOFOL (N/A)  Patient Location: PACU and Endoscopy Unit  Anesthesia Type:General  Level of Consciousness: awake, drowsy and patient cooperative  Airway & Oxygen Therapy: Patient Spontanous Breathing and Patient connected to nasal cannula oxygen  Post-op Assessment: Report given to RN and Post -op Vital signs reviewed and stable  Post vital signs: Reviewed and stable  Last Vitals:  Vitals:   11/05/16 0937  BP: 110/61  Pulse: 72  Resp: 18  Temp: (!) 36.1 C  SpO2: 99%    Last Pain:  Vitals:   11/05/16 0937  TempSrc: Tympanic         Complications: No apparent anesthesia complications

## 2016-11-05 NOTE — H&P (Signed)
Jonathon Bellows MD [address], [place], [place] [address] Phone: [phone] Fax : [phone]  Primary Care Physician:  Mar Daring, PA-C Primary Gastroenterologist:  Dr. Jonathon Bellows   Pre-Procedure History & Physical: HPI:  Kathleen Campos is a 76 y.o. female is here for an colonoscopy.   Past Medical History:  Diagnosis Date  . Diabetes mellitus without complication ([employer])   . GERD (gastroesophageal reflux disease)   . Hyperlipidemia   . Hypertension   . Stroke [facility]) 2018   TIA    Past Surgical History:  Procedure Laterality Date  . BACK SURGERY  2003  . CERVICAL BIOPSY  W/ LOOP ELECTRODE EXCISION  2010  . CHOLECYSTECTOMY  1986    Prior to Admission medications   Medication Sig Start Date End Date Taking? Authorizing Provider  aspirin 81 MG tablet Take 81 mg by mouth daily.  10/04/07  Yes [provider]  calcium-vitamin D (OSCAL WITH D) 500-200 MG-UNIT per tablet Take 1 tablet by mouth 2 (two) times daily.  10/04/07  Yes [provider]  Cholecalciferol 1000 UNITS capsule Take 1,000 Units by mouth daily.    Yes [provider]  cyanocobalamin 1000 MCG tablet Take 1,000 mcg by mouth daily.    Yes [provider]  fluticasone (FLONASE) 50 MCG/ACT nasal spray Place 2 sprays into both nostrils daily. 06/03/15  Yes Margarita Rana, MD  glipiZIDE (GLUCOTROL) 10 MG tablet Take 2 tablets by mouth 2 (two) times daily. 07/22/16  Yes [provider]  hydrochlorothiazide (HYDRODIURIL) 12.5 MG tablet Take 1 tablet (12.5 mg total) by mouth daily. 08/27/16  Yes Mar Daring, PA-C  INVOKANA 300 MG TABS tablet 300 mg daily before breakfast.  02/05/16  Yes [provider]  potassium chloride SA (K-DUR,KLOR-CON) 20 MEQ tablet Take 1 tablet (20 mEq total) by mouth daily. 08/27/16  Yes Mar Daring, PA-C  simvastatin (ZOCOR) 20 MG tablet TAKE 1 TABLET AT BEDTIME 08/26/16  Yes Mar Daring, PA-C  timolol  (TIMOPTIC) 0.5 % ophthalmic solution Place 1 drop into both eyes daily.  11/13/14  Yes [provider]  TRULICITY 1.5 CH/8.5ID SOPN Inject as directed once a week.  08/26/16  Yes [provider]  valsartan (DIOVAN) 80 MG tablet TAKE 1 TABLET EVERY DAY 04/24/16  Yes Mar Daring, PA-C  Blood Glucose Monitoring Suppl (TRUE METRIX AIR GLUCOSE METER) DEVI 1 Device by Does not apply route daily. To check blood sugar once a day. 08/20/14   Margarita Rana, MD  glucose blood (TRUE METRIX BLOOD GLUCOSE TEST) test strip Use to check blood sugar twice daily per endocrine.  DX: E11.9. 02/26/16   Mar Daring, PA-C  TRUEPLUS LANCETS 28G MISC To check blood sugar once a day. 08/20/14   Margarita Rana, MD    Allergies as of 09/23/2016 - Review Complete 08/27/2016  Allergen Reaction Noted  . Biaxin [clarithromycin] Nausea And Vomiting 07/18/2014  . Codeine Nausea And Vomiting 07/18/2014  . Lisinopril Cough 07/18/2014  . Metformin Nausea Only 12/02/2015  . Morphine Nausea And Vomiting 07/18/2014  . Ciprofloxacin Itching, Nausea Only, and Rash 07/18/2014    Family History  Problem Relation Age of Onset  . Hypertension Mother   . Heart disease Mother   . Lung cancer Father   . Heart disease Sister   . Hypertension Sister   . Hypertension Brother   . Diabetes Brother     Social History   Social History  . Marital status:  Married    Spouse name: Kyung Rudd  . Number of children: 3  . Years of education: 12   Occupational History  . private caregiver    Social History Main Topics  . Smoking status: Former Smoker    Quit date: 07/17/1984  . Smokeless tobacco: Never Used  . Alcohol use No  . Drug use: No  . Sexual activity: Not on file   Other Topics Concern  . Not on file   Social History Narrative  . No narrative on file    Review of Systems: See HPI, otherwise negative ROS  Physical Exam: BP 110/61   Pulse 72   Temp (!) 97 F (36.1 C) (Tympanic)    Resp 18   Ht 5\' 7"  (1.702 m)   Wt 185 lb (83.9 kg)   SpO2 99%   BMI 28.98 kg/m  General:   Alert,  pleasant and cooperative in NAD Head:  Normocephalic and atraumatic. Neck:  Supple; no masses or thyromegaly. Lungs:  Clear throughout to auscultation.    Heart:  Regular rate and rhythm. Abdomen:  Soft, nontender and nondistended. Normal bowel sounds, without guarding, and without rebound.   Neurologic:  Alert and  oriented x4;  grossly normal neurologically.  Impression/Plan: [name] is here for an colonoscopy to be performed for surveillance due to prior history of colon polyps  Risks, benefits, limitations, and alternatives regarding  colonoscopy have been reviewed with the patient.  Questions have been answered.  All parties agreeable.   Jonathon Bellows, MD  11/05/2016, 9:46 AM

## 2016-11-06 ENCOUNTER — Encounter: Payer: Self-pay | Admitting: Gastroenterology

## 2016-11-07 LAB — SURGICAL PATHOLOGY

## 2016-11-09 ENCOUNTER — Encounter: Payer: Self-pay | Admitting: Gastroenterology

## 2016-11-12 IMAGING — CT CT HEAD W/O CM
1 series · 16 of 28 positions shown, 20 images · non-contrast
Comparison: Prior head CT 04/16/2015

CLINICAL DATA: 74-year-old female with dizziness, nausea and
lightheadedness

EXAM:
CT HEAD WITHOUT CONTRAST
TECHNIQUE: Contiguous axial images were obtained from the base of the skull
through the vertex without intravenous contrast.

[Series 2: soft tissue · axial · 0.45mm/px · z∈[-146,-21]mm · 16 of 28 slices shown, 20 images]
[im 2/28  brain]
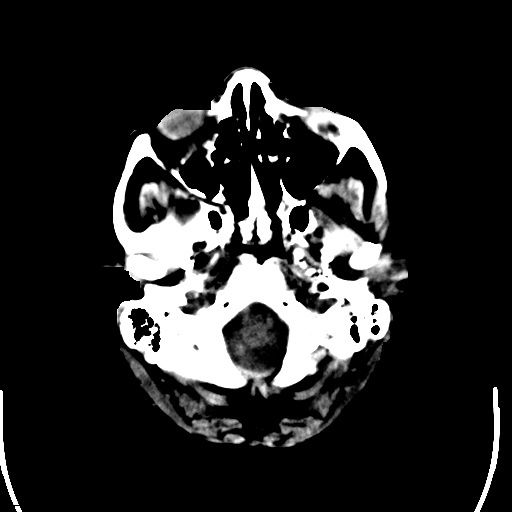
[im 2/28  bone]
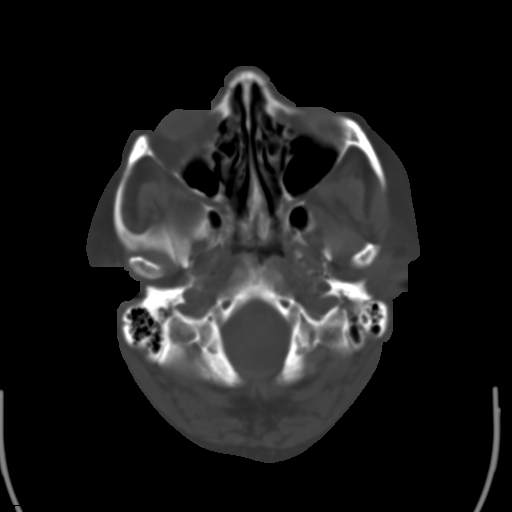
[im 4/28  brain]
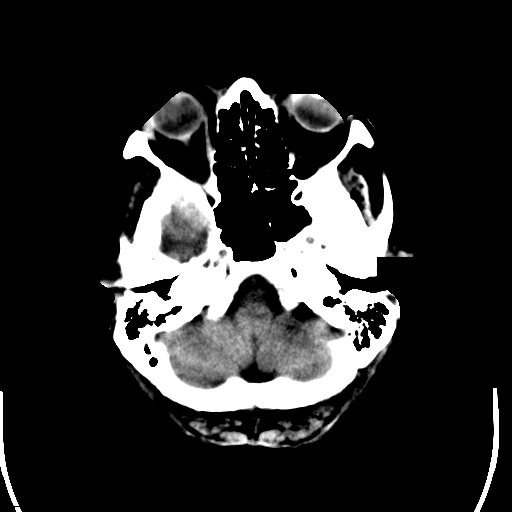
[im 6/28  brain]
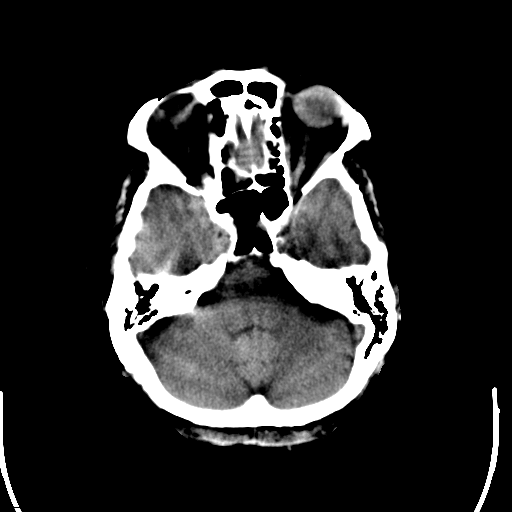
[im 7/28  brain]
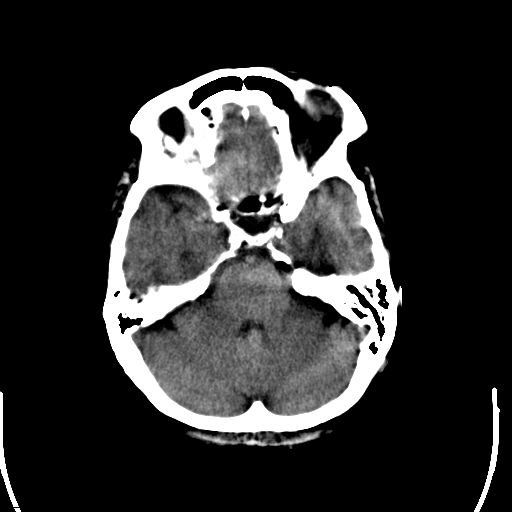
[im 9/28  brain]
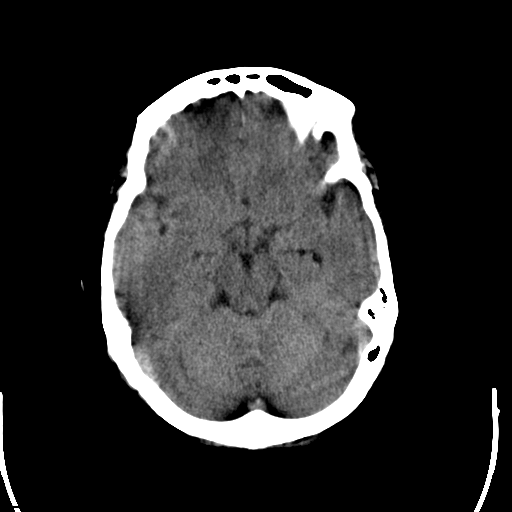
[im 9/28  bone]
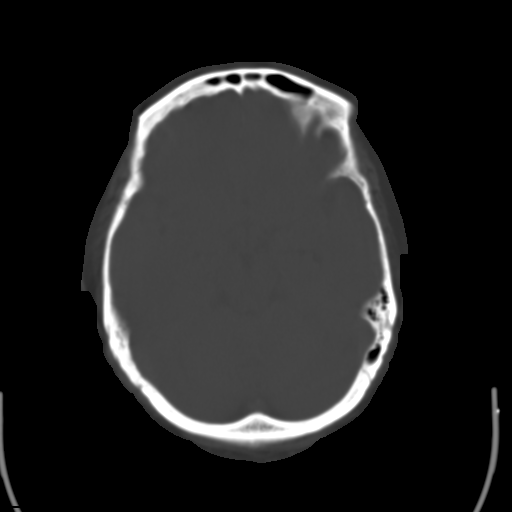
[im 10/28  brain]
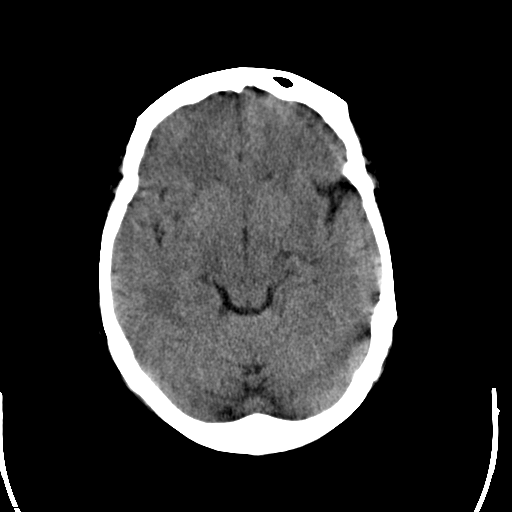
[im 12/28  brain]
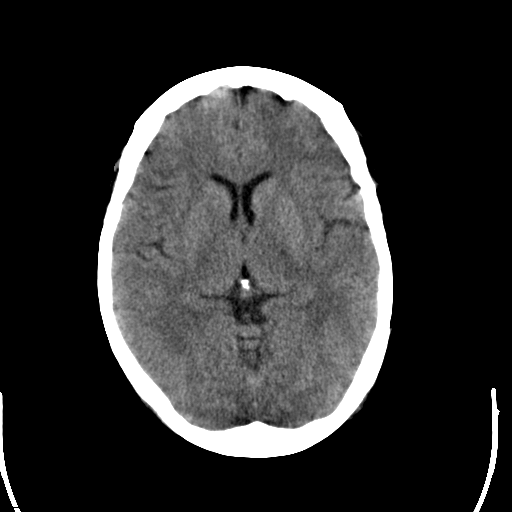
[im 14/28  brain]
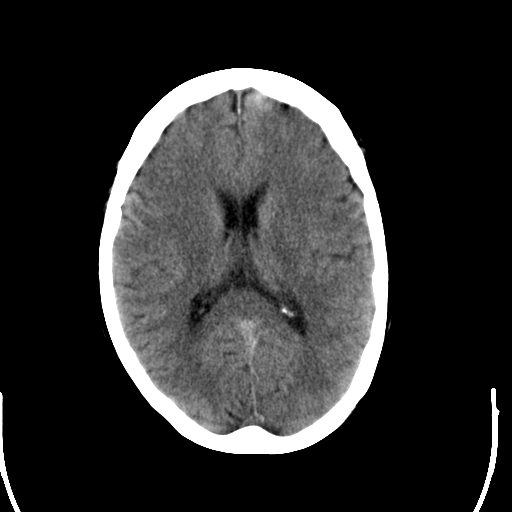
[im 15/28  brain]
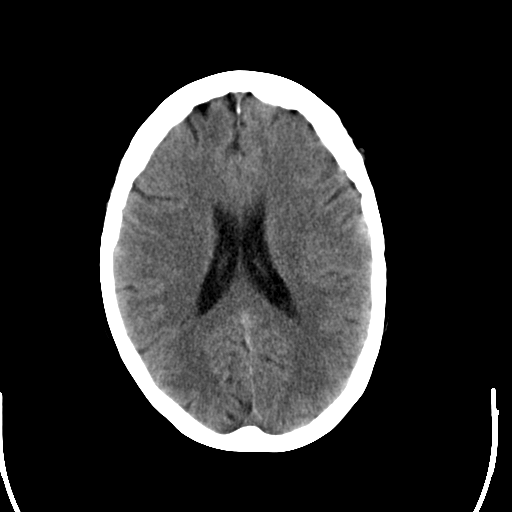
[im 15/28  bone]
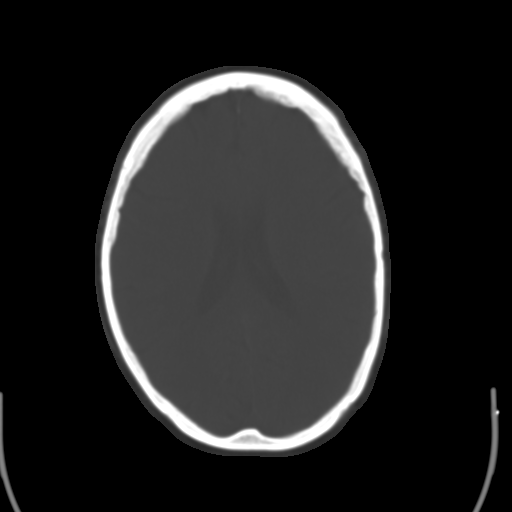
[im 17/28  brain]
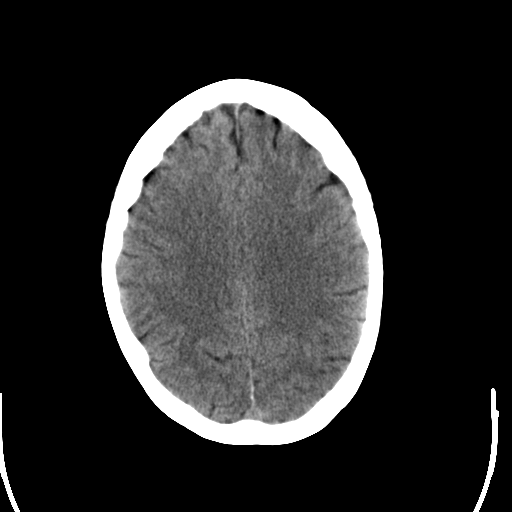
[im 19/28  brain]
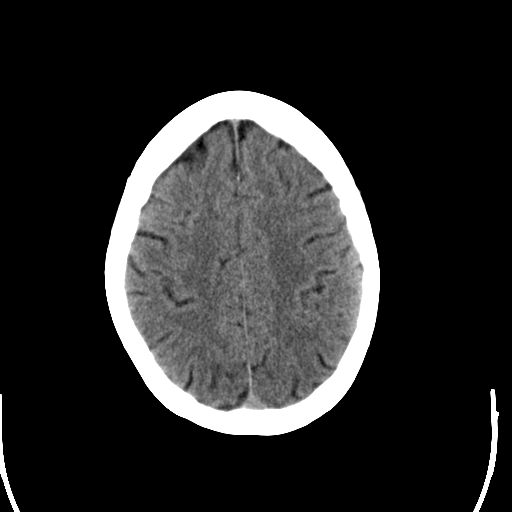
[im 20/28  brain]
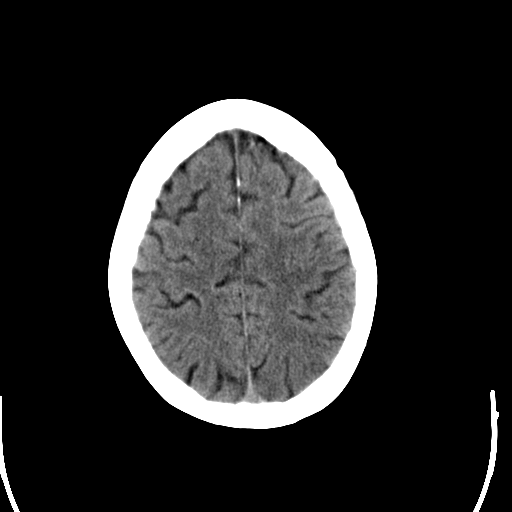
[im 22/28  brain]
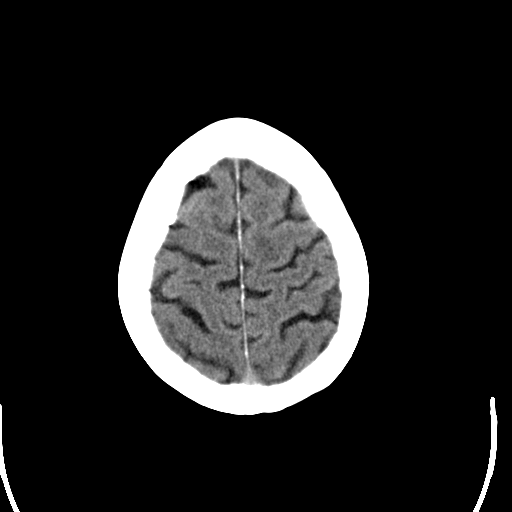
[im 22/28  bone]
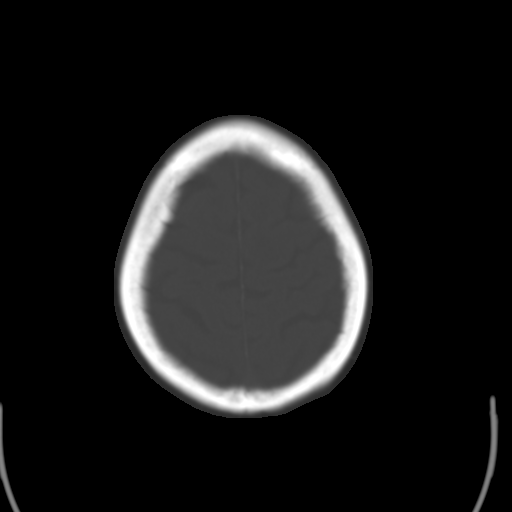
[im 23/28  brain]
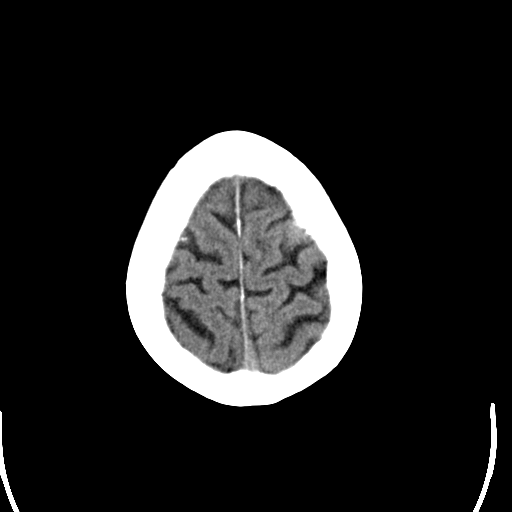
[im 25/28  brain]
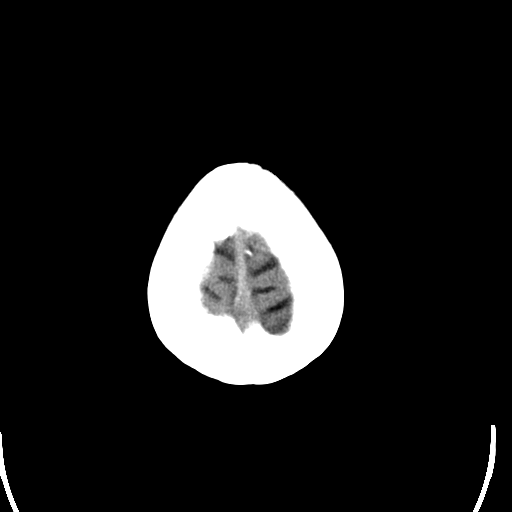
[im 27/28  brain]
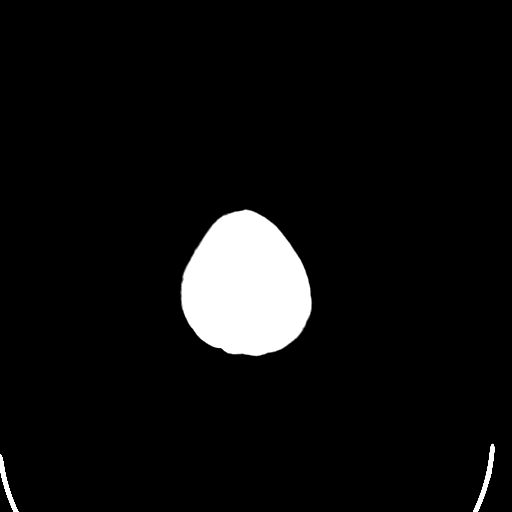

[16 of 28 positions shown; findings below may reference images not displayed]

FINDINGS: Negative for acute intracranial hemorrhage, acute infarction, mass,
mass effect, hydrocephalus or midline shift. Gray-white
differentiation is preserved throughout. Remote lacunar infarct left
basal ganglia. No focal soft tissue or calvarial abnormality. Globes
and orbits symmetric and intact bilaterally. Normal aeration of the
mastoid air cells and visualized paranasal sinuses.
IMPRESSION: 1. Negative head CT.

## 2016-11-17 LAB — BASIC METABOLIC PANEL
BUN: 17 (ref 4–21)
CREATININE: 0.8 (ref <=1.1)
Glucose: 223
POTASSIUM: 4.3 (ref 3.4–5.3)
Sodium: 138 (ref 137–147)

## 2016-11-17 LAB — LIPID PANEL
CHOLESTEROL: 175 (ref 0–200)
HDL: 54 (ref 35–70)
LDL Cholesterol: 94
TRIGLYCERIDES: 133 (ref 40–160)

## 2016-11-17 LAB — HEMOGLOBIN A1C: Hemoglobin A1C: 8.9

## 2016-11-17 LAB — HM DIABETES EYE EXAM

## 2016-11-25 DIAGNOSIS — E1169 Type 2 diabetes mellitus with other specified complication: Secondary | ICD-10-CM | POA: Insufficient documentation

## 2016-11-25 DIAGNOSIS — E785 Hyperlipidemia, unspecified: Secondary | ICD-10-CM

## 2016-11-26 ENCOUNTER — Encounter: Payer: Self-pay | Admitting: Physician Assistant

## 2016-12-10 ENCOUNTER — Encounter: Payer: Self-pay | Admitting: Physician Assistant

## 2016-12-21 ENCOUNTER — Other Ambulatory Visit: Payer: Self-pay | Admitting: Physician Assistant

## 2016-12-30 ENCOUNTER — Ambulatory Visit (INDEPENDENT_AMBULATORY_CARE_PROVIDER_SITE_OTHER): Payer: Medicare PPO

## 2016-12-30 DIAGNOSIS — Z23 Encounter for immunization: Secondary | ICD-10-CM

## 2017-01-27 ENCOUNTER — Telehealth: Payer: Self-pay | Admitting: Physician Assistant

## 2017-01-27 DIAGNOSIS — R110 Nausea: Secondary | ICD-10-CM

## 2017-01-27 DIAGNOSIS — R42 Dizziness and giddiness: Secondary | ICD-10-CM

## 2017-01-27 MED ORDER — MECLIZINE HCL 25 MG PO TABS: 25.0000 mg | ORAL_TABLET | Freq: Three times a day (TID) | ORAL | 0 refills | Status: DC | PRN

## 2017-01-27 NOTE — Telephone Encounter (Signed)
Agreed with plan

## 2017-01-27 NOTE — Telephone Encounter (Signed)
Please Review.  Thanks,  -Joseline 

## 2017-01-27 NOTE — Telephone Encounter (Signed)
Advised patient as directed below. Per patient she is feeling worse. Advised patient that is better to go to the hospital for IV fluids and evaluation and work up.Per patient her daughter is already on her way.

## 2017-01-27 NOTE — Telephone Encounter (Signed)
Pt friend, [place] called stating pt is very dizzy and having nausea.  Pt does not feel like she can get up and get dressed to come.  Pt is requesting something to help with this.  [facility] [place].  [phone]

## 2017-01-27 NOTE — Telephone Encounter (Signed)
Will send in meclizine but if patient develops severe vomiting, double vision, confusion, chest pain, or numbness or weakness on one side of the body or the other she needs to go to hospital.

## 2017-01-30 ENCOUNTER — Other Ambulatory Visit: Payer: Self-pay | Admitting: Physician Assistant

## 2017-01-30 DIAGNOSIS — I10 Essential (primary) hypertension: Secondary | ICD-10-CM

## 2017-03-04 ENCOUNTER — Other Ambulatory Visit: Payer: Self-pay | Admitting: Physician Assistant

## 2017-03-24 ENCOUNTER — Other Ambulatory Visit: Payer: Self-pay | Admitting: Physician Assistant

## 2017-03-24 DIAGNOSIS — Z1231 Encounter for screening mammogram for malignant neoplasm of breast: Secondary | ICD-10-CM

## 2017-05-03 ENCOUNTER — Ambulatory Visit
Admission: RE | Admit: 2017-05-03 | Discharge: 2017-05-03 | Disposition: A | Discharge status: Home / Self Care | Payer: Medicare PPO | Source: Ambulatory Visit | Attending: Physician Assistant | Admitting: Physician Assistant

## 2017-05-03 DIAGNOSIS — Z1231 Encounter for screening mammogram for malignant neoplasm of breast: Secondary | ICD-10-CM

## 2017-05-04 ENCOUNTER — Telehealth: Payer: Self-pay

## 2017-05-04 NOTE — Telephone Encounter (Signed)
-----   Message from Mar Daring, [place] sent at 05/03/2017  4:51 PM EST ----- Normal mammogram. Repeat screening in one year.

## 2017-05-04 NOTE — Telephone Encounter (Signed)
Patient advised as below.  

## 2017-05-12 ENCOUNTER — Ambulatory Visit (INDEPENDENT_AMBULATORY_CARE_PROVIDER_SITE_OTHER): Payer: Medicare PPO | Admitting: Physician Assistant

## 2017-05-12 ENCOUNTER — Encounter: Payer: Self-pay | Admitting: Physician Assistant

## 2017-05-12 VITALS — BP 126/70 | HR 65 | Temp 97.7°F | Wt 207.4 lb

## 2017-05-12 DIAGNOSIS — H6993 Unspecified Eustachian tube disorder, bilateral: Secondary | ICD-10-CM

## 2017-05-12 DIAGNOSIS — J069 Acute upper respiratory infection, unspecified: Secondary | ICD-10-CM | POA: Diagnosis not present

## 2017-05-12 DIAGNOSIS — I10 Essential (primary) hypertension: Secondary | ICD-10-CM | POA: Diagnosis not present

## 2017-05-12 DIAGNOSIS — H6983 Other specified disorders of Eustachian tube, bilateral: Secondary | ICD-10-CM | POA: Diagnosis not present

## 2017-05-12 MED ORDER — AMOXICILLIN 875 MG PO TABS: 875.0000 mg | ORAL_TABLET | Freq: Two times a day (BID) | ORAL | 0 refills | Status: DC

## 2017-05-12 MED ORDER — FLUTICASONE PROPIONATE 50 MCG/ACT NA SUSP: 2.0000 | Freq: Every day | NASAL | 3 refills | Status: DC

## 2017-05-12 NOTE — Patient Instructions (Signed)
DASH Eating Plan DASH stands for "Dietary Approaches to Stop Hypertension." The DASH eating plan is a healthy eating plan that has been shown to reduce high blood pressure (hypertension). It may also reduce your risk for type 2 diabetes, heart disease, and stroke. The DASH eating plan may also help with weight loss. What are tips for following this plan? General guidelines  Avoid eating more than 2,300 mg (milligrams) of salt (sodium) a day. If you have hypertension, you may need to reduce your sodium intake to 1,500 mg a day.  Limit alcohol intake to no more than 1 drink a day for nonpregnant women and 2 drinks a day for men. One drink equals 12 oz of beer, 5 oz of wine, or 1 oz of hard liquor.  Work with your health care provider to maintain a healthy body weight or to lose weight. Ask what an ideal weight is for you.  Get at least 30 minutes of exercise that causes your heart to beat faster (aerobic exercise) most days of the week. Activities may include walking, swimming, or biking.  Work with your health care provider or diet and nutrition specialist (dietitian) to adjust your eating plan to your individual calorie needs. Reading food labels  Check food labels for the amount of sodium per serving. Choose foods with less than 5 percent of the Daily Value of sodium. Generally, foods with less than 300 mg of sodium per serving fit into this eating plan.  To find whole grains, look for the word "whole" as the first word in the ingredient list. Shopping  Buy products labeled as "low-sodium" or "no salt added."  Buy fresh foods. Avoid canned foods and premade or frozen meals. Cooking  Avoid adding salt when cooking. Use salt-free seasonings or herbs instead of table salt or sea salt. Check with your health care provider or pharmacist before using salt substitutes.  Do not fry foods. Cook foods using healthy methods such as baking, boiling, grilling, and broiling instead.  Cook with  heart-healthy oils, such as olive, canola, soybean, or sunflower oil. Meal planning   Eat a balanced diet that includes: ? 5 or more servings of fruits and vegetables each day. At each meal, try to fill half of your plate with fruits and vegetables. ? Up to 6-8 servings of whole grains each day. ? Less than 6 oz of lean meat, poultry, or fish each day. A 3-oz serving of meat is about the same size as a deck of cards. One egg equals 1 oz. ? 2 servings of low-fat dairy each day. ? A serving of nuts, seeds, or beans 5 times each week. ? Heart-healthy fats. Healthy fats called Omega-3 fatty acids are found in foods such as flaxseeds and coldwater fish, like sardines, salmon, and mackerel.  Limit how much you eat of the following: ? Canned or prepackaged foods. ? Food that is high in trans fat, such as fried foods. ? Food that is high in saturated fat, such as fatty meat. ? Sweets, desserts, sugary drinks, and other foods with added sugar. ? Full-fat dairy products.  Do not salt foods before eating.  Try to eat at least 2 vegetarian meals each week.  Eat more home-cooked food and less restaurant, buffet, and fast food.  When eating at a restaurant, ask that your food be prepared with less salt or no salt, if possible. What foods are recommended? The items listed may not be a complete list. Talk with your dietitian about what   dietary choices are best for you. Grains Whole-grain or whole-wheat bread. Whole-grain or whole-wheat pasta. Brown rice. Oatmeal. Quinoa. Bulgur. Whole-grain and low-sodium cereals. Pita bread. Low-fat, low-sodium crackers. Whole-wheat flour tortillas. Vegetables Fresh or frozen vegetables (raw, steamed, roasted, or grilled). Low-sodium or reduced-sodium tomato and vegetable juice. Low-sodium or reduced-sodium tomato sauce and tomato paste. Low-sodium or reduced-sodium canned vegetables. Fruits All fresh, dried, or frozen fruit. Canned fruit in natural juice (without  added sugar). Meat and other protein foods Skinless chicken or turkey. Ground chicken or turkey. Pork with fat trimmed off. Fish and seafood. Egg whites. Dried beans, peas, or lentils. Unsalted nuts, nut butters, and seeds. Unsalted canned beans. Lean cuts of beef with fat trimmed off. Low-sodium, lean deli meat. Dairy Low-fat (1%) or fat-free (skim) milk. Fat-free, low-fat, or reduced-fat cheeses. Nonfat, low-sodium ricotta or cottage cheese. Low-fat or nonfat yogurt. Low-fat, low-sodium cheese. Fats and oils Soft margarine without trans fats. Vegetable oil. Low-fat, reduced-fat, or light mayonnaise and salad dressings (reduced-sodium). Canola, safflower, olive, soybean, and sunflower oils. Avocado. Seasoning and other foods Herbs. Spices. Seasoning mixes without salt. Unsalted popcorn and pretzels. Fat-free sweets. What foods are not recommended? The items listed may not be a complete list. Talk with your dietitian about what dietary choices are best for you. Grains Baked goods made with fat, such as croissants, muffins, or some breads. Dry pasta or rice meal packs. Vegetables Creamed or fried vegetables. Vegetables in a cheese sauce. Regular canned vegetables (not low-sodium or reduced-sodium). Regular canned tomato sauce and paste (not low-sodium or reduced-sodium). Regular tomato and vegetable juice (not low-sodium or reduced-sodium). Pickles. Olives. Fruits Canned fruit in a light or heavy syrup. Fried fruit. Fruit in cream or butter sauce. Meat and other protein foods Fatty cuts of meat. Ribs. Fried meat. Bacon. Sausage. Bologna and other processed lunch meats. Salami. Fatback. Hotdogs. Bratwurst. Salted nuts and seeds. Canned beans with added salt. Canned or smoked fish. Whole eggs or egg yolks. Chicken or turkey with skin. Dairy Whole or 2% milk, cream, and half-and-half. Whole or full-fat cream cheese. Whole-fat or sweetened yogurt. Full-fat cheese. Nondairy creamers. Whipped toppings.  Processed cheese and cheese spreads. Fats and oils Butter. Stick margarine. Lard. Shortening. Ghee. Bacon fat. Tropical oils, such as coconut, palm kernel, or palm oil. Seasoning and other foods Salted popcorn and pretzels. Onion salt, garlic salt, seasoned salt, table salt, and sea salt. Worcestershire sauce. Tartar sauce. Barbecue sauce. Teriyaki sauce. Soy sauce, including reduced-sodium. Steak sauce. Canned and packaged gravies. Fish sauce. Oyster sauce. Cocktail sauce. Horseradish that you find on the shelf. Ketchup. Mustard. Meat flavorings and tenderizers. Bouillon cubes. Hot sauce and Tabasco sauce. Premade or packaged marinades. Premade or packaged taco seasonings. Relishes. Regular salad dressings. Where to find more information:  National Heart, Lung, and Blood Institute: www.nhlbi.nih.gov  American Heart Association: www.heart.org Summary  The DASH eating plan is a healthy eating plan that has been shown to reduce high blood pressure (hypertension). It may also reduce your risk for type 2 diabetes, heart disease, and stroke.  With the DASH eating plan, you should limit salt (sodium) intake to 2,300 mg a day. If you have hypertension, you may need to reduce your sodium intake to 1,500 mg a day.  When on the DASH eating plan, aim to eat more fresh fruits and vegetables, whole grains, lean proteins, low-fat dairy, and heart-healthy fats.  Work with your health care provider or diet and nutrition specialist (dietitian) to adjust your eating plan to your individual   calorie needs. This information is not intended to replace advice given to you by your health care provider. Make sure you discuss any questions you have with your health care provider. Document Released: 02/12/2011 Document Revised: 02/17/2016 Document Reviewed: 02/17/2016 Elsevier Interactive Patient Education  2018 Elsevier Inc.  

## 2017-05-12 NOTE — Progress Notes (Signed)
[name] Female    DOB: 1941/02/27   77 y.o.   MRN: [id number] Visit Date: 05/12/2017  Today's Provider: Mar Daring, PA-C   Chief Complaint  Patient presents with  . Hypertension  . Follow-up   Subjective:    URI   This is a new problem. Episode onset: 6 days ago. The problem has been waxing and waning. There has been no fever. Associated symptoms include congestion, headaches, nausea and sinus pain. Pertinent negatives include no ear pain or sore throat. Treatments tried: NyQuil and DyQuil. The treatment provided mild relief.    Hypertension, follow-up:  BP Readings from Last 3 Encounters:  05/12/17 126/70  11/05/16 (!) 94/54  08/27/16 120/62    She was last seen for hypertension 6 months ago.  BP at that visit was 120/62. Management since that visit includes Stable. Continue Valsartan 80mg , Hctz 12.5mg . She reports good compliance with treatment. She is not having side effects.  She is not exercising. She is not adherent to low salt diet.   Outside blood pressures are not being checked. She is experiencing upper respiratory symptoms.  Patient denies chest pain, chest pressure/discomfort, claudication, dyspnea, exertional chest pressure/discomfort, fatigue, irregular heart beat, lower extremity edema, near-syncope, orthopnea, palpitations, paroxysmal nocturnal dyspnea, syncope and tachypnea.   Cardiovascular risk factors include advanced age (older than 61 for men, 46 for women), diabetes mellitus, dyslipidemia and hypertension.  Use of agents associated with hypertension: none.     Weight trend: stable Wt Readings from Last 3 Encounters:  05/12/17 207 lb 6.4 oz (94.1 kg)  11/05/16 185 lb (83.9 kg)  08/27/16 195 lb 3.2 oz (88.5 kg)    Current diet: in general, a "healthy" diet    ------------------------------------------------------------------------     Allergies  Allergen Reactions  . Biaxin [Clarithromycin] Nausea And  Vomiting  . Codeine Nausea And Vomiting  . Lisinopril Cough  . Metformin Nausea Only  . Morphine Nausea And Vomiting  . Ciprofloxacin Itching, Nausea Only and Rash     Current Outpatient Medications:  .  aspirin 81 MG tablet, Take 81 mg by mouth daily. , Disp: , Rfl:  .  Blood Glucose Monitoring Suppl (TRUE METRIX AIR GLUCOSE METER) DEVI, 1 Device by Does not apply route daily. To check blood sugar once a day., Disp: 1 Device, Rfl: 0 .  calcium-vitamin D (OSCAL WITH D) 500-200 MG-UNIT per tablet, Take 1 tablet by mouth 2 (two) times daily. , Disp: , Rfl:  .  Cholecalciferol 1000 UNITS capsule, Take 1,000 Units by mouth daily. , Disp: , Rfl:  .  cyanocobalamin 1000 MCG tablet, Take 1,000 mcg by mouth daily. , Disp: , Rfl:  .  fluticasone (FLONASE) 50 MCG/ACT nasal spray, Place 2 sprays into both nostrils daily., Disp: 48 g, Rfl: 3 .  glipiZIDE (GLUCOTROL) 10 MG tablet, Take 2 tablets by mouth 2 (two) times daily., Disp: , Rfl:  .  glucose blood (TRUE METRIX BLOOD GLUCOSE TEST) test strip, Use to check blood sugar twice daily per endocrine.  DX: E11.9., Disp: 200 each, Rfl: 1 .  hydrochlorothiazide (HYDRODIURIL) 12.5 MG tablet, Take 1 tablet (12.5 mg total) by mouth daily., Disp: 90 tablet, Rfl: 1 .  INVOKANA 300 MG TABS tablet, 300 mg daily before breakfast. , Disp: , Rfl:  .  KLOR-CON M20 20 MEQ tablet, TAKE 1 TABLET (20 MEQ TOTAL) BY MOUTH DAILY., Disp: 90 tablet, Rfl: 1 .  meclizine (ANTIVERT) 25 MG tablet, Take  1 tablet (25 mg total) by mouth 3 (three) times daily as needed for dizziness., Disp: 30 tablet, Rfl: 0 .  simvastatin (ZOCOR) 20 MG tablet, TAKE 1 TABLET AT BEDTIME, Disp: 90 tablet, Rfl: 1 .  timolol (TIMOPTIC) 0.5 % ophthalmic solution, Place 1 drop into both eyes daily. , Disp: , Rfl:  .  TRUEPLUS LANCETS 28G MISC, To check blood sugar once a day., Disp: 100 each, Rfl: 3 .  TRULICITY 1.5 EX/9.3ZJ SOPN, Inject as directed once a week. , Disp: , Rfl:  .  valsartan (DIOVAN) 80 MG  tablet, TAKE 1 TABLET EVERY DAY, Disp: 90 tablet, Rfl: 1  Review of Systems  Constitutional: Negative.   HENT: Positive for congestion, postnasal drip and sinus pain. Negative for ear pain and sore throat.   Respiratory: Negative.   Cardiovascular: Negative.   Gastrointestinal: Positive for nausea.  Neurological: Positive for headaches.    Social History   Tobacco Use  . Smoking status: Former Smoker    Last attempt to quit: 07/17/1984    Years since quitting: 32.8  . Smokeless tobacco: Never Used  Substance Use Topics  . Alcohol use: No   Objective:   BP 126/70 (BP Location: Left Arm, Patient Position: Sitting, Cuff Size: Normal)   Pulse 65   Temp 97.7 F (36.5 C) (Oral)   Wt 207 lb 6.4 oz (94.1 kg)   SpO2 98%   BMI 32.48 kg/m    Physical Exam  Constitutional: She appears well-developed and well-nourished. No distress.  HENT:  Head: Normocephalic and atraumatic.  Right Ear: Hearing, tympanic membrane, external ear and ear canal normal.  Left Ear: Hearing, tympanic membrane, external ear and ear canal normal.  Nose: Nose normal.  Mouth/Throat: Uvula is midline, oropharynx is clear and moist and mucous membranes are normal. No oropharyngeal exudate.  Eyes: Conjunctivae are normal. Pupils are equal, round, and reactive to light. Right eye exhibits no discharge. Left eye exhibits no discharge. No scleral icterus.  Neck: Normal range of motion. Neck supple. No tracheal deviation present. No thyromegaly present.  Cardiovascular: Normal rate, regular rhythm and normal heart sounds. Exam reveals no gallop and no friction rub.  No murmur heard. Pulmonary/Chest: Effort normal and breath sounds normal. No stridor. No respiratory distress. She has no wheezes. She has no rales.  Musculoskeletal: She exhibits no edema.  Lymphadenopathy:    She has no cervical adenopathy.  Skin: Skin is warm and dry. She is not diaphoretic.  Vitals reviewed.       Assessment & Plan:     1.  Essential hypertension Stable. Continue Valsartan 80mg  and HCTZ 12.5mg . I will see her back in 6 months for CPE.   2. Upper respiratory tract infection, unspecified type Worsening symptoms that have not responded to OTC medications. Will give amoxicillin as below. Continue allergy medications. Stay well hydrated and get plenty of rest. Call if no symptom improvement or if symptoms worsen. - amoxicillin (AMOXIL) 875 MG tablet; Take 1 tablet (875 mg total) by mouth 2 (two) times daily.  Dispense: 14 tablet; Refill: 0  3. Eustachian tube dysfunction, bilateral Stable. Diagnosis pulled for medication refill. Continue current medical treatment plan. - fluticasone (FLONASE) 50 MCG/ACT nasal spray; Place 2 sprays into both nostrils daily.  Dispense: 48 g; Refill: Susank, PA-C  [facility]

## 2017-05-26 ENCOUNTER — Other Ambulatory Visit: Payer: Self-pay | Admitting: Physician Assistant

## 2017-07-07 ENCOUNTER — Other Ambulatory Visit: Payer: Self-pay | Admitting: Physician Assistant

## 2017-07-07 DIAGNOSIS — I10 Essential (primary) hypertension: Secondary | ICD-10-CM

## 2017-08-06 IMAGING — CR DG SHOULDER 2+V*R*
1 series · 3 of 3 positions shown · non-contrast
Comparison: None.

CLINICAL DATA: Bilateral shoulder pain

EXAM:
RIGHT SHOULDER - 2+ VIEW

[Series 1: dg shoulder right · 0.14mm/px · 3 of 3 slices shown]
[im 1/3]
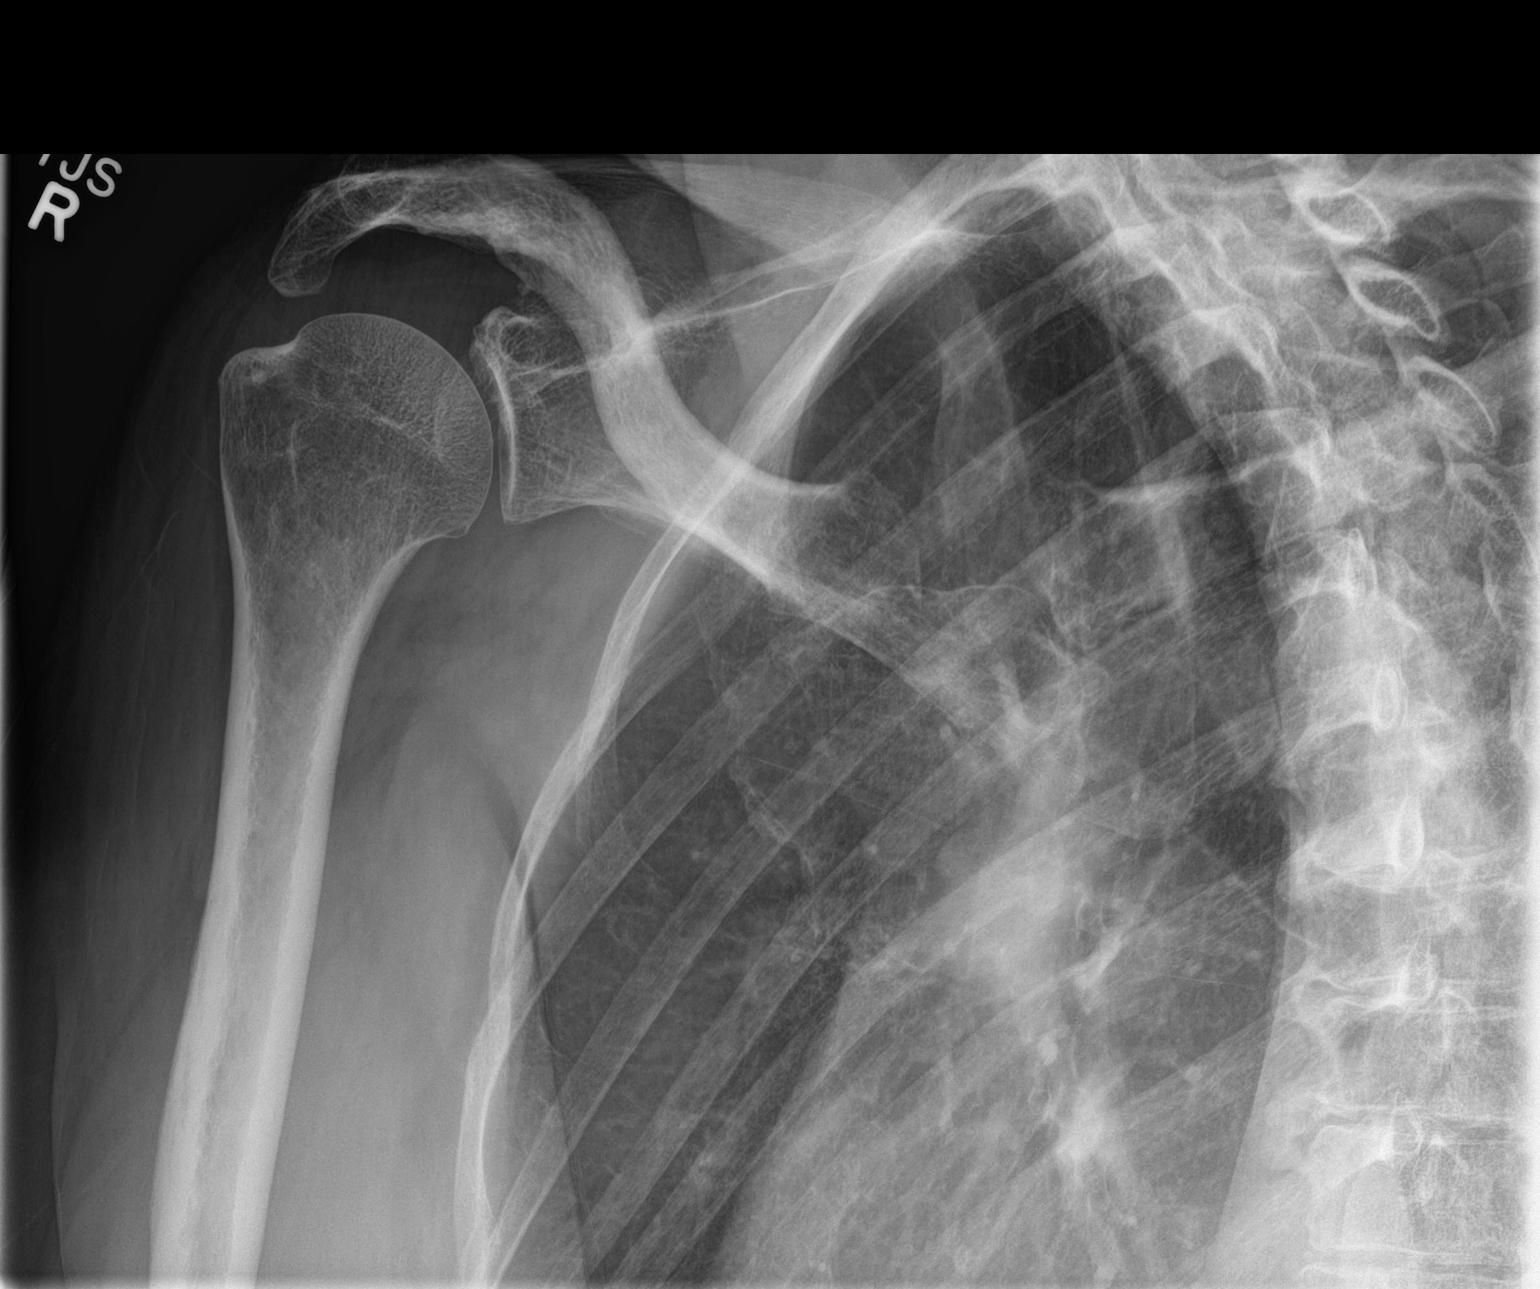
[im 2/3]
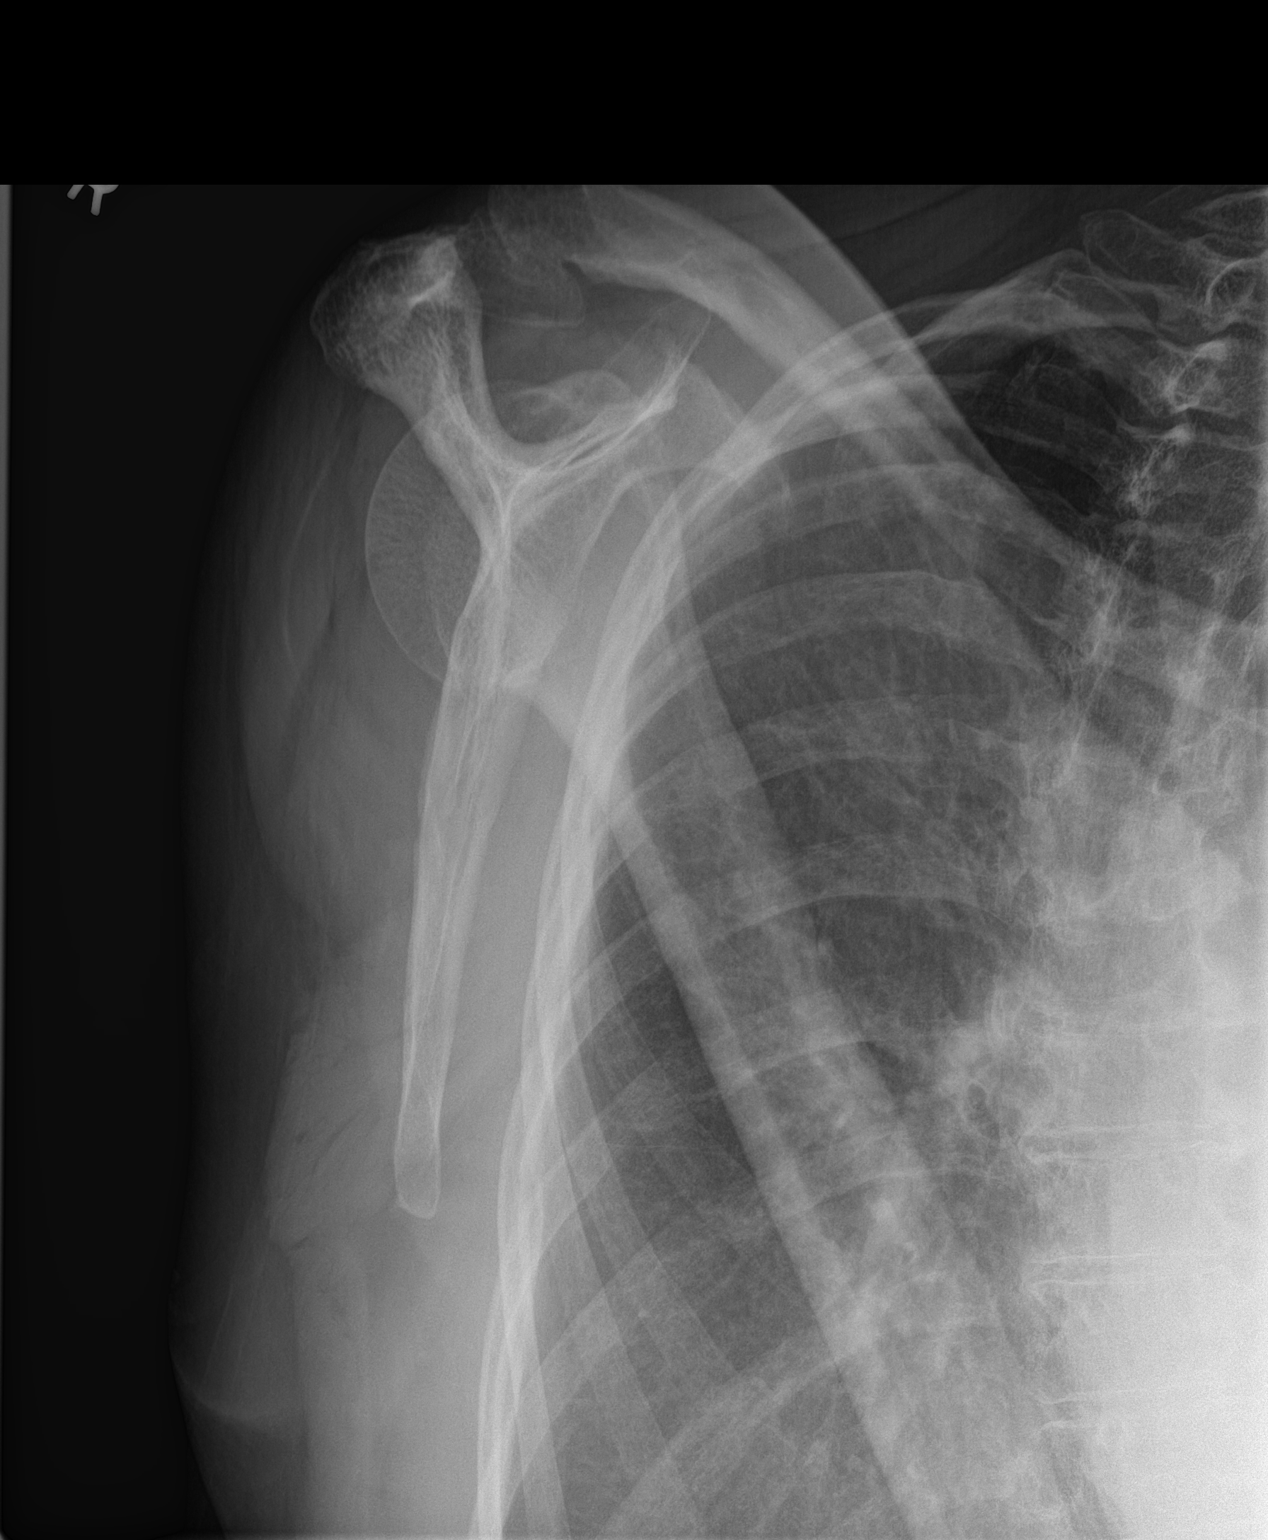
[im 3/3]
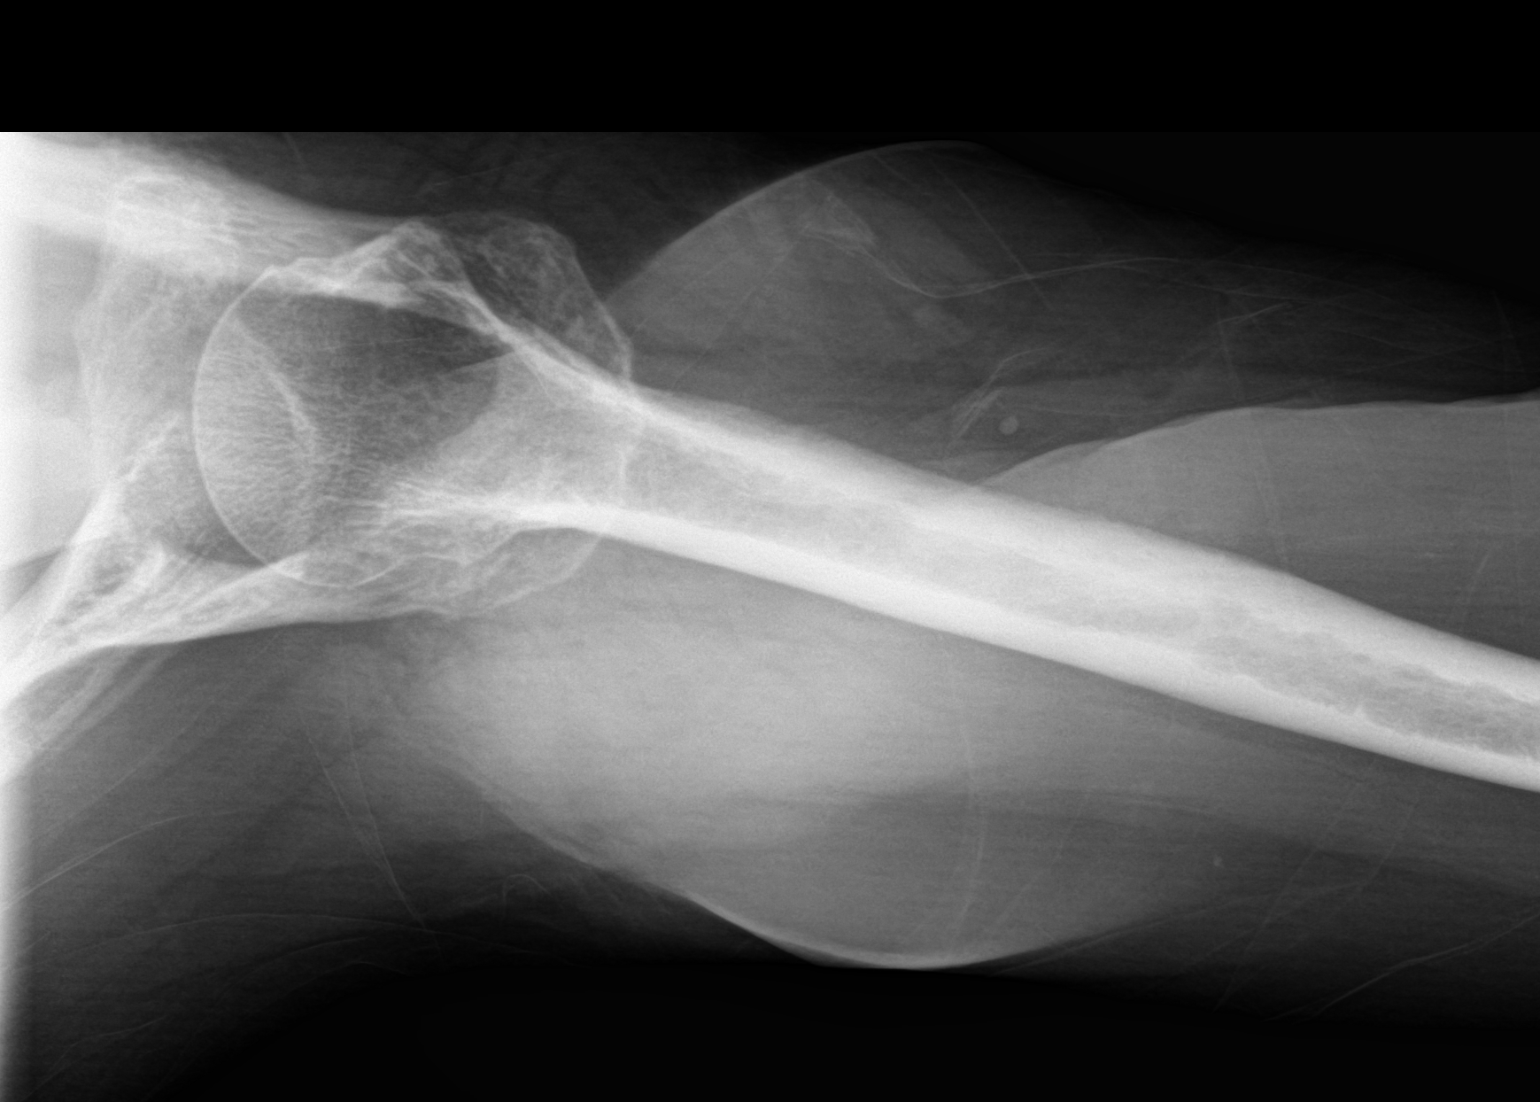

[3 of 3 positions shown; findings below may reference images not displayed]

FINDINGS: There is no evidence of fracture or dislocation. There is no
evidence of arthropathy or other focal bone abnormality. Soft
tissues are unremarkable.
IMPRESSION: Negative.

## 2017-08-14 IMAGING — CR DG CHEST 1V
1 series · 1 of 1 positions shown · non-contrast
Comparison: 10/08/2013

CLINICAL DATA: Positive QuantiFERON TB gold test.

EXAM:
CHEST 1 VIEW

[dg chest 1 view]
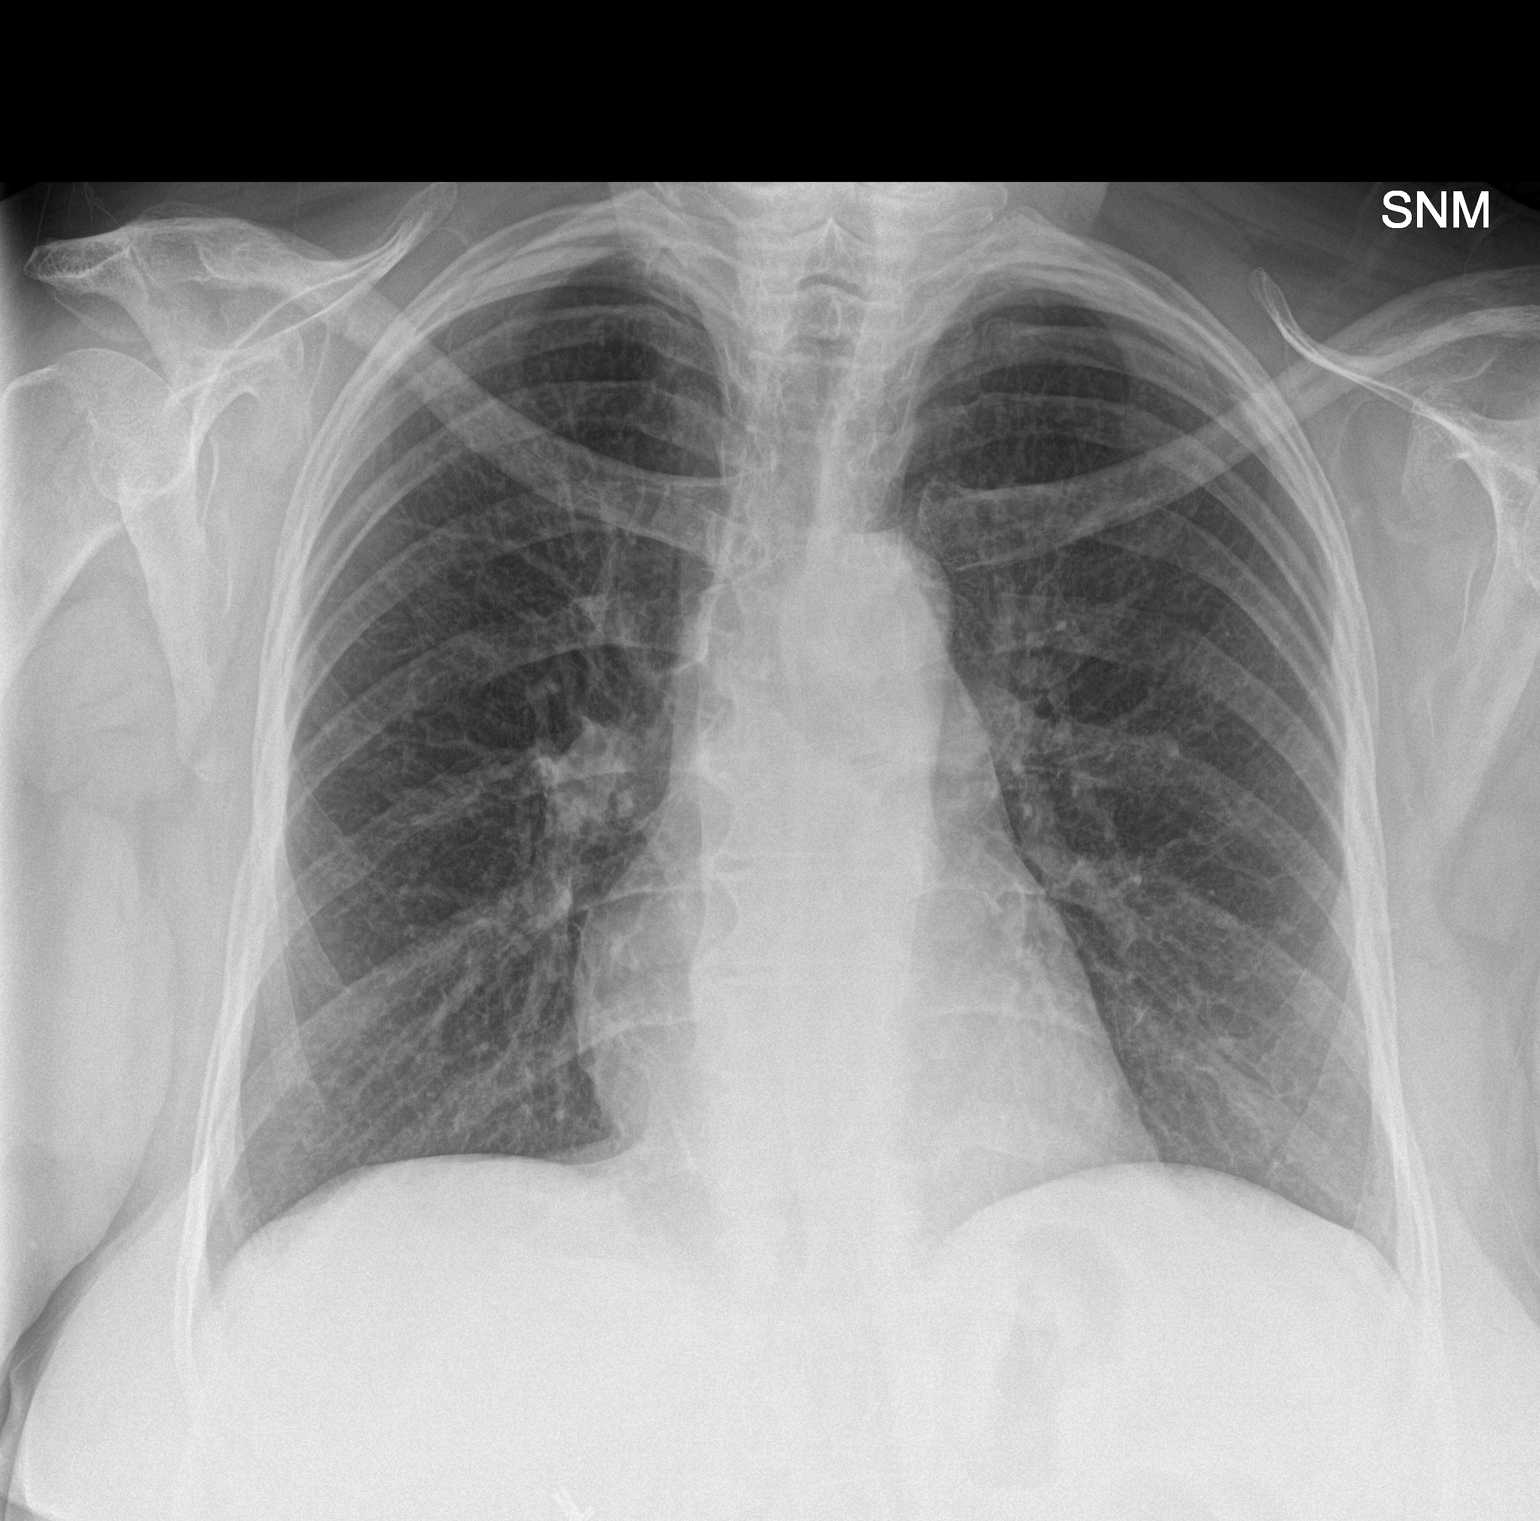

[1 of 1 positions shown; findings below may reference images not displayed]

FINDINGS: The heart size and mediastinal contours are within normal limits.
Both lungs are clear. No mass or lymphadenopathy identified. No
evidence of pleural effusion.
IMPRESSION: Stable exam.  No active disease.

## 2017-08-30 ENCOUNTER — Other Ambulatory Visit: Payer: Self-pay | Admitting: Physician Assistant

## 2017-10-15 ENCOUNTER — Telehealth: Payer: Self-pay

## 2017-10-15 NOTE — Telephone Encounter (Signed)
LMTCB and schedule AWV prior to CPE on 11/24/17. -MM

## 2017-10-27 NOTE — Telephone Encounter (Signed)
Scheduled pt for AWV on 11/22/17 @ 2:40 PM. -MM

## 2017-11-16 ENCOUNTER — Encounter: Payer: Medicare PPO | Admitting: Physician Assistant

## 2017-11-19 ENCOUNTER — Other Ambulatory Visit: Payer: Self-pay | Admitting: Physician Assistant

## 2017-11-22 ENCOUNTER — Ambulatory Visit (INDEPENDENT_AMBULATORY_CARE_PROVIDER_SITE_OTHER): Payer: Medicare PPO

## 2017-11-22 VITALS — BP 122/58 | HR 73 | Temp 98.8°F | Ht 67.0 in | Wt 214.4 lb

## 2017-11-22 DIAGNOSIS — Z23 Encounter for immunization: Secondary | ICD-10-CM

## 2017-11-22 DIAGNOSIS — Z0000 Encounter for general adult medical examination without abnormal findings: Secondary | ICD-10-CM | POA: Diagnosis not present

## 2017-11-22 NOTE — Progress Notes (Signed)
Subjective:   Kathleen Campos is a 77 y.o. female who presents for Medicare Annual (Subsequent) preventive examination.  Review of Systems:  N/A  Cardiac Risk Factors include: advanced age (>45men, >75 women);diabetes mellitus;dyslipidemia;hypertension;obesity (BMI >30kg/m2)     Objective:     Vitals: BP (!) 122/58 (BP Location: Right Arm)   Pulse 73   Temp 98.8 F (37.1 C) (Oral)   Ht 5\' 7"  (1.702 m)   Wt 214 lb 6.4 oz (97.3 kg)   BMI 33.58 kg/m   Body mass index is 33.58 kg/m.  Advanced Directives 11/22/2017 11/05/2016 08/27/2016 01/20/2016 12/25/2015 05/31/2015 05/02/2015  Does Patient Have a Medical Advance Directive? Yes No No No No No No  Type of Advance Directive Living will;Healthcare Power of Attorney - - - - - -  Copy of [place] in Chart? No - copy requested - - - - - -  Would patient like information on creating a medical advance directive? - - (No Data) Yes - Educational materials given - - -    Tobacco Social History   Tobacco Use  Smoking Status Former Smoker  . Last attempt to quit: 07/17/1984  . Years since quitting: 33.3  Smokeless Tobacco Never Used     Counseling given: Not Answered   Clinical Intake:  Pre-visit preparation completed: Yes  Pain : No/denies pain Pain Score: 0-No pain     Nutritional Status: BMI > 30  Obese Nutritional Risks: Nausea/ vomitting/ diarrhea(nausea occasionally, pt to discuss this further with PCP at next apt) Diabetes: Yes(type 2) CBG done?: No Did pt. bring in CBG monitor from home?: No  How often do you need to have someone help you when you read instructions, pamphlets, or other written materials from your doctor or pharmacy?: 1 - Never  Interpreter Needed?: No  Information entered by :: [facility], LPN  Past Medical History:  Diagnosis Date  . Diabetes mellitus without complication ([place])   . GERD (gastroesophageal reflux disease)   . Hyperlipidemia   . Hypertension   . Stroke  [facility]) 2018   TIA   Past Surgical History:  Procedure Laterality Date  . BACK SURGERY  2003  . CERVICAL BIOPSY  W/ LOOP ELECTRODE EXCISION  2010  . CHOLECYSTECTOMY  1986  . COLONOSCOPY WITH PROPOFOL N/A 11/05/2016   Procedure: COLONOSCOPY WITH PROPOFOL;  Surgeon: Jonathon Bellows, MD;  Location: [facility] ENDOSCOPY;  Service: Gastroenterology;  Laterality: N/A;   Family History  Problem Relation Age of Onset  . Hypertension Mother   . Heart disease Mother   . Lung cancer Father   . Heart disease Sister   . Hypertension Sister   . Hypertension Brother   . Diabetes Brother   . Hyperlipidemia Daughter   . Obesity Son   . Heart attack Son   . Hypertension Son   . Hyperlipidemia Son   . Heart disease Son    Social History   Socioeconomic History  . Marital status: Married    Spouse name: Kyung Rudd  . Number of children: 3  . Years of education: 53  . Highest education level: High school graduate  Occupational History  . Occupation: retired  Scientific laboratory technician  . Financial resource strain: Not hard at all  . Food insecurity:    Worry: Never true    Inability: Never true  . Transportation needs:    Medical: No    Non-medical: No  Tobacco Use  . Smoking status: Former Smoker    Last attempt  to quit: 07/17/1984    Years since quitting: 33.3  . Smokeless tobacco: Never Used  Substance and Sexual Activity  . Alcohol use: No  . Drug use: No  . Sexual activity: Not on file  Lifestyle  . Physical activity:    Days per week: Not on file    Minutes per session: Not on file  . Stress: Not at all  Relationships  . Social connections:    Talks on phone: Not on file    Gets together: Not on file    Attends religious service: Not on file    Active member of club or organization: Not on file    Attends meetings of clubs or organizations: Not on file    Relationship status: Not on file  Other Topics Concern  . Not on file  Social History Narrative  . Not on file    Outpatient Encounter  Medications as of 11/22/2017  Medication Sig  . aspirin 81 MG tablet Take 81 mg by mouth daily.   . Blood Glucose Monitoring Suppl (TRUE METRIX AIR GLUCOSE METER) DEVI 1 Device by Does not apply route daily. To check blood sugar once a day.  . calcium-vitamin D (OSCAL WITH D) 500-200 MG-UNIT per tablet Take 1 tablet by mouth 2 (two) times daily.   . Cholecalciferol 1000 UNITS capsule Take 1,000 Units by mouth daily.   . cyanocobalamin 1000 MCG tablet Take 2,000 mcg by mouth daily.   . fluticasone (FLONASE) 50 MCG/ACT nasal spray Place 2 sprays into both nostrils daily.  Marland Kitchen glipiZIDE (GLUCOTROL) 10 MG tablet Take 10 mg by mouth 2 (two) times daily.   Marland Kitchen glucose blood (TRUE METRIX BLOOD GLUCOSE TEST) test strip Use to check blood sugar twice daily per endocrine.  DX: E11.9.  Marland Kitchen hydrochlorothiazide (HYDRODIURIL) 12.5 MG tablet Take 1 tablet (12.5 mg total) by mouth daily.  . INVOKANA 300 MG TABS tablet 300 mg daily before breakfast.   . omeprazole (PRILOSEC) 20 MG capsule Take 20 mg by mouth daily as needed.  . pioglitazone (ACTOS) 30 MG tablet Take 30 mg by mouth daily.   . potassium chloride SA (K-DUR,KLOR-CON) 20 MEQ tablet TAKE 1 TABLET EVERY DAY  . simvastatin (ZOCOR) 20 MG tablet TAKE 1 TABLET AT BEDTIME  . timolol (BETIMOL) 0.5 % ophthalmic solution Place 1 drop into both eyes daily.   . timolol (TIMOPTIC) 0.5 % ophthalmic solution Place 1 drop into both eyes daily.   . TRUEPLUS LANCETS 28G MISC To check blood sugar once a day.  . TRULICITY 1.5 QI/6.9GE SOPN Inject as directed once a week.   . valsartan (DIOVAN) 80 MG tablet TAKE 1 TABLET EVERY DAY  . amoxicillin (AMOXIL) 875 MG tablet Take 1 tablet (875 mg total) by mouth 2 (two) times daily.  . meclizine (ANTIVERT) 25 MG tablet Take 1 tablet (25 mg total) by mouth 3 (three) times daily as needed for dizziness. (Patient not taking: Reported on 11/22/2017)   No facility-administered encounter medications on file as of 11/22/2017.      Activities of Daily Living In your present state of health, do you have any difficulty performing the following activities: 11/22/2017  Hearing? N  Vision? N  Difficulty concentrating or making decisions? N  Walking or climbing stairs? Y  Comment Due to balance issues.   Dressing or bathing? N  Doing errands, shopping? N  Preparing Food and eating ? N  Using the Toilet? N  In the past six months, have you accidently  leaked urine? N  Do you have problems with loss of bowel control? N  Managing your Medications? N  Managing your Finances? N  Housekeeping or managing your Housekeeping? N  Some recent data might be hidden    Patient Care Team: Rubye Beach as PCP - General (Family Medicine) Dingeldein, Remo Lipps, MD as Consulting Physician (Ophthalmology) Mickie Hillier, MD as Referring Physician (Psychiatry) Barnett Applebaum as Consulting Physician Lonia Farber, MD as Consulting Physician (Internal Medicine)    Assessment:   This is a routine wellness examination for [place].  Exercise Activities and Dietary recommendations Current Exercise Habits: The patient does not participate in regular exercise at present, Exercise limited by: Other - see comments(balance issues and no strength)  Goals    . DIET - REDUCE SUGAR INTAKE     Recommend to continue current diet plan of avoiding sweets, junk food and breads to help aid in weight loss and help diabetes.     . Exercise 3x per week (30 min per time)     Recommend increasing exercise. Pt to start walking 3 days a week for 20-30 minutes.        Fall Risk Fall Risk  11/22/2017 08/27/2016 09/19/2014 09/19/2014 09/07/2014  Falls in the past year? No No No No No   Is the patient's home free of loose throw rugs in walkways, pet beds, electrical cords, etc?   yes      Grab bars in the bathroom? no      Handrails on the stairs?   no      Adequate lighting?   yes  Timed Get Up and Go performed: N/A  Depression  Screen PHQ 2/9 Scores 11/22/2017 08/27/2016 08/27/2016 09/19/2014  PHQ - 2 Score 2 0 0 0  PHQ- 9 Score 5 1 - -     Cognitive Function     6CIT Screen 11/22/2017 08/27/2016  What Year? 0 points 0 points  What month? 0 points 0 points  What time? 0 points 0 points  Count back from 20 0 points 0 points  Months in reverse 2 points 0 points  Repeat phrase 4 points 2 points  Total Score 6 2    Immunization History  Administered Date(s) Administered  . Influenza Split 02/11/2011, 12/11/2011  . Influenza, High Dose Seasonal PF 01/24/2014, 02/15/2015, 02/26/2016, 12/30/2016, 11/22/2017  . Influenza,inj,Quad PF,6+ Mos 01/16/2013  . Influenza-Unspecified 01/16/2013  . Pneumococcal Conjugate-13 05/17/2013  . Pneumococcal Polysaccharide-23 12/11/2011  . Zoster 10/10/2007    Qualifies for Shingles Vaccine? Due for Shingles vaccine. Declined my offer to administer today. Education has been provided regarding the importance of this vaccine. Pt has been advised to call her insurance company to determine her out of pocket expense. Advised she may also receive this vaccine at her local pharmacy or Health Dept. Verbalized acceptance and understanding.  Screening Tests Health Maintenance  Topic Date Due  . TETANUS/TDAP  12/30/1960  . OPHTHALMOLOGY EXAM  11/17/2017  . HEMOGLOBIN A1C  04/08/2018  . FOOT EXAM  10/07/2018  . COLONOSCOPY  11/06/2019  . INFLUENZA VACCINE  Completed  . DEXA SCAN  Completed  . PNA vac Low Risk Adult  Completed    Cancer Screenings: Lung: Low Dose CT Chest recommended if Age 2-80 years, 30 pack-year currently smoking OR have quit w/in 15years. Patient does not qualify. Breast:  Up to date on Mammogram? Yes   Up to date of Bone Density/Dexa? Yes Colorectal: Up to date  Additional Screenings:  Hepatitis  C Screening: N/A     Plan:  I have personally reviewed and addressed the Medicare Annual Wellness questionnaire and have noted the following in the patient's  chart:  A. Medical and social history B. Use of alcohol, tobacco or illicit drugs  C. Current medications and supplements D. Functional ability and status E.  Nutritional status F.  Physical activity G. Advance directives H. List of other physicians I.  Hospitalizations, surgeries, and ER visits in previous 12 months J.  [place] such as hearing and vision if needed, cognitive and depression L. Referrals and appointments - none  In addition, I have reviewed and discussed with patient certain preventive protocols, quality metrics, and best practice recommendations. A written personalized care plan for preventive services as well as general preventive health recommendations were provided to patient.  See attached scanned questionnaire for additional information.   Signed,  Fabio Neighbors, LPN Nurse Health Advisor   Nurse Recommendations: Pt declined the tetanus vaccine today. Eye apt is scheduled for 11/29/17.

## 2017-11-22 NOTE — Patient Instructions (Signed)
Kathleen Campos , Thank you for taking time to come for your Medicare Wellness Visit. I appreciate your ongoing commitment to your health goals. Please review the following plan we discussed and let me know if I can assist you in the future.   Screening recommendations/referrals: Colonoscopy: Up to date Mammogram: Up to date Bone Density: Up to date Recommended yearly ophthalmology/optometry visit for glaucoma screening and checkup Recommended yearly dental visit for hygiene and checkup  Vaccinations: Influenza vaccine: Up to date Pneumococcal vaccine: Up to date Tdap vaccine: Pt declines today.  Shingles vaccine: Pt declines today.     Advanced directives: Please bring a copy of your POA (Power of Attorney) and/or Living Will to your next appointment.   Conditions/risks identified: Obesity- recommend to continue current diet plan of avoiding sweets, junk food and breads to help aid in weight loss and help diabetes.   Next appointment: 11/24/17 @ 10 AM with Fenton Malling.    Preventive Care 41 Years and Older, Female Preventive care refers to lifestyle choices and visits with your health care provider that can promote health and wellness. What does preventive care include?  A yearly physical exam. This is also called an annual well check.  Dental exams once or twice a year.  Routine eye exams. Ask your health care provider how often you should have your eyes checked.  Personal lifestyle choices, including:  Daily care of your teeth and gums.  Regular physical activity.  Eating a healthy diet.  Avoiding tobacco and drug use.  Limiting alcohol use.  Practicing safe sex.  Taking low-dose aspirin every day.  Taking vitamin and mineral supplements as recommended by your health care provider. What happens during an annual well check? The services and screenings done by your health care provider during your annual well check will depend on your age, overall health,  lifestyle risk factors, and family history of disease. Counseling  Your health care provider may ask you questions about your:  Alcohol use.  Tobacco use.  Drug use.  Emotional well-being.  Home and relationship well-being.  Sexual activity.  Eating habits.  History of falls.  Memory and ability to understand (cognition).  Work and work Statistician.  Reproductive health. Screening  You may have the following tests or measurements:  Height, weight, and BMI.  Blood pressure.  Lipid and cholesterol levels. These may be checked every 5 years, or more frequently if you are over 22 years old.  Skin check.  Lung cancer screening. You may have this screening every year starting at age 39 if you have a 30-pack-year history of smoking and currently smoke or have quit within the past 15 years.  Fecal occult blood test (FOBT) of the stool. You may have this test every year starting at age 34.  Flexible sigmoidoscopy or colonoscopy. You may have a sigmoidoscopy every 5 years or a colonoscopy every 10 years starting at age 56.  Hepatitis C blood test.  Hepatitis B blood test.  Sexually transmitted disease (STD) testing.  Diabetes screening. This is done by checking your blood sugar (glucose) after you have not eaten for a while (fasting). You may have this done every 1-3 years.  Bone density scan. This is done to screen for osteoporosis. You may have this done starting at age 79.  Mammogram. This may be done every 1-2 years. Talk to your health care provider about how often you should have regular mammograms. Talk with your health care provider about your test results, treatment options,  and if necessary, the need for more tests. Vaccines  Your health care provider may recommend certain vaccines, such as:  Influenza vaccine. This is recommended every year.  Tetanus, diphtheria, and acellular pertussis (Tdap, Td) vaccine. You may need a Td booster every 10 years.  Zoster  vaccine. You may need this after age 46.  Pneumococcal 13-valent conjugate (PCV13) vaccine. One dose is recommended after age 79.  Pneumococcal polysaccharide (PPSV23) vaccine. One dose is recommended after age 4. Talk to your health care provider about which screenings and vaccines you need and how often you need them. This information is not intended to replace advice given to you by your health care provider. Make sure you discuss any questions you have with your health care provider. Document Released: 03/22/2015 Document Revised: 11/13/2015 Document Reviewed: 12/25/2014 Elsevier Interactive Patient Education  2017 [place] Prevention in the Home Falls can cause injuries. They can happen to people of all ages. There are many things you can do to make your home safe and to help prevent falls. What can I do on the outside of my home?  Regularly fix the edges of walkways and driveways and fix any cracks.  Remove anything that might make you trip as you walk through a door, such as a raised step or threshold.  Trim any bushes or trees on the path to your home.  Use bright outdoor lighting.  Clear any walking paths of anything that might make someone trip, such as rocks or tools.  Regularly check to see if handrails are loose or broken. Make sure that both sides of any steps have handrails.  Any raised decks and porches should have guardrails on the edges.  Have any leaves, snow, or ice cleared regularly.  Use sand or salt on walking paths during winter.  Clean up any spills in your garage right away. This includes oil or grease spills. What can I do in the bathroom?  Use night lights.  Install grab bars by the toilet and in the tub and shower. Do not use towel bars as grab bars.  Use non-skid mats or decals in the tub or shower.  If you need to sit down in the shower, use a plastic, non-slip stool.  Keep the floor dry. Clean up any water that spills on the  floor as soon as it happens.  Remove soap buildup in the tub or shower regularly.  Attach bath mats securely with double-sided non-slip rug tape.  Do not have throw rugs and other things on the floor that can make you trip. What can I do in the bedroom?  Use night lights.  Make sure that you have a light by your bed that is easy to reach.  Do not use any sheets or blankets that are too big for your bed. They should not hang down onto the floor.  Have a firm chair that has side arms. You can use this for support while you get dressed.  Do not have throw rugs and other things on the floor that can make you trip. What can I do in the kitchen?  Clean up any spills right away.  Avoid walking on wet floors.  Keep items that you use a lot in easy-to-reach places.  If you need to reach something above you, use a strong step stool that has a grab bar.  Keep electrical cords out of the way.  Do not use floor polish or wax that makes floors slippery. If  you must use wax, use non-skid floor wax.  Do not have throw rugs and other things on the floor that can make you trip. What can I do with my stairs?  Do not leave any items on the stairs.  Make sure that there are handrails on both sides of the stairs and use them. Fix handrails that are broken or loose. Make sure that handrails are as long as the stairways.  Check any carpeting to make sure that it is firmly attached to the stairs. Fix any carpet that is loose or worn.  Avoid having throw rugs at the top or bottom of the stairs. If you do have throw rugs, attach them to the floor with carpet tape.  Make sure that you have a light switch at the top of the stairs and the bottom of the stairs. If you do not have them, ask someone to add them for you. What else can I do to help prevent falls?  Wear shoes that:  Do not have high heels.  Have rubber bottoms.  Are comfortable and fit you well.  Are closed at the toe. Do not wear  sandals.  If you use a stepladder:  Make sure that it is fully opened. Do not climb a closed stepladder.  Make sure that both sides of the stepladder are locked into place.  Ask someone to hold it for you, if possible.  Clearly mark and make sure that you can see:  Any grab bars or handrails.  First and last steps.  Where the edge of each step is.  Use tools that help you move around (mobility aids) if they are needed. These include:  Canes.  Walkers.  Scooters.  Crutches.  Turn on the lights when you go into a dark area. Replace any light bulbs as soon as they burn out.  Set up your furniture so you have a clear path. Avoid moving your furniture around.  If any of your floors are uneven, fix them.  If there are any pets around you, be aware of where they are.  Review your medicines with your doctor. Some medicines can make you feel dizzy. This can increase your chance of falling. Ask your doctor what other things that you can do to help prevent falls. This information is not intended to replace advice given to you by your health care provider. Make sure you discuss any questions you have with your health care provider. Document Released: 12/20/2008 Document Revised: 08/01/2015 Document Reviewed: 03/30/2014 Elsevier Interactive Patient Education  2017 [employer].

## 2017-11-24 ENCOUNTER — Ambulatory Visit
Admission: RE | Admit: 2017-11-24 | Discharge: 2017-11-24 | Disposition: A | Discharge status: Home / Self Care | Payer: Medicare PPO | Source: Ambulatory Visit | Attending: Physician Assistant | Admitting: Physician Assistant

## 2017-11-24 ENCOUNTER — Encounter: Payer: Self-pay | Admitting: Physician Assistant

## 2017-11-24 ENCOUNTER — Ambulatory Visit (INDEPENDENT_AMBULATORY_CARE_PROVIDER_SITE_OTHER): Payer: Medicare PPO | Admitting: Physician Assistant

## 2017-11-24 VITALS — BP 130/70 | HR 69 | Temp 98.1°F | Resp 16 | Ht 67.0 in | Wt 215.2 lb

## 2017-11-24 DIAGNOSIS — Z1231 Encounter for screening mammogram for malignant neoplasm of breast: Secondary | ICD-10-CM

## 2017-11-24 DIAGNOSIS — E7800 Pure hypercholesterolemia, unspecified: Secondary | ICD-10-CM | POA: Diagnosis not present

## 2017-11-24 DIAGNOSIS — E785 Hyperlipidemia, unspecified: Secondary | ICD-10-CM

## 2017-11-24 DIAGNOSIS — Z794 Long term (current) use of insulin: Secondary | ICD-10-CM | POA: Diagnosis not present

## 2017-11-24 DIAGNOSIS — M7989 Other specified soft tissue disorders: Secondary | ICD-10-CM | POA: Diagnosis present

## 2017-11-24 DIAGNOSIS — Z0000 Encounter for general adult medical examination without abnormal findings: Secondary | ICD-10-CM

## 2017-11-24 DIAGNOSIS — S6992XA Unspecified injury of left wrist, hand and finger(s), initial encounter: Secondary | ICD-10-CM

## 2017-11-24 DIAGNOSIS — E119 Type 2 diabetes mellitus without complications: Secondary | ICD-10-CM | POA: Diagnosis not present

## 2017-11-24 DIAGNOSIS — Z1239 Encounter for other screening for malignant neoplasm of breast: Secondary | ICD-10-CM

## 2017-11-24 DIAGNOSIS — E1169 Type 2 diabetes mellitus with other specified complication: Secondary | ICD-10-CM

## 2017-11-24 NOTE — Progress Notes (Signed)
[name], Female    DOB: 1940-07-23, 77 y.o.   MRN: [id number] Visit Date: 11/24/2017  Today's Provider: Mar Daring, PA-C   Chief Complaint  Patient presents with  . Annual Exam   Subjective:     Complete Physical Kathleen Campos is a 77 y.o. female. She feels well. She reports exercising none. She reports she is sleeping well.  AWE:11/22/17 with NHA ----------------------------------------------------------- Patient c/o injury to left index finger 3 months ago. She thinks is broken.  Review of Systems  Constitutional: Positive for fatigue.  HENT: Positive for congestion, dental problem and trouble swallowing.   Eyes: Positive for itching.  Respiratory: Positive for cough and shortness of breath.   Cardiovascular: Negative.   Gastrointestinal: Positive for nausea.  Endocrine: Negative.   Genitourinary: Negative.   Musculoskeletal: Positive for arthralgias and back pain.  Skin: Negative.   Allergic/Immunologic: Negative.   Neurological: Positive for weakness, light-headedness and numbness. Negative for dizziness and headaches.  Hematological: Negative.   Psychiatric/Behavioral: Negative.     Social History   Socioeconomic History  . Marital status: Married    Spouse name: Kyung Rudd  . Number of children: 3  . Years of education: 82  . Highest education level: High school graduate  Occupational History  . Occupation: retired  Scientific laboratory technician  . Financial resource strain: Not hard at all  . Food insecurity:    Worry: Never true    Inability: Never true  . Transportation needs:    Medical: No    Non-medical: No  Tobacco Use  . Smoking status: Former Smoker    Last attempt to quit: 07/17/1984    Years since quitting: 33.3  . Smokeless tobacco: Never Used  Substance and Sexual Activity  . Alcohol use: No  . Drug use: No  . Sexual activity: Not on file  Lifestyle  . Physical activity:    Days per week: Not on file    Minutes  per session: Not on file  . Stress: Not at all  Relationships  . Social connections:    Talks on phone: Not on file    Gets together: Not on file    Attends religious service: Not on file    Active member of club or organization: Not on file    Attends meetings of clubs or organizations: Not on file    Relationship status: Not on file  . Intimate partner violence:    Fear of current or ex partner: Not on file    Emotionally abused: Not on file    Physically abused: Not on file    Forced sexual activity: Not on file  Other Topics Concern  . Not on file  Social History Narrative  . Not on file    Past Medical History:  Diagnosis Date  . Diabetes mellitus without complication ([place])   . GERD (gastroesophageal reflux disease)   . Hyperlipidemia   . Hypertension   . Stroke [facility]) 2018   TIA     Patient Active Problem List   Diagnosis Date Noted  . Hyperlipidemia due to type 2 diabetes mellitus ([place]) 11/25/2016  . Abnormal brain MRI 02/17/2016  . Nausea 05/02/2015  . GERD (gastroesophageal reflux disease) 04/24/2015  . Dizziness 04/24/2015  . Otitis externa 12/21/2014  . Allergic rhinitis 07/18/2014  . Colon polyp 07/18/2014  . Acid reflux 07/18/2014  . Hypercholesteremia 07/18/2014  . Low serum cobalamin 07/18/2014  . Adult BMI 30+ 07/18/2014  .  Avitaminosis D 07/18/2014  . Diabetes mellitus, type 2 ([place]) 09/09/1998  . AG (atrophic gastritis) 03/06/1998  . HTN (hypertension) 06/04/1994    Past Surgical History:  Procedure Laterality Date  . BACK SURGERY  2003  . CERVICAL BIOPSY  W/ LOOP ELECTRODE EXCISION  2010  . CHOLECYSTECTOMY  1986  . COLONOSCOPY WITH PROPOFOL N/A 11/05/2016   Procedure: COLONOSCOPY WITH PROPOFOL;  Surgeon: Jonathon Bellows, MD;  Location: [facility] ENDOSCOPY;  Service: Gastroenterology;  Laterality: N/A;    Her family history includes Diabetes in her brother; Heart attack in her son; Heart disease in her mother, sister, and son; Hyperlipidemia in her  daughter and son; Hypertension in her brother, mother, sister, and son; Lung cancer in her father; Obesity in her son.      Current Outpatient Medications:  .  aspirin 81 MG tablet, Take 81 mg by mouth daily. , Disp: , Rfl:  .  Blood Glucose Monitoring Suppl (TRUE METRIX AIR GLUCOSE METER) DEVI, 1 Device by Does not apply route daily. To check blood sugar once a day., Disp: 1 Device, Rfl: 0 .  calcium-vitamin D (OSCAL WITH D) 500-200 MG-UNIT per tablet, Take 1 tablet by mouth 2 (two) times daily. , Disp: , Rfl:  .  Cholecalciferol 1000 UNITS capsule, Take 1,000 Units by mouth daily. , Disp: , Rfl:  .  cyanocobalamin 1000 MCG tablet, Take 2,000 mcg by mouth daily. , Disp: , Rfl:  .  fluticasone (FLONASE) 50 MCG/ACT nasal spray, Place 2 sprays into both nostrils daily., Disp: 48 g, Rfl: 3 .  glipiZIDE (GLUCOTROL) 10 MG tablet, Take 10 mg by mouth 2 (two) times daily. , Disp: , Rfl:  .  glucose blood (TRUE METRIX BLOOD GLUCOSE TEST) test strip, Use to check blood sugar twice daily per endocrine.  DX: E11.9., Disp: 200 each, Rfl: 1 .  hydrochlorothiazide (HYDRODIURIL) 12.5 MG tablet, Take 1 tablet (12.5 mg total) by mouth daily., Disp: 90 tablet, Rfl: 1 .  INVOKANA 300 MG TABS tablet, 300 mg daily before breakfast. , Disp: , Rfl:  .  omeprazole (PRILOSEC) 20 MG capsule, Take 20 mg by mouth daily as needed., Disp: , Rfl:  .  pioglitazone (ACTOS) 30 MG tablet, Take 30 mg by mouth daily. , Disp: , Rfl:  .  potassium chloride SA (K-DUR,KLOR-CON) 20 MEQ tablet, TAKE 1 TABLET EVERY DAY, Disp: 90 tablet, Rfl: 1 .  simvastatin (ZOCOR) 20 MG tablet, TAKE 1 TABLET AT BEDTIME, Disp: 90 tablet, Rfl: 1 .  timolol (BETIMOL) 0.5 % ophthalmic solution, Place 1 drop into both eyes daily. , Disp: , Rfl:  .  timolol (TIMOPTIC) 0.5 % ophthalmic solution, Place 1 drop into both eyes daily. , Disp: , Rfl:  .  TRUEPLUS LANCETS 28G MISC, To check blood sugar once a day., Disp: 100 each, Rfl: 3 .  TRULICITY 1.5 TK/2.4OX  SOPN, Inject as directed once a week. , Disp: , Rfl:  .  valsartan (DIOVAN) 80 MG tablet, TAKE 1 TABLET EVERY DAY, Disp: 90 tablet, Rfl: 1 .  meclizine (ANTIVERT) 25 MG tablet, Take 1 tablet (25 mg total) by mouth 3 (three) times daily as needed for dizziness. (Patient not taking: Reported on 11/22/2017), Disp: 30 tablet, Rfl: 0  Patient Care Team: Mar Daring, PA-C as PCP - General (Family Medicine) Dingeldein, Remo Lipps, MD as Consulting Physician (Ophthalmology) Mickie Hillier, MD as Referring Physician (Psychiatry) Barnett Applebaum as Consulting Physician Lonia Farber, MD as Consulting Physician (Internal Medicine)  Objective:   Vitals: BP 130/70 (BP Location: Left Wrist, Patient Position: Sitting, Cuff Size: Normal)   Pulse 69   Temp 98.1 F (36.7 C) (Oral)   Resp 16   Ht 5\' 7"  (1.702 m)   Wt 215 lb 3.2 oz (97.6 kg)   BMI 33.71 kg/m   Physical Exam  Constitutional: She is oriented to person, place, and time. She appears well-developed and well-nourished. No distress.  HENT:  Head: Normocephalic and atraumatic.  Right Ear: Hearing, tympanic membrane, external ear and ear canal normal.  Left Ear: Hearing, tympanic membrane, external ear and ear canal normal.  Nose: Nose normal.  Mouth/Throat: Oropharynx is clear and moist. No oropharyngeal exudate.  Eyes: Pupils are equal, round, and reactive to light. Conjunctivae and EOM are normal. Right eye exhibits no discharge. Left eye exhibits no discharge. No scleral icterus.  Neck: Normal range of motion. Neck supple. No JVD present. Carotid bruit is not present. No tracheal deviation present. No thyromegaly present.  Cardiovascular: Normal rate, regular rhythm, normal heart sounds and intact distal pulses. Exam reveals no gallop and no friction rub.  No murmur heard. Pulmonary/Chest: Effort normal and breath sounds normal. No respiratory distress. She has no wheezes. She has no rales. She exhibits no tenderness.    Abdominal: Soft. Bowel sounds are normal. She exhibits no distension and no mass. There is no tenderness. There is no rebound and no guarding.  Musculoskeletal: Normal range of motion. She exhibits no edema or tenderness.  Lymphadenopathy:    She has no cervical adenopathy.  Neurological: She is alert and oriented to person, place, and time.  Skin: Skin is warm and dry. No rash noted. She is not diaphoretic.  Psychiatric: She has a normal mood and affect. Her behavior is normal. Judgment and thought content normal.  Vitals reviewed.   Activities of Daily Living In your present state of health, do you have any difficulty performing the following activities: 11/22/2017  Hearing? N  Vision? N  Difficulty concentrating or making decisions? N  Walking or climbing stairs? Y  Comment Due to balance issues.   Dressing or bathing? N  Doing errands, shopping? N  Preparing Food and eating ? N  Using the Toilet? N  In the past six months, have you accidently leaked urine? N  Do you have problems with loss of bowel control? N  Managing your Medications? N  Managing your Finances? N  Housekeeping or managing your Housekeeping? N  Some recent data might be hidden    Fall Risk Assessment Fall Risk  11/22/2017 08/27/2016 09/19/2014 09/19/2014 09/07/2014  Falls in the past year? No No No No No     Depression Screen PHQ 2/9 Scores 11/22/2017 08/27/2016 08/27/2016 09/19/2014  PHQ - 2 Score 2 0 0 0  PHQ- 9 Score 5 1 - -    6CIT Screen 11/22/2017 08/27/2016  What Year? 0 points 0 points  What month? 0 points 0 points  What time? 0 points 0 points  Count back from 20 0 points 0 points  Months in reverse 2 points 0 points  Repeat phrase 4 points 2 points  Total Score 6 2       Assessment & Plan:    Annual Physical Reviewed patient's Family Medical History Reviewed and updated list of patient's medical providers Assessment of cognitive impairment was done Assessed patient's functional  ability Established a written schedule for health screening [place] Completed and Reviewed  Exercise Activities and Dietary recommendations  Goals    . DIET - REDUCE SUGAR INTAKE     Recommend to continue current diet plan of avoiding sweets, junk food and breads to help aid in weight loss and help diabetes.     . Exercise 3x per week (30 min per time)     Recommend increasing exercise. Pt to start walking 3 days a week for 20-30 minutes.        Immunization History  Administered Date(s) Administered  . Influenza Split 02/11/2011, 12/11/2011  . Influenza, High Dose Seasonal PF 01/24/2014, 02/15/2015, 02/26/2016, 12/30/2016, 11/22/2017  . Influenza,inj,Quad PF,6+ Mos 01/16/2013  . Influenza-Unspecified 01/16/2013  . Pneumococcal Conjugate-13 05/17/2013  . Pneumococcal Polysaccharide-23 12/11/2011  . Zoster 10/10/2007    Health Maintenance  Topic Date Due  . Samul Dada  12/30/1959  . OPHTHALMOLOGY EXAM  11/17/2017  . HEMOGLOBIN A1C  04/08/2018  . FOOT EXAM  10/07/2018  . COLONOSCOPY  11/06/2019  . INFLUENZA VACCINE  Completed  . DEXA SCAN  Completed  . PNA vac Low Risk Adult  Completed     Discussed health benefits of physical activity, and encouraged her to engage in regular exercise appropriate for her age and condition.    1. Annual physical exam Normal physical exam today. Will check labs as below and f/u pending lab results. If labs are stable and WNL she will not need to have these rechecked for one year at her next annual physical exam. She is to call the office in the meantime if she has any acute issue, questions or concerns. - Comprehensive metabolic panel - CBC with Differential/Platelet - TSH  2. Breast cancer screening There is no family history of breast cancer. She does perform regular self breast exams. Mammogram was ordered as below. Information for [facility] clinic was given to patient so she may schedule her mammogram at  her convenience. - MM 3D SCREEN BREAST BILATERAL; Future  3. Type 2 diabetes mellitus without complication, with long-term current use of insulin (HCC) Stable. Microalbumin normal. On Valsartan. Continue Trulicity 1.5mg  weekly, invokana 300mg  daily, actos 30mg  daily, glipizide 10mg  BID. Followed by Endocrinology, Dr. Honor Junes.  - Hemoglobin A1c - POCT UA - Microalbumin  4. Hypercholesteremia Stable. Continue Simvastatin 20mg . Will check labs as below and f/u pending results. - Lipid panel  5. Hyperlipidemia due to type 2 diabetes mellitus ([place]) See above medical treatment plan.  6. Injury of left index finger, initial encounter Injured approx 3 months ago. Kept splinted too long now cannot bend finger. Will get xray as below to r/o bony abnormality or fusing. If xray unremarkable would recommend OT.  - DG Finger Index Left; Future  ------------------------------------------------------------------------------------------------------------    Mar Daring, PA-C  [facility]

## 2017-11-24 NOTE — Patient Instructions (Signed)
Health Maintenance for Postmenopausal Women Menopause is a normal process in which your reproductive ability comes to an end. This process happens gradually over a span of months to years, usually between the ages of 22 and 9. Menopause is complete when you have missed 12 consecutive menstrual periods. It is important to talk with your health care provider about some of the most common conditions that affect postmenopausal women, such as heart disease, cancer, and bone loss (osteoporosis). Adopting a healthy lifestyle and getting preventive care can help to promote your health and wellness. Those actions can also lower your chances of developing some of these common conditions. What should I know about menopause? During menopause, you may experience a number of symptoms, such as:  Moderate-to-severe hot flashes.  Night sweats.  Decrease in sex drive.  Mood swings.  Headaches.  Tiredness.  Irritability.  Memory problems.  Insomnia.  Choosing to treat or not to treat menopausal changes is an individual decision that you make with your health care provider. What should I know about hormone replacement therapy and supplements? Hormone therapy products are effective for treating symptoms that are associated with menopause, such as hot flashes and night sweats. Hormone replacement carries certain risks, especially as you become older. If you are thinking about using estrogen or estrogen with progestin treatments, discuss the benefits and risks with your health care provider. What should I know about heart disease and stroke? Heart disease, heart attack, and stroke become more likely as you age. This may be due, in part, to the hormonal changes that your body experiences during menopause. These can affect how your body processes dietary fats, triglycerides, and cholesterol. Heart attack and stroke are both medical emergencies. There are many things that you can do to help prevent heart disease  and stroke:  Have your blood pressure checked at least every 1-2 years. High blood pressure causes heart disease and increases the risk of stroke.  If you are 53-22 years old, ask your health care provider if you should take aspirin to prevent a heart attack or a stroke.  Do not use any tobacco products, including cigarettes, chewing tobacco, or electronic cigarettes. If you need help quitting, ask your health care provider.  It is important to eat a healthy diet and maintain a healthy weight. ? Be sure to include plenty of vegetables, fruits, low-fat dairy products, and lean protein. ? Avoid eating foods that are high in solid fats, added sugars, or salt (sodium).  Get regular exercise. This is one of the most important things that you can do for your health. ? Try to exercise for at least 150 minutes each week. The type of exercise that you do should increase your heart rate and make you sweat. This is known as moderate-intensity exercise. ? Try to do strengthening exercises at least twice each week. Do these in addition to the moderate-intensity exercise.  Know your numbers.Ask your health care provider to check your cholesterol and your blood glucose. Continue to have your blood tested as directed by your health care provider.  What should I know about cancer screening? There are several types of cancer. Take the following steps to reduce your risk and to catch any cancer development as early as possible. Breast Cancer  Practice breast self-awareness. ? This means understanding how your breasts normally appear and feel. ? It also means doing regular breast self-exams. Let your health care provider know about any changes, no matter how small.  If you are 40  or older, have a clinician do a breast exam (clinical breast exam or CBE) every year. Depending on your age, family history, and medical history, it may be recommended that you also have a yearly breast X-ray (mammogram).  If you  have a family history of breast cancer, talk with your health care provider about genetic screening.  If you are at high risk for breast cancer, talk with your health care provider about having an MRI and a mammogram every year.  Breast cancer (BRCA) gene test is recommended for women who have family members with BRCA-related cancers. Results of the assessment will determine the need for genetic counseling and BRCA1 and for BRCA2 testing. BRCA-related cancers include these types: ? Breast. This occurs in males or females. ? Ovarian. ? Tubal. This may also be called fallopian tube cancer. ? Cancer of the abdominal or pelvic lining (peritoneal cancer). ? Prostate. ? Pancreatic.  Cervical, Uterine, and Ovarian Cancer Your health care provider may recommend that you be screened regularly for cancer of the pelvic organs. These include your ovaries, uterus, and vagina. This screening involves a pelvic exam, which includes checking for microscopic changes to the surface of your cervix (Pap test).  For women ages 21-65, health care providers may recommend a pelvic exam and a Pap test every three years. For women ages 79-65, they may recommend the Pap test and pelvic exam, combined with testing for human papilloma virus (HPV), every five years. Some types of HPV increase your risk of cervical cancer. Testing for HPV may also be done on women of any age who have unclear Pap test results.  Other health care providers may not recommend any screening for nonpregnant women who are considered low risk for pelvic cancer and have no symptoms. Ask your health care provider if a screening pelvic exam is right for you.  If you have had past treatment for cervical cancer or a condition that could lead to cancer, you need Pap tests and screening for cancer for at least 20 years after your treatment. If Pap tests have been discontinued for you, your risk factors (such as having a new sexual partner) need to be  reassessed to determine if you should start having screenings again. Some women have medical problems that increase the chance of getting cervical cancer. In these cases, your health care provider may recommend that you have screening and Pap tests more often.  If you have a family history of uterine cancer or ovarian cancer, talk with your health care provider about genetic screening.  If you have vaginal bleeding after reaching menopause, tell your health care provider.  There are currently no reliable tests available to screen for ovarian cancer.  Lung Cancer Lung cancer screening is recommended for adults 69-62 years old who are at high risk for lung cancer because of a history of smoking. A yearly low-dose CT scan of the lungs is recommended if you:  Currently smoke.  Have a history of at least 30 pack-years of smoking and you currently smoke or have quit within the past 15 years. A pack-year is smoking an average of one pack of cigarettes per day for one year.  Yearly screening should:  Continue until it has been 15 years since you quit.  Stop if you develop a health problem that would prevent you from having lung cancer treatment.  Colorectal Cancer  This type of cancer can be detected and can often be prevented.  Routine colorectal cancer screening usually begins at  age 42 and continues through age 45.  If you have risk factors for colon cancer, your health care provider may recommend that you be screened at an earlier age.  If you have a family history of colorectal cancer, talk with your health care provider about genetic screening.  Your health care provider may also recommend using home test kits to check for hidden blood in your stool.  A small camera at the end of a tube can be used to examine your colon directly (sigmoidoscopy or colonoscopy). This is done to check for the earliest forms of colorectal cancer.  Direct examination of the colon should be repeated every  5-10 years until age 71. However, if early forms of precancerous polyps or small growths are found or if you have a family history or genetic risk for colorectal cancer, you may need to be screened more often.  Skin Cancer  Check your skin from head to toe regularly.  Monitor any moles. Be sure to tell your health care provider: ? About any new moles or changes in moles, especially if there is a change in a mole's shape or color. ? If you have a mole that is larger than the size of a pencil eraser.  If any of your family members has a history of skin cancer, especially at a young age, talk with your health care provider about genetic screening.  Always use sunscreen. Apply sunscreen liberally and repeatedly throughout the day.  Whenever you are outside, protect yourself by wearing long sleeves, pants, a wide-brimmed hat, and sunglasses.  What should I know about osteoporosis? Osteoporosis is a condition in which bone destruction happens more quickly than new bone creation. After menopause, you may be at an increased risk for osteoporosis. To help prevent osteoporosis or the bone fractures that can happen because of osteoporosis, the following is recommended:  If you are 46-71 years old, get at least 1,000 mg of calcium and at least 600 mg of vitamin D per day.  If you are older than age 55 but younger than age 65, get at least 1,200 mg of calcium and at least 600 mg of vitamin D per day.  If you are older than age 54, get at least 1,200 mg of calcium and at least 800 mg of vitamin D per day.  Smoking and excessive alcohol intake increase the risk of osteoporosis. Eat foods that are rich in calcium and vitamin D, and do weight-bearing exercises several times each week as directed by your health care provider. What should I know about how menopause affects my mental health? Depression may occur at any age, but it is more common as you become older. Common symptoms of depression  include:  Low or sad mood.  Changes in sleep patterns.  Changes in appetite or eating patterns.  Feeling an overall lack of motivation or enjoyment of activities that you previously enjoyed.  Frequent crying spells.  Talk with your health care provider if you think that you are experiencing depression. What should I know about immunizations? It is important that you get and maintain your immunizations. These include:  Tetanus, diphtheria, and pertussis (Tdap) booster vaccine.  Influenza every year before the flu season begins.  Pneumonia vaccine.  Shingles vaccine.  Your health care provider may also recommend other immunizations. This information is not intended to replace advice given to you by your health care provider. Make sure you discuss any questions you have with your health care provider. Document Released: 04/17/2005  Document Revised: 09/13/2015 Document Reviewed: 11/27/2014 [employer] Interactive Patient Education  2018 [employer]  

## 2017-11-25 ENCOUNTER — Telehealth: Payer: Self-pay

## 2017-11-25 LAB — COMPREHENSIVE METABOLIC PANEL
ALBUMIN: 4.4 g/dL (ref 3.5–4.8)
ALK PHOS: 59 IU/L (ref 39–117)
ALT: 28 IU/L (ref 0–32)
AST: 35 IU/L (ref 0–40)
Albumin/Globulin Ratio: 1.6 (ref 1.2–2.2)
BUN / CREAT RATIO: 17 (ref 12–28)
BUN: 16 mg/dL (ref 8–27)
Bilirubin Total: 0.4 mg/dL (ref 0.0–1.2)
CALCIUM: 9.7 mg/dL (ref 8.7–10.3)
CO2: 22 mmol/L (ref 20–29)
Chloride: 99 mmol/L (ref 96–106)
Creatinine, Ser: 0.95 mg/dL (ref 0.57–1.00)
GFR calc non Af Amer: 59 mL/min/{1.73_m2} — ABNORMAL LOW (ref >59)
GFR, EST AFRICAN AMERICAN: 68 mL/min/{1.73_m2} (ref >59)
GLOBULIN, TOTAL: 2.8 g/dL (ref 1.5–4.5)
GLUCOSE: 145 mg/dL — AB (ref 65–99)
Potassium: 4.2 mmol/L (ref 3.5–5.2)
Sodium: 140 mmol/L (ref 134–144)
TOTAL PROTEIN: 7.2 g/dL (ref 6.0–8.5)

## 2017-11-25 LAB — CBC WITH DIFFERENTIAL/PLATELET
Basophils Absolute: 0 10*3/uL (ref 0.0–0.2)
Basos: 1 %
EOS (ABSOLUTE): 0.1 10*3/uL (ref 0.0–0.4)
EOS: 3 %
HEMATOCRIT: 45.9 % (ref 34.0–46.6)
HEMOGLOBIN: 15 g/dL (ref 11.1–15.9)
Immature Grans (Abs): 0 10*3/uL (ref 0.0–0.1)
Immature Granulocytes: 1 %
LYMPHS ABS: 1.4 10*3/uL (ref 0.7–3.1)
Lymphs: 28 %
MCH: 29.9 pg (ref 26.6–33.0)
MCHC: 32.7 g/dL (ref 31.5–35.7)
MCV: 92 fL (ref 79–97)
MONOCYTES: 13 %
Monocytes Absolute: 0.6 10*3/uL (ref 0.1–0.9)
Neutrophils Absolute: 2.7 10*3/uL (ref 1.4–7.0)
Neutrophils: 54 %
PLATELETS: 156 10*3/uL (ref 150–450)
RBC: 5.01 x10E6/uL (ref 3.77–5.28)
RDW: 13.4 % (ref 12.3–15.4)
WBC: 4.9 10*3/uL (ref 3.4–10.8)

## 2017-11-25 LAB — LIPID PANEL
CHOL/HDL RATIO: 3 ratio (ref 0.0–4.4)
CHOLESTEROL TOTAL: 195 mg/dL (ref 100–199)
HDL: 64 mg/dL (ref >39)
LDL CALC: 107 mg/dL — AB (ref 0–99)
TRIGLYCERIDES: 122 mg/dL (ref 0–149)
VLDL Cholesterol Cal: 24 mg/dL (ref 5–40)

## 2017-11-25 LAB — TSH: TSH: 1.96 u[IU]/mL (ref 0.450–4.500)

## 2017-11-25 LAB — HEMOGLOBIN A1C
Est. average glucose Bld gHb Est-mCnc: 177 mg/dL
Hgb A1c MFr Bld: 7.8 % — ABNORMAL HIGH (ref 4.8–5.6)

## 2017-11-25 NOTE — Telephone Encounter (Signed)
Patient advised as below. Patient reports she will call later on about OT.

## 2017-11-25 NOTE — Telephone Encounter (Signed)
-----   Message from Mar Daring, [place] sent at 11/24/2017  5:08 PM EDT ----- No fracture noted on xray. I would recommend occupation therapy for the finger as it may be contracted in extension due to prolonged splinting.

## 2017-11-25 NOTE — Telephone Encounter (Signed)
-----   Message from Mar Daring, [place] sent at 11/25/2017 11:19 AM EDT ----- Kidney and liver function normal. Blood count normal. A1c is down to 7.8 from 8.9. Continue medications as prescribed and lifestyle modifications. Cholesterol stable. Thyroid normal.

## 2017-11-30 ENCOUNTER — Other Ambulatory Visit: Payer: Self-pay | Admitting: Physician Assistant

## 2017-11-30 DIAGNOSIS — I10 Essential (primary) hypertension: Secondary | ICD-10-CM

## 2017-11-30 LAB — POCT UA - MICROALBUMIN: MICROALBUMIN (UR) POC: 20 mg/L

## 2018-02-14 ENCOUNTER — Encounter: Payer: Self-pay | Admitting: Obstetrics and Gynecology

## 2018-02-14 ENCOUNTER — Ambulatory Visit (INDEPENDENT_AMBULATORY_CARE_PROVIDER_SITE_OTHER): Payer: Medicare PPO | Admitting: Obstetrics and Gynecology

## 2018-02-14 VITALS — BP 140/70 | HR 96 | Ht 67.0 in | Wt 214.0 lb

## 2018-02-14 DIAGNOSIS — R300 Dysuria: Secondary | ICD-10-CM

## 2018-02-14 DIAGNOSIS — R3121 Asymptomatic microscopic hematuria: Secondary | ICD-10-CM

## 2018-02-14 DIAGNOSIS — N760 Acute vaginitis: Secondary | ICD-10-CM

## 2018-02-14 LAB — POCT WET PREP WITH KOH
CLUE CELLS WET PREP PER HPF POC: NEGATIVE
KOH PREP POC: NEGATIVE
Trichomonas, UA: NEGATIVE
Yeast Wet Prep HPF POC: NEGATIVE

## 2018-02-14 LAB — POCT URINALYSIS DIPSTICK
BILIRUBIN UA: NEGATIVE
GLUCOSE UA: POSITIVE — AB
KETONES UA: NEGATIVE
Nitrite, UA: NEGATIVE
Protein, UA: POSITIVE — AB
SPEC GRAV UA: 1.01 (ref 1.010–1.025)
pH, UA: 6 (ref 5.0–8.0)

## 2018-02-14 MED ORDER — FLUCONAZOLE 150 MG PO TABS: 150.0000 mg | ORAL_TABLET | Freq: Once | ORAL | 0 refills | Status: AC

## 2018-02-14 NOTE — Progress Notes (Signed)
Mar Daring, PA-C   Chief Complaint  Patient presents with  . Vaginitis    discharge, itchiness, no odor, had pelvic pain x one week, also little burning when urinates, no frequency    HPI:      [name] is a 77 y.o. G3P3 who LMP was No LMP recorded. Patient is postmenopausal., presents today for increased vaginal d/c, irritation, no odor for the past wk. Did notice small amt of blood with wiping. Pt has had dysuria but no other urin sx. She has treated with vagisil itch cream and AZO yeast. No recent abx use, DM with good control. Has occas stabbing pelvic pains and occas constipation. She is sex active, no vaginal dryness.   Past Medical History:  Diagnosis Date  . Diabetes mellitus without complication ([place])   . GERD (gastroesophageal reflux disease)   . Hyperlipidemia   . Hypertension   . Stroke [facility]) 2018   TIA    Past Surgical History:  Procedure Laterality Date  . BACK SURGERY  2003  . CERVICAL BIOPSY  W/ LOOP ELECTRODE EXCISION  2010  . CHOLECYSTECTOMY  1986  . COLONOSCOPY WITH PROPOFOL N/A 11/05/2016   Procedure: COLONOSCOPY WITH PROPOFOL;  Surgeon: Jonathon Bellows, MD;  Location: [facility] ENDOSCOPY;  Service: Gastroenterology;  Laterality: N/A;    Family History  Problem Relation Age of Onset  . Hypertension Mother   . Heart disease Mother   . Lung cancer Father   . Heart disease Sister   . Hypertension Sister   . Hypertension Brother   . Diabetes Brother   . Hyperlipidemia Daughter   . Obesity Son   . Heart attack Son   . Hypertension Son   . Hyperlipidemia Son   . Heart disease Son     Social History   Socioeconomic History  . Marital status: Married    Spouse name: Kyung Rudd  . Number of children: 3  . Years of education: 65  . Highest education level: High school graduate  Occupational History  . Occupation: retired  Scientific laboratory technician  . Financial resource strain: Not hard at all  . Food insecurity:    Worry: Never true   Inability: Never true  . Transportation needs:    Medical: No    Non-medical: No  Tobacco Use  . Smoking status: Former Smoker    Last attempt to quit: 07/17/1984    Years since quitting: 33.6  . Smokeless tobacco: Never Used  Substance and Sexual Activity  . Alcohol use: No  . Drug use: No  . Sexual activity: Not on file  Lifestyle  . Physical activity:    Days per week: Not on file    Minutes per session: Not on file  . Stress: Not at all  Relationships  . Social connections:    Talks on phone: Not on file    Gets together: Not on file    Attends religious service: Not on file    Active member of club or organization: Not on file    Attends meetings of clubs or organizations: Not on file    Relationship status: Not on file  . Intimate partner violence:    Fear of current or ex partner: Not on file    Emotionally abused: Not on file    Physically abused: Not on file    Forced sexual activity: Not on file  Other Topics Concern  . Not on file  Social History Narrative  . Not on  file    Outpatient Medications Prior to Visit  Medication Sig Dispense Refill  . aspirin 81 MG tablet Take 81 mg by mouth daily.     . Blood Glucose Monitoring Suppl (TRUE METRIX AIR GLUCOSE METER) DEVI 1 Device by Does not apply route daily. To check blood sugar once a day. 1 Device 0  . calcium-vitamin D (OSCAL WITH D) 500-200 MG-UNIT per tablet Take 1 tablet by mouth 2 (two) times daily.     . Cholecalciferol 1000 UNITS capsule Take 1,000 Units by mouth daily.     . cyanocobalamin 1000 MCG tablet Take 2,000 mcg by mouth daily.     . diclofenac sodium (VOLTAREN) 1 % GEL Apply topically.    . fluticasone (FLONASE) 50 MCG/ACT nasal spray Place 2 sprays into both nostrils daily. 48 g 3  . glipiZIDE (GLUCOTROL) 10 MG tablet Take 10 mg by mouth 2 (two) times daily.     Marland Kitchen glucose blood (TRUE METRIX BLOOD GLUCOSE TEST) test strip Use to check blood sugar twice daily per endocrine.  DX: E11.9. 200 each 1   . hydrochlorothiazide (HYDRODIURIL) 12.5 MG tablet Take 1 tablet (12.5 mg total) by mouth daily. 90 tablet 1  . INVOKANA 300 MG TABS tablet 300 mg daily before breakfast.     . omeprazole (PRILOSEC) 20 MG capsule Take 20 mg by mouth daily as needed.    . pioglitazone (ACTOS) 30 MG tablet Take 30 mg by mouth daily.     . potassium chloride SA (K-DUR,KLOR-CON) 20 MEQ tablet TAKE 1 TABLET EVERY DAY 90 tablet 1  . simvastatin (ZOCOR) 20 MG tablet TAKE 1 TABLET AT BEDTIME 90 tablet 1  . timolol (BETIMOL) 0.5 % ophthalmic solution Place 1 drop into both eyes daily.     . timolol (TIMOPTIC) 0.5 % ophthalmic solution Place 1 drop into both eyes daily.     . TRUEPLUS LANCETS 28G MISC To check blood sugar once a day. 100 each 3  . TRULICITY 1.5 KP/5.3ZS SOPN Inject as directed once a week.     . valsartan (DIOVAN) 80 MG tablet TAKE 1 TABLET EVERY DAY 90 tablet 1  . meclizine (ANTIVERT) 25 MG tablet Take 1 tablet (25 mg total) by mouth 3 (three) times daily as needed for dizziness. (Patient not taking: Reported on 11/22/2017) 30 tablet 0   No facility-administered medications prior to visit.       ROS:  Review of Systems  Constitutional: Negative for fever.  Gastrointestinal: Negative for blood in stool, constipation, diarrhea, nausea and vomiting.  Genitourinary: Positive for vaginal discharge. Negative for dyspareunia, dysuria, flank pain, frequency, hematuria, urgency, vaginal bleeding and vaginal pain.  Musculoskeletal: Negative for back pain.  Skin: Negative for rash.    OBJECTIVE:   Vitals:  BP 140/70   Pulse 96   Ht 5\' 7"  (1.702 m)   Wt 214 lb (97.1 kg)   BMI 33.52 kg/m   Physical Exam  Constitutional: She is oriented to person, place, and time. Vital signs are normal. She appears well-developed.  Pulmonary/Chest: Effort normal.  Genitourinary: Uterus normal. There is no rash, tenderness or lesion on the right labia. There is no rash, tenderness or lesion on the left labia.  Uterus is not enlarged and not tender. Cervix exhibits no motion tenderness. Right adnexum displays no mass and no tenderness. Left adnexum displays no mass and no tenderness. No erythema or tenderness in the vagina. Vaginal discharge found.  Musculoskeletal: Normal range of motion.  Neurological: She  is alert and oriented to person, place, and time.  Psychiatric: She has a normal mood and affect. Her behavior is normal. Thought content normal.  Vitals reviewed.   Results: Results for orders placed or performed in visit on 02/14/18 (from the past 24 hour(s))  POCT Urinalysis Dipstick     Status: Abnormal   Collection Time: 02/14/18 11:11 AM  Result Value Ref Range   Color, UA yellow    Clarity, UA clear    Glucose, UA Positive (A) Negative   Bilirubin, UA neg    Ketones, UA neg    Spec Grav, UA 1.010 1.010 - 1.025   Blood, UA small    pH, UA 6.0 5.0 - 8.0   Protein, UA Positive (A) Negative   Urobilinogen, UA     Nitrite, UA neg    Leukocytes, UA Moderate (2+) (A) Negative   Appearance     Odor    POCT Wet Prep with KOH     Status: Normal   Collection Time: 02/14/18 11:18 AM  Result Value Ref Range   Trichomonas, UA Negative    Clue Cells Wet Prep HPF POC neg    Epithelial Wet Prep HPF POC     Yeast Wet Prep HPF POC neg    Bacteria Wet Prep HPF POC     RBC Wet Prep HPF POC     WBC Wet Prep HPF POC     KOH Prep POC Negative Negative     Assessment/Plan: Acute vaginitis - Neg wet prep/exam. Treat empirically for yeast with diflucan. F/u prn.  - Plan: POCT Wet Prep with KOH, fluconazole (DIFLUCAN) 150 MG tablet  Dysuria - Pos UA, check C&S. Will call with results. If neg, rechk UA in 2 wks due to hematuria and sx most likely related to vaginitis. - Plan: POCT Urinalysis Dipstick, Urine Culture  Asymptomatic microscopic hematuria    Meds ordered this encounter  Medications  . fluconazole (DIFLUCAN) 150 MG tablet    Sig: Take 1 tablet (150 mg total) by mouth once for 1  dose.    Dispense:  1 tablet    Refill:  0    Order Specific Question:   Supervising Provider    Answer:   Gae Dry [[id number]]      Return if symptoms worsen or fail to improve.  Alicia B. Copland, PA-C 02/15/2018 9:17 AM

## 2018-02-14 NOTE — Patient Instructions (Signed)
I value your feedback and entrusting us with your care. If you get a [facility] patient survey, I would appreciate you taking the time to let us know about your experience today. Thank you! 

## 2018-02-15 ENCOUNTER — Encounter: Payer: Self-pay | Admitting: Obstetrics and Gynecology

## 2018-02-16 ENCOUNTER — Telehealth: Payer: Self-pay | Admitting: Obstetrics and Gynecology

## 2018-02-16 LAB — URINE CULTURE: ORGANISM ID, BACTERIA: NO GROWTH

## 2018-02-16 NOTE — Telephone Encounter (Signed)
Pt aware of neg C&S. Dysuria and vag itching improved with diflucan. Still has some d/c.  Had small blood on UA and had small blood with wiping with vag sx. RTO in 1-2 wks for UA with RN to look for blood.

## 2018-03-10 ENCOUNTER — Encounter: Payer: Self-pay | Admitting: Occupational Therapy

## 2018-03-10 ENCOUNTER — Other Ambulatory Visit: Payer: Self-pay

## 2018-03-10 ENCOUNTER — Ambulatory Visit: Payer: Medicare PPO | Attending: Sports Medicine | Admitting: Occupational Therapy

## 2018-03-10 DIAGNOSIS — M6281 Muscle weakness (generalized): Secondary | ICD-10-CM | POA: Diagnosis present

## 2018-03-10 DIAGNOSIS — M79645 Pain in left finger(s): Secondary | ICD-10-CM | POA: Insufficient documentation

## 2018-03-10 DIAGNOSIS — R600 Localized edema: Secondary | ICD-10-CM | POA: Insufficient documentation

## 2018-03-10 DIAGNOSIS — M25642 Stiffness of left hand, not elsewhere classified: Secondary | ICD-10-CM | POA: Insufficient documentation

## 2018-03-10 NOTE — Therapy (Signed)
[facility] [address], [place], [address] Phone: [phone]   Fax:  [phone]  Occupational Therapy Evaluation  Patient Details  Name: Kathleen Campos MRN: [id number] Date of Birth: 11-23-1940 Referring Provider (OT): Candelaria Stagers   Encounter Date: 03/10/2018  OT End of Session - 03/10/18 1737    Visit Number  1    Number of Visits  8    Date for OT Re-Evaluation  04/07/18    OT Start Time  1133    OT Stop Time  1222    OT Time Calculation (min)  49 min    Activity Tolerance  Patient tolerated treatment well    Behavior During Therapy  [facility] for tasks assessed/performed       Past Medical History:  Diagnosis Date  . Diabetes mellitus without complication ([place])   . GERD (gastroesophageal reflux disease)   . Hyperlipidemia   . Hypertension   . Stroke [facility]) 2018   TIA    Past Surgical History:  Procedure Laterality Date  . BACK SURGERY  2003  . CERVICAL BIOPSY  W/ LOOP ELECTRODE EXCISION  2010  . CHOLECYSTECTOMY  1986  . COLONOSCOPY WITH PROPOFOL N/A 11/05/2016   Procedure: COLONOSCOPY WITH PROPOFOL;  Surgeon: Jonathon Bellows, MD;  Location: [facility] ENDOSCOPY;  Service: Gastroenterology;  Laterality: N/A;    There were no vitals filed for this visit.  Subjective Assessment - 03/10/18 1729    Subjective   I jammed by finger in July - and then got myself splint - seen my MD and then Dr Candelaria Stagers 2 x  - but feels like my finger is getting worse, pain, swelling and motion - I am using that finger     Patient Stated Goals  I would like the pain , motion and use better in my finger and not stick it up in the air     Currently in Pain?  Yes    Pain Score  8     Pain Location  Finger (Comment which one)    Pain Orientation  Left    Pain Descriptors / Indicators  Aching;Sore;Tightness;Tender    Pain Type  Acute pain;Chronic pain    Pain Onset  More than a month ago    Pain Frequency  Intermittent   with use  and ROM        [facility] OT Assessment - 03/10/18 0001      Assessment   Medical Diagnosis  L 2nd digit sprain    Referring Provider (OT)  Candelaria Stagers    Onset Date/Surgical Date  09/06/17    Hand Dominance  Left      Prior Function   Vocation  Retired    Leisure  house work, Recruitment consultant, out with friends      Edema   Edema  .5 m increase at proximal phalanges and PIP of 2nd       Strength   Right Hand 3 Point Pinch  16 lbs    Left Hand 3 Point Pinch  7 lbs      Left Hand AROM   L Index  MCP 0-90  60 Degrees    L Index PIP 0-100  38 Degrees    L Index DIP 0-70  42 Degrees       fluidotherapy done prior to review of HEP  Provided compression for night time to digit  HEP review   Contrast Massage PIP and DIP  PROM of DIP and  PIP 2nd digit AROM blocked DIP and PIP 2nd digit  10 reps  each tendonglides   10 reps  AROM  Ice massage of A1pulley of 2nd digit  Several times during day  And use 2nd digit in light act                OT Education - 03/10/18 1736    Education Details  findings of eval and HEP    Person(s) Educated  Patient    Methods  Explanation;Demonstration;Handout    Comprehension  Verbalized understanding;Returned demonstration       OT Short Term Goals - 03/10/18 1743      OT SHORT TERM GOAL #1   Title  Pain on PRWHE improve with more than 20 points     Baseline  PRWHE at eval for pain 39/50    Time  3    Period  Weeks    Status  New    Target Date  03/31/18      OT SHORT TERM GOAL #2   Title  Pt to be ind in HEP to decrease pain , increase ROM  an to show increase use of 2nd digit in L hand     Baseline  pain 8/10- and ROM MC 60, PIP 38 and DIP 45     Time  2    Period  Weeks    Status  New    Target Date  03/24/18        OT Long Term Goals - 03/10/18 1752      OT LONG TERM GOAL #1   Title  Pt L 2nd digit AROM improve for pt to touch palm an able to grasp 2cm cylinder object without increase pain     Baseline  see flowsheet for  ROM     Time  4    Period  Weeks    Status  New    Target Date  04/07/18      OT LONG TERM GOAL #2   Title  Pain in 2nd digit flexion improve to less than 2/10 with making fist    Baseline  pain 8/10 with use     Time  4    Period  Weeks    Status  New    Target Date  04/07/18      OT LONG TERM GOAL #3   Title  Function score on PRWHE improve with more than 15 points     Baseline  function score on PRWHE at eval 20/50    Time  4    Period  Weeks    Status  New    Target Date  04/07/18            Plan - 03/10/18 1738    Clinical Impression Statement  Pt present at OT eval about 6 months out from L 2nd digit injury - sprain - pt was immobilize in past and then buddy strap - pt present with increase edema , pain and decrease AROM and PROM in  L 2nd digit - pt is not using 2nd digit and keep it extended - pt also tender at PIP of 2nd digit and pain over A1pulley of 2nd digit and edema  - discuss with pt about working on decrease edema , pain  with compression and contrast -increase AROM - will recommend using ionto over A1pulley to decrease pain - pt report she had some clicking in finger initially - but not  reasonly - pt  can benefit from OT services to improve use of L 2nd digit in ADL's and IADl's     Occupational performance deficits (Please refer to evaluation for details):  ADL's;IADL's;Play;Leisure    Current Impairments/barriers affecting progress:  injury was 6 months ago - tender over A1pulley of 2nd - but no triggering     OT Frequency  2x / week    OT Duration  4 weeks    OT Treatment/Interventions  Self-care/ADL training;Iontophoresis;Therapeutic exercise;Patient/family education;Splinting;Paraffin;Fluidtherapy;Contrast Bath;Ultrasound;Manual Therapy;Passive range of motion    Plan  assess progress with HEP     Clinical Decision Making  Several treatment options, min-mod task modification necessary    OT Home Exercise Plan  see pt instruction    Consulted and Agree  with Plan of Care  Patient       Patient will benefit from skilled therapeutic intervention in order to improve the following deficits and impairments:  Pain, Impaired flexibility, Increased edema, Decreased strength, Decreased range of motion, Impaired UE functional use  Visit Diagnosis: Pain in left finger(s) - Plan: Ot plan of care cert/re-cert  Localized edema - Plan: Ot plan of care cert/re-cert  Stiffness of left hand, not elsewhere classified - Plan: Ot plan of care cert/re-cert  Muscle weakness (generalized) - Plan: Ot plan of care cert/re-cert    Problem List Patient Active Problem List   Diagnosis Date Noted  . Hyperlipidemia due to type 2 diabetes mellitus ([place]) 11/25/2016  . Abnormal brain MRI 02/17/2016  . Nausea 05/02/2015  . GERD (gastroesophageal reflux disease) 04/24/2015  . Dizziness 04/24/2015  . Otitis externa 12/21/2014  . Allergic rhinitis 07/18/2014  . Colon polyp 07/18/2014  . Acid reflux 07/18/2014  . Hypercholesteremia 07/18/2014  . Low serum cobalamin 07/18/2014  . Adult BMI 30+ 07/18/2014  . Avitaminosis D 07/18/2014  . Diabetes mellitus, type 2 ([place]) 09/09/1998  . AG (atrophic gastritis) 03/06/1998  . HTN (hypertension) 06/04/1994    Rosalyn Gess OTR/L,CLT 03/10/2018, 5:56 PM  [facility] [address], [place], [address] Phone: [phone]   Fax:  [phone]  Name: KIERYN BURTIS MRN: [id number] Date of Birth: 1940/08/20

## 2018-03-10 NOTE — Patient Instructions (Signed)
Contrast Massage PIP and DIP  PROM of DIP and PIP 2nd digit AROM blocked DIP and PIP 2nd digit  10 reps  each tendonglides   10 reps  AROM  Ice massage of A1pulley of 2nd digit  Several times during day  And use 2nd digit in light act

## 2018-03-15 ENCOUNTER — Ambulatory Visit: Payer: Medicare PPO | Admitting: Occupational Therapy

## 2018-03-15 DIAGNOSIS — M79645 Pain in left finger(s): Secondary | ICD-10-CM

## 2018-03-15 DIAGNOSIS — M25642 Stiffness of left hand, not elsewhere classified: Secondary | ICD-10-CM

## 2018-03-15 DIAGNOSIS — M6281 Muscle weakness (generalized): Secondary | ICD-10-CM

## 2018-03-15 DIAGNOSIS — R600 Localized edema: Secondary | ICD-10-CM

## 2018-03-15 NOTE — Patient Instructions (Signed)
Cont with same HEP  But add buddy strap for 2nd to 3rd digit - at proximal phalanges and then middle phalanges for hour each  - alternate and taking off for hour in between

## 2018-03-15 NOTE — Therapy (Signed)
[facility] [address] [place], [address] Phone: [phone]   Fax:  [phone]  Occupational Therapy Treatment  Patient Details  Name: Kathleen Campos MRN: [id number] Date of Birth: 1940-03-27 Referring Provider (OT): Candelaria Stagers   Encounter Date: 03/15/2018  OT End of Session - 03/15/18 1249    Visit Number  2    Number of Visits  8    Date for OT Re-Evaluation  04/07/18    OT Start Time  1210    OT Stop Time  1255    OT Time Calculation (min)  45 min    Activity Tolerance  Patient tolerated treatment well    Behavior During Therapy  [facility] for tasks assessed/performed       Past Medical History:  Diagnosis Date  . Diabetes mellitus without complication ([place])   . GERD (gastroesophageal reflux disease)   . Hyperlipidemia   . Hypertension   . Stroke [facility]) 2018   TIA    Past Surgical History:  Procedure Laterality Date  . BACK SURGERY  2003  . CERVICAL BIOPSY  W/ LOOP ELECTRODE EXCISION  2010  . CHOLECYSTECTOMY  1986  . COLONOSCOPY WITH PROPOFOL N/A 11/05/2016   Procedure: COLONOSCOPY WITH PROPOFOL;  Surgeon: Jonathon Bellows, MD;  Location: [facility] ENDOSCOPY;  Service: Gastroenterology;  Laterality: N/A;    There were no vitals filed for this visit.  Subjective Assessment - 03/15/18 1245    Subjective   I did my exercises 2 x day - but I think my finger is about the same - still stiff, swollen and pain with use - done the ice massage and ointment    Patient Stated Goals  I would like the pain , motion and use better in my finger and not stick it up in the air     Currently in Pain?  Yes    Pain Score  7     Pain Location  Finger (Comment which one)    Pain Orientation  Left    Pain Descriptors / Indicators  Tightness;Aching;Tender    Pain Type  Acute pain;Chronic pain    Pain Onset  More than a month ago    Aggravating Factors   with PROM          [facility] OT Assessment - 03/15/18 0001      Left Hand AROM   L Index  MCP 0-90  60 Degrees    L Index PIP 0-100  50 Degrees    L Index DIP 0-70  50 Degrees      AROM assess- see flowsheet  fuidotherapy done          OT Treatments/Exercises (OP) - 03/15/18 0001      Iontophoresis   Type of Iontophoresis  Dexamethasone    Location  2nd A1pulley     Dose  small patch - 2.0 current     Time  19 min      LUE Fluidotherapy   Number Minutes Fluidotherapy  8 Minutes    LUE Fluidotherapy Location  Hand    Comments  AROM at [facility] to decrease pain and increase stiffness       soft tissue mobs done to lateral bands of PIP and DIP of 2nd digit And over volar 2nd digit - graston tool nr 2 brushing prior to ROM   PROM of DIP and PIP 2nd digit AROM blocked DIP and PIP 2nd digit  10 reps  each tendonglides -  AROM  - can use buddy strap to proximal then middle phalanges with tendon glides   10 reps   pt ed on using  buddy strap for 2nd to 3rd digit - at proximal phalanges and then middle phalanges for hour each  - alternate and taking off for hour in between         OT Education - 03/15/18 1249    Education Details  progress and HEP - buddy strap add     Person(s) Educated  Patient    Methods  Explanation;Demonstration;Handout    Comprehension  Verbalized understanding;Returned demonstration       OT Short Term Goals - 03/10/18 1743      OT SHORT TERM GOAL #1   Title  Pain on PRWHE improve with more than 20 points     Baseline  PRWHE at eval for pain 39/50    Time  3    Period  Weeks    Status  New    Target Date  03/31/18      OT SHORT TERM GOAL #2   Title  Pt to be ind in HEP to decrease pain , increase ROM  an to show increase use of 2nd digit in L hand     Baseline  pain 8/10- and ROM MC 60, PIP 38 and DIP 45     Time  2    Period  Weeks    Status  New    Target Date  03/24/18        OT Long Term Goals - 03/10/18 1752      OT LONG TERM GOAL #1   Title  Pt L 2nd digit AROM improve for pt to touch  palm an able to grasp 2cm cylinder object without increase pain     Baseline  see flowsheet for ROM     Time  4    Period  Weeks    Status  New    Target Date  04/07/18      OT LONG TERM GOAL #2   Title  Pain in 2nd digit flexion improve to less than 2/10 with making fist    Baseline  pain 8/10 with use     Time  4    Period  Weeks    Status  New    Target Date  04/07/18      OT LONG TERM GOAL #3   Title  Function score on PRWHE improve with more than 15 points     Baseline  function score on PRWHE at eval 20/50    Time  4    Period  Weeks    Status  New    Target Date  04/07/18            Plan - 03/15/18 1249    Clinical Impression Statement  Pt present this date 2nd session with increase AROM at PIP and DIP - but MC still same - still tender over A1pulley of 2nd digit - add this date buddy strap to 2nd digit for [facility] and PIP flexion during the day alternate with hour off -and hour on -and then done ionto with dexemethazone for 2nd A1pulley to decrease pain and inflammation     Occupational performance deficits (Please refer to evaluation for details):  ADL's;IADL's;Play;Leisure    Rehab Potential  Fair    Current Impairments/barriers affecting progress:  injury was 6 months ago - tender over A1pulley of 2nd - but no triggering  OT Frequency  2x / week    OT Duration  4 weeks    OT Treatment/Interventions  Self-care/ADL training;Iontophoresis;Therapeutic exercise;Patient/family education;Splinting;Paraffin;Fluidtherapy;Contrast Bath;Ultrasound;Manual Therapy;Passive range of motion    Plan  assess progress with HEP     Clinical Decision Making  Several treatment options, min-mod task modification necessary    OT Home Exercise Plan  see pt instruction    Consulted and Agree with Plan of Care  Patient       Patient will benefit from skilled therapeutic intervention in order to improve the following deficits and impairments:  Pain, Impaired flexibility, Increased edema,  Decreased strength, Decreased range of motion, Impaired UE functional use  Visit Diagnosis: Pain in left finger(s)  Stiffness of left hand, not elsewhere classified  Muscle weakness (generalized)  Localized edema    Problem List Patient Active Problem List   Diagnosis Date Noted  . Hyperlipidemia due to type 2 diabetes mellitus ([place]) 11/25/2016  . Abnormal brain MRI 02/17/2016  . Nausea 05/02/2015  . GERD (gastroesophageal reflux disease) 04/24/2015  . Dizziness 04/24/2015  . Otitis externa 12/21/2014  . Allergic rhinitis 07/18/2014  . Colon polyp 07/18/2014  . Acid reflux 07/18/2014  . Hypercholesteremia 07/18/2014  . Low serum cobalamin 07/18/2014  . Adult BMI 30+ 07/18/2014  . Avitaminosis D 07/18/2014  . Diabetes mellitus, type 2 ([place]) 09/09/1998  . AG (atrophic gastritis) 03/06/1998  . HTN (hypertension) 06/04/1994    Rosalyn Gess OTR/L,CLT 03/15/2018, 1:20 PM  [facility] [address], [place], [address] Phone: [phone]   Fax:  [phone]  Name: Kathleen Campos MRN: [id number] Date of Birth: December 29, 1940

## 2018-03-17 ENCOUNTER — Ambulatory Visit: Payer: Medicare PPO | Admitting: Occupational Therapy

## 2018-03-17 DIAGNOSIS — M79645 Pain in left finger(s): Secondary | ICD-10-CM | POA: Diagnosis not present

## 2018-03-17 DIAGNOSIS — R600 Localized edema: Secondary | ICD-10-CM

## 2018-03-17 DIAGNOSIS — M25642 Stiffness of left hand, not elsewhere classified: Secondary | ICD-10-CM

## 2018-03-17 DIAGNOSIS — M6281 Muscle weakness (generalized): Secondary | ICD-10-CM

## 2018-03-17 NOTE — Therapy (Signed)
[facility] [address] [place], [address] Phone: [phone]   Fax:  [phone]  Occupational Therapy Treatment  Patient Details  Name: Kathleen Campos MRN: [id number] Date of Birth: Oct 31, 1940 Referring Provider (OT): Candelaria Stagers   Encounter Date: 03/17/2018  OT End of Session - 03/17/18 1525    Visit Number  3    Number of Visits  8    Date for OT Re-Evaluation  04/07/18    OT Start Time  1503    OT Stop Time  1600    OT Time Calculation (min)  57 min    Activity Tolerance  Patient tolerated treatment well    Behavior During Therapy  [facility] for tasks assessed/performed       Past Medical History:  Diagnosis Date  . Diabetes mellitus without complication ([place])   . GERD (gastroesophageal reflux disease)   . Hyperlipidemia   . Hypertension   . Stroke [facility]) 2018   TIA    Past Surgical History:  Procedure Laterality Date  . BACK SURGERY  2003  . CERVICAL BIOPSY  W/ LOOP ELECTRODE EXCISION  2010  . CHOLECYSTECTOMY  1986  . COLONOSCOPY WITH PROPOFOL N/A 11/05/2016   Procedure: COLONOSCOPY WITH PROPOFOL;  Surgeon: Jonathon Bellows, MD;  Location: [facility] ENDOSCOPY;  Service: Gastroenterology;  Laterality: N/A;    There were no vitals filed for this visit.  Subjective Assessment - 03/17/18 1523    Subjective   Did okay my exercises- I am oing the ice massage but it is still ache and pain over knuckle -and tight     Patient Stated Goals  I would like the pain , motion and use better in my finger and not stick it up in the air     Pain Location  Finger (Comment which one)    Pain Orientation  Left    Pain Descriptors / Indicators  Aching;Tightness;Tender;Sharp    Pain Onset  1 to 4 weeks ago    Pain Frequency  Intermittent    Aggravating Factors   with PROM         [facility] OT Assessment - 03/17/18 0001      Left Hand AROM   L Index  MCP 0-90  60 Degrees    L Index PIP 0-100  60 Degrees    L Index DIP  0-70  50 Degrees             skin check prior and pt kept on at end of session for hour   OT Treatments/Exercises (OP) - 03/17/18 0001      Iontophoresis   Type of Iontophoresis  Dexamethasone    Location  2nd A1pulley     Dose  small patch - 2.0 current     Time  19 min      LUE Fluidotherapy   Number Minutes Fluidotherapy  8 Minutes    LUE Fluidotherapy Location  Hand    Comments  at [facility] to increase ROM        soft tissue mobs done to lateral bands of PIP and DIP of 2nd digit And over volar 2nd digit - graston tool nr 2 brushing prior to ROM   PROM of DIP and PIP 2nd digit Prolonged flexion stretch done to DIP , PIP and then composite flexion f 2nd digit -  Pain 5/10 -at stretch of 65 at PIP and MC  AROM blocked DIP and PIP 2nd digit  10 reps  each tendonglides - AROM  - can use buddy strap to proximal then middle phalanges with tendon glides  10 reps   pt ed on using  buddy strap for 2nd to 3rd digit - at proximal phalanges and then middle phalanges for hour each  - alternate and taking off for hour in between         OT Education - 03/17/18 1525    Education Details  progress and HEP - buddy strap use     Person(s) Educated  Patient    Methods  Explanation;Demonstration;Handout    Comprehension  Verbalized understanding;Returned demonstration       OT Short Term Goals - 03/10/18 1743      OT SHORT TERM GOAL #1   Title  Pain on PRWHE improve with more than 20 points     Baseline  PRWHE at eval for pain 39/50    Time  3    Period  Weeks    Status  New    Target Date  03/31/18      OT SHORT TERM GOAL #2   Title  Pt to be ind in HEP to decrease pain , increase ROM  an to show increase use of 2nd digit in L hand     Baseline  pain 8/10- and ROM MC 60, PIP 38 and DIP 45     Time  2    Period  Weeks    Status  New    Target Date  03/24/18        OT Long Term Goals - 03/10/18 1752      OT LONG TERM GOAL #1   Title  Pt L 2nd digit AROM  improve for pt to touch palm an able to grasp 2cm cylinder object without increase pain     Baseline  see flowsheet for ROM     Time  4    Period  Weeks    Status  New    Target Date  04/07/18      OT LONG TERM GOAL #2   Title  Pain in 2nd digit flexion improve to less than 2/10 with making fist    Baseline  pain 8/10 with use     Time  4    Period  Weeks    Status  New    Target Date  04/07/18      OT LONG TERM GOAL #3   Title  Function score on PRWHE improve with more than 15 points     Baseline  function score on PRWHE at eval 20/50    Time  4    Period  Weeks    Status  New    Target Date  04/07/18            Plan - 03/17/18 1527    Clinical Impression Statement  Pt tight in DIP , PIP and MC flexion of 2nd digit - pain with PROM - at 60-65 degrees of MC and PIP - repeat 2nd session of ionto to 2nd A1pulley this date - cont to be tender and edema - severe tightness in 2nd digit all joints     Occupational performance deficits (Please refer to evaluation for details):  ADL's;IADL's;Play;Leisure    Rehab Potential  Fair    Current Impairments/barriers affecting progress:  injury was 6 months ago - tender over A1pulley of 2nd - but no triggering     OT Frequency  2x / week    OT Duration  4 weeks    OT Treatment/Interventions  Self-care/ADL training;Iontophoresis;Therapeutic exercise;Patient/family education;Splinting;Paraffin;Fluidtherapy;Contrast Bath;Ultrasound;Manual Therapy;Passive range of motion    Plan  assess progress with HEP     Clinical Decision Making  Several treatment options, min-mod task modification necessary    OT Home Exercise Plan  see pt instruction    Consulted and Agree with Plan of Care  Patient       Patient will benefit from skilled therapeutic intervention in order to improve the following deficits and impairments:  Pain, Impaired flexibility, Increased edema, Decreased strength, Decreased range of motion, Impaired UE functional use  Visit  Diagnosis: Pain in left finger(s)  Stiffness of left hand, not elsewhere classified  Muscle weakness (generalized)  Localized edema    Problem List Patient Active Problem List   Diagnosis Date Noted  . Hyperlipidemia due to type 2 diabetes mellitus ([place]) 11/25/2016  . Abnormal brain MRI 02/17/2016  . Nausea 05/02/2015  . GERD (gastroesophageal reflux disease) 04/24/2015  . Dizziness 04/24/2015  . Otitis externa 12/21/2014  . Allergic rhinitis 07/18/2014  . Colon polyp 07/18/2014  . Acid reflux 07/18/2014  . Hypercholesteremia 07/18/2014  . Low serum cobalamin 07/18/2014  . Adult BMI 30+ 07/18/2014  . Avitaminosis D 07/18/2014  . Diabetes mellitus, type 2 ([place]) 09/09/1998  . AG (atrophic gastritis) 03/06/1998  . HTN (hypertension) 06/04/1994    Rosalyn Gess OTR/L,CLT 03/17/2018, 6:23 PM  [facility] [address] [place], [address] Phone: [phone]   Fax:  [phone]  Name: Kathleen Campos MRN: [id number] Date of Birth: 08-01-1940

## 2018-03-17 NOTE — Patient Instructions (Signed)
Same HEP  

## 2018-03-23 ENCOUNTER — Ambulatory Visit: Payer: Medicare PPO | Admitting: Occupational Therapy

## 2018-03-23 DIAGNOSIS — M79645 Pain in left finger(s): Secondary | ICD-10-CM | POA: Diagnosis not present

## 2018-03-23 DIAGNOSIS — M6281 Muscle weakness (generalized): Secondary | ICD-10-CM

## 2018-03-23 DIAGNOSIS — R600 Localized edema: Secondary | ICD-10-CM

## 2018-03-23 DIAGNOSIS — M25642 Stiffness of left hand, not elsewhere classified: Secondary | ICD-10-CM

## 2018-03-23 NOTE — Therapy (Signed)
[place] [facility] [address], [place], [address] Phone: [phone]   Fax:  [phone]  Occupational Therapy Treatment  Patient Details  Name: Kathleen Campos MRN: [id number] Date of Birth: 1941/02/16 Referring Provider (OT): Candelaria Stagers   Encounter Date: 03/23/2018  OT End of Session - 03/23/18 1548    Visit Number  4    Number of Visits  8    Date for OT Re-Evaluation  04/07/18    OT Start Time  1438    OT Stop Time  1530    OT Time Calculation (min)  52 min    Activity Tolerance  Patient tolerated treatment well    Behavior During Therapy  [facility] for tasks assessed/performed       Past Medical History:  Diagnosis Date  . Diabetes mellitus without complication ([place])   . GERD (gastroesophageal reflux disease)   . Hyperlipidemia   . Hypertension   . Stroke [facility]) 2018   TIA    Past Surgical History:  Procedure Laterality Date  . BACK SURGERY  2003  . CERVICAL BIOPSY  W/ LOOP ELECTRODE EXCISION  2010  . CHOLECYSTECTOMY  1986  . COLONOSCOPY WITH PROPOFOL N/A 11/05/2016   Procedure: COLONOSCOPY WITH PROPOFOL;  Surgeon: Jonathon Bellows, MD;  Location: [facility] ENDOSCOPY;  Service: Gastroenterology;  Laterality: N/A;    There were no vitals filed for this visit.  Subjective Assessment - 03/23/18 1538    Subjective   About the same than last time - I just took the buddy strap off - not hurting at rest -just when I bend it     Patient Stated Goals  I would like the pain , motion and use better in my finger and not stick it up in the air          [facility] OT Assessment - 03/23/18 0001      Left Hand AROM   L Index  MCP 0-90  65 Degrees    L Index PIP 0-100  60 Degrees             skin check done prior to ionto and pt to keep it on for about hour afterwards   OT Treatments/Exercises (OP) - 03/23/18 0001      Iontophoresis   Type of Iontophoresis  Dexamethasone    Location  2nd A1pulley     Dose   small patch - 1.6 current and then later increase to 2.0 current     Time  71min      LUE Paraffin   Number Minutes Paraffin  10 Minutes    LUE Paraffin Location  Hand    Comments  Flexion coban wrap to 2nd digit at [facility]       soft tissue mobs done to lateral bands of PIP and DIP of 2nd digit And over volar 2nd digit - graston tool nr 2 brushing prior to ROM  PROM of DIP and PIP 2nd digit Prolonged flexion stretch done to DIP , PIP and then composite flexion attempt 2nd digit -  Pain 5/10 -at stretch of 65 at PIP and MC  Made DIP/PIP strap to wear 1 min x 3 -  3 day add to HEP  AROM blocked DIP and PIP 2nd digit  10 reps each tendonglides- AROM - can use buddy strap to proximal then middle phalanges with tendon glides  10 reps  pt ed on using buddy strap for 2nd to 3rd digit - at  proximal phalanges and then middle phalanges for hour each - alternate and taking off for hour in between  And use of DIP/PIP flexion strap         OT Education - 03/23/18 1548    Education Details  add DIP/PIP strap to HEP     Person(s) Educated  Patient    Methods  Explanation;Demonstration;Handout    Comprehension  Verbalized understanding;Returned demonstration       OT Short Term Goals - 03/10/18 1743      OT SHORT TERM GOAL #1   Title  Pain on PRWHE improve with more than 20 points     Baseline  PRWHE at eval for pain 39/50    Time  3    Period  Weeks    Status  New    Target Date  03/31/18      OT SHORT TERM GOAL #2   Title  Pt to be ind in HEP to decrease pain , increase ROM  an to show increase use of 2nd digit in L hand     Baseline  pain 8/10- and ROM MC 60, PIP 38 and DIP 45     Time  2    Period  Weeks    Status  New    Target Date  03/24/18        OT Long Term Goals - 03/10/18 1752      OT LONG TERM GOAL #1   Title  Pt L 2nd digit AROM improve for pt to touch palm an able to grasp 2cm cylinder object without increase pain     Baseline  see flowsheet for  ROM     Time  4    Period  Weeks    Status  New    Target Date  04/07/18      OT LONG TERM GOAL #2   Title  Pain in 2nd digit flexion improve to less than 2/10 with making fist    Baseline  pain 8/10 with use     Time  4    Period  Weeks    Status  New    Target Date  04/07/18      OT LONG TERM GOAL #3   Title  Function score on PRWHE improve with more than 15 points     Baseline  function score on PRWHE at eval 20/50    Time  4    Period  Weeks    Status  New    Target Date  04/07/18            Plan - 03/23/18 1549    Clinical Impression Statement  Pt is tight in DIP , PIP and MC - not as tender this date over A1pulley as the last few times - has been doing ionto - pt still about 60-65 flexion at PIP and MC - pain with PROM - did add this date prolonged stretch to DIP and PIP - and flexion strap for HEP to use 3 x 30min PIP/DIP strap - assess tolerance and progress    Occupational performance deficits (Please refer to evaluation for details):  ADL's;IADL's;Play;Leisure    Rehab Potential  Fair    Current Impairments/barriers affecting progress:  injury was 6 months ago - tender over A1pulley of 2nd - but no triggering     OT Frequency  2x / week    OT Duration  4 weeks    OT Treatment/Interventions  Self-care/ADL training;Iontophoresis;Therapeutic exercise;Patient/family education;Splinting;Paraffin;Fluidtherapy;Contrast Bath;Ultrasound;Manual  Therapy;Passive range of motion    Plan  assess progress with HEP     Clinical Decision Making  Several treatment options, min-mod task modification necessary    OT Home Exercise Plan  see pt instruction    Consulted and Agree with Plan of Care  Patient       Patient will benefit from skilled therapeutic intervention in order to improve the following deficits and impairments:  Pain, Impaired flexibility, Increased edema, Decreased strength, Decreased range of motion, Impaired UE functional use  Visit Diagnosis: Pain in left  finger(s)  Stiffness of left hand, not elsewhere classified  Muscle weakness (generalized)  Localized edema    Problem List Patient Active Problem List   Diagnosis Date Noted  . Hyperlipidemia due to type 2 diabetes mellitus ([place]) 11/25/2016  . Abnormal brain MRI 02/17/2016  . Nausea 05/02/2015  . GERD (gastroesophageal reflux disease) 04/24/2015  . Dizziness 04/24/2015  . Otitis externa 12/21/2014  . Allergic rhinitis 07/18/2014  . Colon polyp 07/18/2014  . Acid reflux 07/18/2014  . Hypercholesteremia 07/18/2014  . Low serum cobalamin 07/18/2014  . Adult BMI 30+ 07/18/2014  . Avitaminosis D 07/18/2014  . Diabetes mellitus, type 2 ([place]) 09/09/1998  . AG (atrophic gastritis) 03/06/1998  . HTN (hypertension) 06/04/1994    Rosalyn Gess OTR/L,CLT 03/23/2018, 3:51 PM  [place] [facility] [address], [place], [address] Phone: [phone]   Fax:  [phone]  Name: Kathleen Campos MRN: [id number] Date of Birth: 1940/08/06

## 2018-03-23 NOTE — Patient Instructions (Signed)
Add after some PROM of DIP,PIP and MC - add flexion strap for DIP/PIP for 3 x 1 min  And followed by tendon glide  To do 3 x day

## 2018-03-25 ENCOUNTER — Ambulatory Visit: Payer: Medicare PPO | Admitting: Occupational Therapy

## 2018-03-25 DIAGNOSIS — M79645 Pain in left finger(s): Secondary | ICD-10-CM | POA: Diagnosis not present

## 2018-03-25 DIAGNOSIS — R600 Localized edema: Secondary | ICD-10-CM

## 2018-03-25 DIAGNOSIS — M6281 Muscle weakness (generalized): Secondary | ICD-10-CM

## 2018-03-25 DIAGNOSIS — M25642 Stiffness of left hand, not elsewhere classified: Secondary | ICD-10-CM

## 2018-03-25 NOTE — Patient Instructions (Signed)
Reinforce HEP again with pt - sequence

## 2018-03-25 NOTE — Therapy (Signed)
[place] [address] [place], [address] Phone: [phone]   Fax:  [phone]  Occupational Therapy Treatment  Patient Details  Name: Kathleen Campos MRN: [id number] Date of Birth: [birth year] Referring Provider (OT): Candelaria Stagers   Encounter Date: 03/25/2018  OT End of Session - 03/25/18 1431    Visit Number  5    Number of Visits  8    Date for OT Re-Evaluation  04/07/18    OT Start Time  5465    OT Stop Time  1331    OT Time Calculation (min)  56 min    Activity Tolerance  Patient tolerated treatment well    Behavior During Therapy  [facility] for tasks assessed/performed       Past Medical History:  Diagnosis Date  . Diabetes mellitus without complication ([place])   . GERD (gastroesophageal reflux disease)   . Hyperlipidemia   . Hypertension   . Stroke [facility]) 2018   TIA    Past Surgical History:  Procedure Laterality Date  . BACK SURGERY  2003  . CERVICAL BIOPSY  W/ LOOP ELECTRODE EXCISION  2010  . CHOLECYSTECTOMY  1986  . COLONOSCOPY WITH PROPOFOL N/A 11/05/2016   Procedure: COLONOSCOPY WITH PROPOFOL;  Surgeon: Jonathon Bellows, MD;  Location: [facility] ENDOSCOPY;  Service: Gastroenterology;  Laterality: N/A;    There were no vitals filed for this visit.  Subjective Assessment - 03/25/18 1428    Subjective   I think I can bend my finger little more and with less pain - did the strap you made me - pulled and hold it to give little more of pull     Patient Stated Goals  I would like the pain , motion and use better in my finger and not stick it up in the air     Currently in Pain?  Yes    Pain Score  4     Pain Location  Finger (Comment which one)    Pain Orientation  Left    Pain Descriptors / Indicators  Aching;Sharp;Tightness    Pain Type  Acute pain;Chronic pain    Pain Onset  More than a month ago    Pain Frequency  Intermittent    Aggravating Factors   WIth PROM and over A1pulley of 2nd           [facility] OT Assessment - 03/25/18 0001      Left Hand AROM   L Index  MCP 0-90  65 Degrees   70 in session   L Index PIP 0-100  65 Degrees   72 in session             skin check done prior and to keep patch on hour afterwards  OT Treatments/Exercises (OP) - 03/25/18 0001      Iontophoresis   Type of Iontophoresis  Dexamethasone    Location  2nd A1pulley     Dose  small patch - 1.6 current and then later increase to 2.0 current     Time  69min      LUE Paraffin   Number Minutes Paraffin  10 Minutes    LUE Paraffin Location  Hand    Comments  flexion coban strap to 2nd digit at [facility]         soft tissue mobs done to lateral bands of PIP and DIP of 2nd digit And over volar 2nd digit done graston tool nr 2 brushing prior to ROM  PROM of DIP and PIP 2nd digit Prolonged flexion stretch done to DIP , PIP and then composite flexion attempt 2nd digit -  Pain 4/10 -at stretch of  65-70 at PIP and MC Cont at home  DIP/PIP strap to wear 1 min x 3 -   AROM blocked DIP and PIP 2nd digit  10 repseach tendonglides- AROM - can use buddy strap to proximal then middle phalanges with tendon glides - focus on MC flexion first , then claw , then composite to 4 cm foam block - was able to touch 4 cm foam block this date end of session -  10 reps  Review sequence of HEP - and use of  DIP/PIP flexion strap       OT Education - 03/25/18 1430    Education Details  DIP/PIP strap to HEP but reinforce composite fist to 4 cm foam block -and then to 2 cm foamblock try    Person(s) Educated  Patient    Methods  Explanation;Demonstration;Handout    Comprehension  Verbalized understanding;Returned demonstration       OT Short Term Goals - 03/10/18 1743      OT SHORT TERM GOAL #1   Title  Pain on PRWHE improve with more than 20 points     Baseline  PRWHE at eval for pain 39/50    Time  3    Period  Weeks    Status  New    Target Date  03/31/18      OT SHORT TERM GOAL #2    Title  Pt to be ind in HEP to decrease pain , increase ROM  an to show increase use of 2nd digit in L hand     Baseline  pain 8/10- and ROM MC 60, PIP 38 and DIP 45     Time  2    Period  Weeks    Status  New    Target Date  03/24/18        OT Long Term Goals - 03/10/18 1752      OT LONG TERM GOAL #1   Title  Pt L 2nd digit AROM improve for pt to touch palm an able to grasp 2cm cylinder object without increase pain     Baseline  see flowsheet for ROM     Time  4    Period  Weeks    Status  New    Target Date  04/07/18      OT LONG TERM GOAL #2   Title  Pain in 2nd digit flexion improve to less than 2/10 with making fist    Baseline  pain 8/10 with use     Time  4    Period  Weeks    Status  New    Target Date  04/07/18      OT LONG TERM GOAL #3   Title  Function score on PRWHE improve with more than 15 points     Baseline  function score on PRWHE at eval 20/50    Time  4    Period  Weeks    Status  New    Target Date  04/07/18            Plan - 03/25/18 1434    Clinical Impression Statement  Initiate last visit Paraffin bath with flexion strap to 2nd digit - pain was more than 5/10 stretch and PROM - and add DIP/PIP strap to use at home - appear after this  date repeating process had more gaines and less pain - cont with POC and  review sequence of HEP with pt and to focus on composite flexion at end of session to 4 cm foam block - got this date 70-75 flexion at [employer] performance deficits (Please refer to evaluation for details):  ADL's;IADL's;Play;Leisure    Rehab Potential  Fair    Current Impairments/barriers affecting progress:  injury was 6 months ago - tender over A1pulley of 2nd - but no triggering     OT Frequency  2x / week    OT Duration  2 weeks    OT Treatment/Interventions  Self-care/ADL training;Iontophoresis;Therapeutic exercise;Patient/family education;Splinting;Paraffin;Fluidtherapy;Contrast Bath;Ultrasound;Manual Therapy;Passive  range of motion    Plan  assess progress with HEP     Clinical Decision Making  Several treatment options, min-mod task modification necessary    OT Home Exercise Plan  see pt instruction    Consulted and Agree with Plan of Care  Patient       Patient will benefit from skilled therapeutic intervention in order to improve the following deficits and impairments:     Visit Diagnosis: Pain in left finger(s)  Stiffness of left hand, not elsewhere classified  Muscle weakness (generalized)  Localized edema    Problem List Patient Active Problem List   Diagnosis Date Noted  . Hyperlipidemia due to type 2 diabetes mellitus ([place]) 11/25/2016  . Abnormal brain MRI 02/17/2016  . Nausea 05/02/2015  . GERD (gastroesophageal reflux disease) 04/24/2015  . Dizziness 04/24/2015  . Otitis externa 12/21/2014  . Allergic rhinitis 07/18/2014  . Colon polyp 07/18/2014  . Acid reflux 07/18/2014  . Hypercholesteremia 07/18/2014  . Low serum cobalamin 07/18/2014  . Adult BMI 30+ 07/18/2014  . Avitaminosis D 07/18/2014  . Diabetes mellitus, type 2 ([place]) 09/09/1998  . AG (atrophic gastritis) 03/06/1998  . HTN (hypertension) 06/04/1994    Rosalyn Gess OTR/L,CLT 03/25/2018, 2:38 PM  [place] [facility] [address], [place], [address] Phone: [phone]   Fax:  [phone]  Name: Kathleen Campos MRN: [id number] Date of Birth: 06/27/1940

## 2018-04-01 ENCOUNTER — Ambulatory Visit: Payer: Medicare PPO | Admitting: Occupational Therapy

## 2018-04-01 DIAGNOSIS — M25642 Stiffness of left hand, not elsewhere classified: Secondary | ICD-10-CM

## 2018-04-01 DIAGNOSIS — M79645 Pain in left finger(s): Secondary | ICD-10-CM

## 2018-04-01 DIAGNOSIS — R600 Localized edema: Secondary | ICD-10-CM

## 2018-04-01 DIAGNOSIS — M6281 Muscle weakness (generalized): Secondary | ICD-10-CM

## 2018-04-01 NOTE — Patient Instructions (Signed)
Same but keep pain under 4/10  And add light blue putty to gripping - PIP flexion to about 75 degrees- only 15 reps after each session of ROM

## 2018-04-01 NOTE — Therapy (Signed)
[facility] [address], [place], [address] Phone: [phone]   Fax:  [phone]  Occupational Therapy Treatment  Patient Details  Name: Kathleen Campos MRN: [id number] Date of Birth: 06/29/1940 Referring Provider (OT): Candelaria Stagers   Encounter Date: 04/01/2018  OT End of Session - 04/01/18 1449    Visit Number  6    Number of Visits  8    Date for OT Re-Evaluation  04/07/18    OT Start Time  1324    OT Stop Time  1318    OT Time Calculation (min)  58 min    Activity Tolerance  Patient tolerated treatment well    Behavior During Therapy  [facility] for tasks assessed/performed       Past Medical History:  Diagnosis Date  . Diabetes mellitus without complication ([place])   . GERD (gastroesophageal reflux disease)   . Hyperlipidemia   . Hypertension   . Stroke [facility]) 2018   TIA    Past Surgical History:  Procedure Laterality Date  . BACK SURGERY  2003  . CERVICAL BIOPSY  W/ LOOP ELECTRODE EXCISION  2010  . CHOLECYSTECTOMY  1986  . COLONOSCOPY WITH PROPOFOL N/A 11/05/2016   Procedure: COLONOSCOPY WITH PROPOFOL;  Surgeon: Jonathon Bellows, MD;  Location: [facility] ENDOSCOPY;  Service: Gastroenterology;  Laterality: N/A;    There were no vitals filed for this visit.  Subjective Assessment - 04/01/18 1446    Subjective   I am working my finger more - bending it and pushing it - I think it is bending better     Patient Stated Goals  I would like the pain , motion and use better in my finger and not stick it up in the air     Currently in Pain?  Yes    Pain Score  --   4-8   Pain Location  Finger (Comment which one)    Pain Orientation  Left    Pain Descriptors / Indicators  Aching;Tightness;Shooting    Aggravating Factors   With PROM and putty gripping today -pt to not push more than 4/10 pain          [facility] OT Assessment - 04/01/18 0001      Left Hand AROM   L Index  MCP 0-90  65 Degrees    L Index PIP  0-100  70 Degrees   75 AROM in session and PROM 80             skin check done prior and pt to keep patch on for hour afterwards   OT Treatments/Exercises (OP) - 04/01/18 0001      Iontophoresis   Type of Iontophoresis  Dexamethasone    Location  2nd A1pulley     Dose  small patch - 2.0 current    Time  19      LUE Paraffin   Number Minutes Paraffin  10 Minutes    LUE Paraffin Location  Hand    Comments  Flexion coban wrap to 2nd digit composite flexion        soft tissue mobs done to lateral bands of PIP and DIP of 2nd digit And over volar 2nd digit done graston tool nr 2 brushing prior to ROM Cont to be  Tender over A1 pulley of 2nd   PROM of DIP and PIP 2nd digit Prolonged flexion stretch done to DIP , PIP and then composite flexionattempt2nd digit -  Pain 4/10 -at  stretch of  70-75 at PIP and MC Cont at home  DIP/PIP strap to wear 1 min x 3 -   AROM blocked DIP and PIP 2nd digit  10 repseach tendonglides- AROM - can use buddy strap to proximal then middle phalanges with tendon glides - focus on MC flexion first , then claw , then composite to 4 cm foam block   10 reps  Review sequence of HEP - and use of  DIP/PIP flexion strap    add and review light blue putty to gripping - - pt pain was 8/10 - ed pt not to over do and keep pain about 4/10 - and stick with 15 reps - 3 x day  Was able to do  PIP flexion to about 75 degrees- into putty  Add to HEP    OT Education - 04/01/18 1449    Education Details  reinforce composite fist to 4 cm foam block -and then to 2 cm foamblock try - and light blue putty - but not over do - keep pain under 4/10     Person(s) Educated  Patient    Methods  Explanation;Demonstration;Handout    Comprehension  Verbalized understanding;Returned demonstration       OT Short Term Goals - 03/10/18 1743      OT SHORT TERM GOAL #1   Title  Pain on PRWHE improve with more than 20 points     Baseline  PRWHE at eval for pain  39/50    Time  3    Period  Weeks    Status  New    Target Date  03/31/18      OT SHORT TERM GOAL #2   Title  Pt to be ind in HEP to decrease pain , increase ROM  an to show increase use of 2nd digit in L hand     Baseline  pain 8/10- and ROM MC 60, PIP 38 and DIP 45     Time  2    Period  Weeks    Status  New    Target Date  03/24/18        OT Long Term Goals - 03/10/18 1752      OT LONG TERM GOAL #1   Title  Pt L 2nd digit AROM improve for pt to touch palm an able to grasp 2cm cylinder object without increase pain     Baseline  see flowsheet for ROM     Time  4    Period  Weeks    Status  New    Target Date  04/07/18      OT LONG TERM GOAL #2   Title  Pain in 2nd digit flexion improve to less than 2/10 with making fist    Baseline  pain 8/10 with use     Time  4    Period  Weeks    Status  New    Target Date  04/07/18      OT LONG TERM GOAL #3   Title  Function score on PRWHE improve with more than 15 points     Baseline  function score on PRWHE at eval 20/50    Time  4    Period  Weeks    Status  New    Target Date  04/07/18            Plan - 04/01/18 1450    Clinical Impression Statement  Pt showing progress in flexion of 2nd PIP with  coban flexion stretch in paraffin the last 3 sessions - cont to have pain over 2nd A1pulley with even use of ionto at end of seesion - pt showed this date AROM of PIP 70-75 degrees and PROM 80- did add light putty this date for gripping     Occupational performance deficits (Please refer to evaluation for details):  ADL's;IADL's;Play;Leisure    Rehab Potential  Fair    Current Impairments/barriers affecting progress:  injury was 6 months ago - tender over A1pulley of 2nd - but no triggering     OT Frequency  2x / week    OT Duration  2 weeks    OT Treatment/Interventions  Self-care/ADL training;Iontophoresis;Therapeutic exercise;Patient/family education;Splinting;Paraffin;Fluidtherapy;Contrast Bath;Ultrasound;Manual  Therapy;Passive range of motion    Plan  assess progress with HEP and upgrade - assess how did with putty     Clinical Decision Making  Several treatment options, min-mod task modification necessary    OT Home Exercise Plan  see pt instruction    Consulted and Agree with Plan of Care  Patient       Patient will benefit from skilled therapeutic intervention in order to improve the following deficits and impairments:  Pain, Impaired flexibility, Increased edema, Decreased strength, Decreased range of motion, Impaired UE functional use  Visit Diagnosis: Pain in left finger(s)  Stiffness of left hand, not elsewhere classified  Localized edema  Muscle weakness (generalized)    Problem List Patient Active Problem List   Diagnosis Date Noted  . Hyperlipidemia due to type 2 diabetes mellitus ([place]) 11/25/2016  . Abnormal brain MRI 02/17/2016  . Nausea 05/02/2015  . GERD (gastroesophageal reflux disease) 04/24/2015  . Dizziness 04/24/2015  . Otitis externa 12/21/2014  . Allergic rhinitis 07/18/2014  . Colon polyp 07/18/2014  . Acid reflux 07/18/2014  . Hypercholesteremia 07/18/2014  . Low serum cobalamin 07/18/2014  . Adult BMI 30+ 07/18/2014  . Avitaminosis D 07/18/2014  . Diabetes mellitus, type 2 ([place]) 09/09/1998  . AG (atrophic gastritis) 03/06/1998  . HTN (hypertension) 06/04/1994    Rosalyn Gess OTR/L,CLT 04/01/2018, 2:53 PM  [place] [facility] [address], [place], [address] Phone: [phone]   Fax:  [phone]  Name: JENNFIER ABDULLA MRN: [id number] Date of Birth: Feb 13, 1941

## 2018-04-06 ENCOUNTER — Ambulatory Visit: Payer: Medicare PPO | Admitting: Occupational Therapy

## 2018-04-06 DIAGNOSIS — M79645 Pain in left finger(s): Secondary | ICD-10-CM | POA: Diagnosis not present

## 2018-04-06 DIAGNOSIS — M6281 Muscle weakness (generalized): Secondary | ICD-10-CM

## 2018-04-06 DIAGNOSIS — M25642 Stiffness of left hand, not elsewhere classified: Secondary | ICD-10-CM

## 2018-04-06 DIAGNOSIS — R600 Localized edema: Secondary | ICD-10-CM

## 2018-04-06 NOTE — Therapy (Signed)
[place] [facility] [address] [place], [address] Phone: [phone]   Fax:  [phone]  Occupational Therapy Treatment  Patient Details  Name: Kathleen Campos MRN: [id number] Date of Birth: [birth year] Referring Provider (OT): Candelaria Stagers   Encounter Date: 04/06/2018  OT End of Session - 04/06/18 1528    Visit Number  7    Number of Visits  8    Date for OT Re-Evaluation  04/07/18    OT Start Time  8841    OT Stop Time  1608    OT Time Calculation (min)  53 min    Activity Tolerance  Patient tolerated treatment well    Behavior During Therapy  [facility] for tasks assessed/performed       Past Medical History:  Diagnosis Date  . Diabetes mellitus without complication ([place])   . GERD (gastroesophageal reflux disease)   . Hyperlipidemia   . Hypertension   . Stroke [facility]) 2018   TIA    Past Surgical History:  Procedure Laterality Date  . BACK SURGERY  2003  . CERVICAL BIOPSY  W/ LOOP ELECTRODE EXCISION  2010  . CHOLECYSTECTOMY  1986  . COLONOSCOPY WITH PROPOFOL N/A 11/05/2016   Procedure: COLONOSCOPY WITH PROPOFOL;  Surgeon: Jonathon Bellows, MD;  Location: [facility] ENDOSCOPY;  Service: Gastroenterology;  Laterality: N/A;    There were no vitals filed for this visit.  Subjective Assessment - 04/06/18 1520    Patient Stated Goals  I would like the pain , motion and use better in my finger and not stick it up in the air          [facility] OT Assessment - 04/06/18 0001      Left Hand AROM   L Index  MCP 0-90  65 Degrees    L Index PIP 0-100  70 Degrees   70 coming in ; 80 in session and PROM 85      Pt report using her 2nd digit more - not keeping it extended like she done in past         OT Treatments/Exercises (OP) - 04/06/18 0001      Iontophoresis   Type of Iontophoresis  Dexamethasone    Location  2nd A1pulley     Dose  small patch 1.6 intensity     Time  24      LUE Paraffin   Number Minutes  Paraffin  10 Minutes    LUE Paraffin Location  Hand    Comments  coban flexion wrap to DIP/PIP bend         soft tissue mobs done to lateral bands of PIP and DIP of 2nd digit And over volar 2nd digitdonegraston tool nr 2 brushing prior to ROM Cont to be  Tender over A1 pulley of 2nd   PROM of DIP and PIP 2nd digit Prolonged flexion stretch done to DIP , PIP and then composite flexionattempt2nd digit -  No pain to 70-75at PIP AROM and PROM  Pain increase with AROM and PROM 80 to 85 degrees  Cont at homeDIP/PIP strap to wear 1 min x 3 -   AROM blocked DIP and PIP 2nd digit  10 repseach tendonglides- AROM - can use buddy strap to proximal then middle phalanges with tendon glides - focus on MC flexion first , then claw , then composite to 2 cm foam block  10 reps  Review sequence of HEP - and use ofDIP/PIP flexion strap cont with  light blue putty for gripping - pain should not be more than 4/10 - and stick with 15 reps - 3 x day  Was able to do  PIP flexion to about 75 degrees- into putty         OT Education - 04/06/18 1528    Education Details  reinforce composite fist to 2cm foam block - and light blue putty - but not over do - keep pain under 4/10     Person(s) Educated  Patient    Methods  Explanation;Demonstration;Handout    Comprehension  Verbalized understanding;Returned demonstration       OT Short Term Goals - 03/10/18 1743      OT SHORT TERM GOAL #1   Title  Pain on PRWHE improve with more than 20 points     Baseline  PRWHE at eval for pain 39/50    Time  3    Period  Weeks    Status  New    Target Date  03/31/18      OT SHORT TERM GOAL #2   Title  Pt to be ind in HEP to decrease pain , increase ROM  an to show increase use of 2nd digit in L hand     Baseline  pain 8/10- and ROM MC 60, PIP 38 and DIP 45     Time  2    Period  Weeks    Status  New    Target Date  03/24/18        OT Long Term Goals - 03/10/18 1752      OT LONG  TERM GOAL #1   Title  Pt L 2nd digit AROM improve for pt to touch palm an able to grasp 2cm cylinder object without increase pain     Baseline  see flowsheet for ROM     Time  4    Period  Weeks    Status  New    Target Date  04/07/18      OT LONG TERM GOAL #2   Title  Pain in 2nd digit flexion improve to less than 2/10 with making fist    Baseline  pain 8/10 with use     Time  4    Period  Weeks    Status  New    Target Date  04/07/18      OT LONG TERM GOAL #3   Title  Function score on PRWHE improve with more than 15 points     Baseline  function score on PRWHE at eval 20/50    Time  4    Period  Weeks    Status  New    Target Date  04/07/18            Plan - 04/06/18 1646    Clinical Impression Statement  Pt making progress in AROM and PROM slow but steady in L 2nd digit PIP- this date come in 70 degrees flexion and in session got 80 AROM and PROM 85     Occupational performance deficits (Please refer to evaluation for details):  ADL's;IADL's;Play;Leisure    Rehab Potential  Fair    Current Impairments/barriers affecting progress:  injury was 6 months ago - tender over A1pulley of 2nd - but no triggering     OT Frequency  2x / week    OT Duration  2 weeks    OT Treatment/Interventions  Self-care/ADL training;Iontophoresis;Therapeutic exercise;Patient/family education;Splinting;Paraffin;Fluidtherapy;Contrast Bath;Ultrasound;Manual Therapy;Passive range of motion    Plan  assess progress with HEP and upgrade - assess how did with putty     Clinical Decision Making  Several treatment options, min-mod task modification necessary    OT Home Exercise Plan  see pt instruction    Consulted and Agree with Plan of Care  Patient       Patient will benefit from skilled therapeutic intervention in order to improve the following deficits and impairments:  Pain, Impaired flexibility, Increased edema, Decreased strength, Decreased range of motion, Impaired UE functional use  Visit  Diagnosis: Pain in left finger(s)  Stiffness of left hand, not elsewhere classified  Localized edema  Muscle weakness (generalized)    Problem List Patient Active Problem List   Diagnosis Date Noted  . Hyperlipidemia due to type 2 diabetes mellitus ([place]) 11/25/2016  . Abnormal brain MRI 02/17/2016  . Nausea 05/02/2015  . GERD (gastroesophageal reflux disease) 04/24/2015  . Dizziness 04/24/2015  . Otitis externa 12/21/2014  . Allergic rhinitis 07/18/2014  . Colon polyp 07/18/2014  . Acid reflux 07/18/2014  . Hypercholesteremia 07/18/2014  . Low serum cobalamin 07/18/2014  . Adult BMI 30+ 07/18/2014  . Avitaminosis D 07/18/2014  . Diabetes mellitus, type 2 ([place]) 09/09/1998  . AG (atrophic gastritis) 03/06/1998  . HTN (hypertension) 06/04/1994    Rosalyn Gess OTR/L,CLT 04/06/2018, 4:50 PM  [place] [facility] [address] [place], [address] Phone: [phone]   Fax:  [phone]  Name: CHALEE HIROTA MRN: [id number] Date of Birth: 1941/03/03

## 2018-04-08 ENCOUNTER — Ambulatory Visit: Payer: Medicare PPO | Admitting: Occupational Therapy

## 2018-04-08 DIAGNOSIS — M79645 Pain in left finger(s): Secondary | ICD-10-CM

## 2018-04-08 DIAGNOSIS — M6281 Muscle weakness (generalized): Secondary | ICD-10-CM

## 2018-04-08 DIAGNOSIS — M25642 Stiffness of left hand, not elsewhere classified: Secondary | ICD-10-CM

## 2018-04-08 DIAGNOSIS — R600 Localized edema: Secondary | ICD-10-CM

## 2018-04-08 NOTE — Therapy (Signed)
[place] [facility] [address] [place], [address] Phone: [phone]   Fax:  [phone]  Occupational Therapy Treatment  Patient Details  Name: Kathleen Campos MRN: [id number] Date of Birth: [birth year] Referring Provider (OT): Candelaria Stagers   Encounter Date: 04/08/2018  OT End of Session - 04/08/18 1335    Visit Number  8    Number of Visits  16    Date for OT Re-Evaluation  05/06/18    OT Start Time  1315    OT Stop Time  1430    OT Time Calculation (min)  75 min    Activity Tolerance  Patient tolerated treatment well    Behavior During Therapy  [facility] for tasks assessed/performed       Past Medical History:  Diagnosis Date  . Diabetes mellitus without complication ([place])   . GERD (gastroesophageal reflux disease)   . Hyperlipidemia   . Hypertension   . Stroke [facility]) 2018   TIA    Past Surgical History:  Procedure Laterality Date  . BACK SURGERY  2003  . CERVICAL BIOPSY  W/ LOOP ELECTRODE EXCISION  2010  . CHOLECYSTECTOMY  1986  . COLONOSCOPY WITH PROPOFOL N/A 11/05/2016   Procedure: COLONOSCOPY WITH PROPOFOL;  Surgeon: Jonathon Bellows, MD;  Location[facility] END[facility]COPY;  Service: Gastroenterology;  Laterality: N/A;    There were no vitals filed for this visit.  Subjective Assessment - 04/08/18 1332    Subjective   I have appt with Dr Candelaria Stagers on Tuesday - it is still stiff but I can tell it is better and  I am using it more     Patient Stated Goals  I would like the pain , motion and use better in my finger and not stick it up in the air     Currently in Pain?  Yes    Pain Score  --   4-8/10    Pain Location  Finger (Comment which one)    Pain Orientation  Left    Pain Descriptors / Indicators  Aching;Tightness;Shooting    Pain Type  Acute pain;Chronic pain    Pain Onset  More than a month ago    Aggravating Factors   With ROM - and stretches          [facility] OT Assessment - 04/08/18 0001       Strength   Right Hand Grip (lbs)  60    Right Hand Lateral Pinch  16 lbs    Right Hand 3 Point Pinch  17 lbs    Left Hand Grip (lbs)  35    Left Hand Lateral Pinch  13 lbs    Left Hand 3 Point Pinch  13 lbs      Left Hand AROM   L Index  MCP 0-90  65 Degrees    L Index PIP 0-100  75 Degrees   70-80 in session and PROM 80 - pain 8/10        progress in 3 point pinch grip - and report increase functional use with no pain  Only pain with ROM - 4-8/10 Cont tender over A1pulley -2nd digit but better than it was    soft tissue mobs done to lateral bands of PIP and DIP of 2nd digit And over volar 2nd digitdonegraston tool nr 2 brushing prior to ROM Cont to be Tender over A1 pulley of 2nd   PROM of DIP and PIP 2nd digit Prolonged flexion stretch done  to DIP , PIP and then composite flexionattempt2nd digit -  No pain to 70 at PIP AROM and PROM  Pain increase with AROM to 75  and PROM 80 to 85 degrees  Cont at homeDIP/PIP strap to wear 1 min x 3 -   AROM blocked DIP and PIP 2nd digit  10 repseach tendonglides- AROM - can use buddy strap to proximal then middle phalanges with tendon glides - focus on MC flexion first , then claw , then composite to 2 cm foam block  10 reps  Review sequence of HEP - and use ofDIP/PIP flexion strap cont with light blue putty for gripping -pain should not be more than 4/10 - and stick with 15 reps - 3 x day  Was able to doPIP flexion to about 75 degrees- into putty  Did fabricate MC block splint to use inbetween exercises with functional tasks to encourage PIP flexion       OT Treatments/Exercises (OP) - 04/08/18 0001      Iontophoresis   Type of Iontophoresis  Dexamethasone    Location  2nd A1pulley     Dose  small patch 1.6 intensity     Time  24      LUE Paraffin   Number Minutes Paraffin  10 Minutes    LUE Paraffin Location  Hand    Comments  coban flexion wrap to 2nd digit composite flexion               OT Education - 04/08/18 1335    Education Details  reinforce composite fist to 2cm foam block - and light blue putty - but not over do - keep pain under 4/10     Person(s) Educated  Patient    Methods  Explanation;Demonstration;Handout    Comprehension  Verbalized understanding;Returned demonstration       OT Short Term Goals - 03/10/18 1743      OT SHORT TERM GOAL #1   Title  Pain on PRWHE improve with more than 20 points     Baseline  PRWHE at eval for pain 39/50    Time  3    Period  Weeks    Status  New    Target Date  03/31/18      OT SHORT TERM GOAL #2   Title  Pt to be ind in HEP to decrease pain , increase ROM  an to show increase use of 2nd digit in L hand     Baseline  pain 8/10- and ROM MC 60, PIP 38 and DIP 45     Time  2    Period  Weeks    Status  New    Target Date  03/24/18        OT Long Term Goals - 03/10/18 1752      OT LONG TERM GOAL #1   Title  Pt L 2nd digit AROM improve for pt to touch palm an able to grasp 2cm cylinder object without increase pain     Baseline  see flowsheet for ROM     Time  4    Period  Weeks    Status  New    Target Date  04/07/18      OT LONG TERM GOAL #2   Title  Pain in 2nd digit flexion improve to less than 2/10 with making fist    Baseline  pain 8/10 with use     Time  4    Period  Weeks  Status  New    Target Date  04/07/18      OT LONG TERM GOAL #3   Title  Function score on PRWHE improve with more than 15 points     Baseline  function score on PRWHE at eval 20/50    Time  4    Period  Weeks    Status  New    Target Date  04/07/18            Plan - 04/08/18 1335    Clinical Impression Statement  Pt making steady but slow progress at PIP flexion - MC about the same 65 degrees and tender over A1pulley - but no triggering - stiffness has been in 2nd digit since June - did fabricate MC block splint to increase PIP flexion use inbetween HEP - but only wear with act - so that [facility] will not  get more stiff      Occupational performance deficits (Please refer to evaluation for details):  ADL's;IADL's;Play;Leisure    Rehab Potential  Fair    Current Impairments/barriers affecting progress:  injury was 6 months ago - tender over A1pulley of 2nd - but no triggering     OT Frequency  2x / week    OT Duration  4 weeks    OT Treatment/Interventions  Self-care/ADL training;Iontophoresis;Therapeutic exercise;Patient/family education;Splinting;Paraffin;Fluidtherapy;Contrast Bath;Ultrasound;Manual Therapy;Passive range of motion    Plan  assess progress - PIP and MC     Clinical Decision Making  Several treatment options, min-mod task modification necessary    OT Home Exercise Plan  see pt instruction    Consulted and Agree with Plan of Care  Patient       Patient will benefit from skilled therapeutic intervention in order to improve the following deficits and impairments:  Pain, Impaired flexibility, Increased edema, Decreased strength, Decreased range of motion, Impaired UE functional use  Visit Diagnosis: Pain in left finger(s) - Plan: Ot plan of care cert/re-cert  Stiffness of left hand, not elsewhere classified - Plan: Ot plan of care cert/re-cert  Localized edema - Plan: Ot plan of care cert/re-cert  Muscle weakness (generalized) - Plan: Ot plan of care cert/re-cert    Problem List Patient Active Problem List   Diagnosis Date Noted  . Hyperlipidemia due to type 2 diabetes mellitus ([place]) 11/25/2016  . Abnormal brain MRI 02/17/2016  . Nausea 05/02/2015  . GERD (gastroesophageal reflux disease) 04/24/2015  . Dizziness 04/24/2015  . Otitis externa 12/21/2014  . Allergic rhinitis 07/18/2014  . Colon polyp 07/18/2014  . Acid reflux 07/18/2014  . Hypercholesteremia 07/18/2014  . Low serum cobalamin 07/18/2014  . Adult BMI 30+ 07/18/2014  . Avitaminosis D 07/18/2014  . Diabetes mellitus, type 2 ([place]) 09/09/1998  . AG (atrophic gastritis) 03/06/1998  . HTN (hypertension)  06/04/1994    Rosalyn Gess OTR/L,CLT 04/08/2018, 2:43 PM  [place] [facility] [address] [place], [address] Phone: [phone]   Fax:  [phone]  Name: EUSTOLIA DRENNEN MRN: [id number] Date of Birth: [birth year]

## 2018-04-11 ENCOUNTER — Encounter: Payer: Medicare PPO | Admitting: Occupational Therapy

## 2018-04-11 ENCOUNTER — Ambulatory Visit: Payer: Medicare PPO | Attending: Sports Medicine | Admitting: Occupational Therapy

## 2018-04-11 DIAGNOSIS — M6281 Muscle weakness (generalized): Secondary | ICD-10-CM | POA: Insufficient documentation

## 2018-04-11 DIAGNOSIS — M79645 Pain in left finger(s): Secondary | ICD-10-CM | POA: Insufficient documentation

## 2018-04-11 DIAGNOSIS — M25642 Stiffness of left hand, not elsewhere classified: Secondary | ICD-10-CM | POA: Diagnosis present

## 2018-04-11 DIAGNOSIS — R600 Localized edema: Secondary | ICD-10-CM | POA: Diagnosis present

## 2018-04-11 NOTE — Therapy (Signed)
[place] [facility] [address] [place], [address] Phone: [phone]   Fax:  [phone]  Occupational Therapy Treatment  Patient Details  Name: Kathleen Campos MRN: [id number] Date of Birth: 08-25-1940 Referring Provider (OT): Candelaria Stagers   Encounter Date: 04/11/2018  OT End of Session - 04/11/18 1543    Visit Number  9    Number of Visits  16    Date for OT Re-Evaluation  05/06/18    OT Start Time  1448    OT Stop Time  1539    OT Time Calculation (min)  51 min    Activity Tolerance  Patient tolerated treatment well;Patient limited by pain    Behavior During Therapy  [facility] for tasks assessed/performed       Past Medical History:  Diagnosis Date  . Diabetes mellitus without complication ([place])   . GERD (gastroesophageal reflux disease)   . Hyperlipidemia   . Hypertension   . Stroke [facility]) 2018   TIA    Past Surgical History:  Procedure Laterality Date  . BACK SURGERY  2003  . CERVICAL BIOPSY  W/ LOOP ELECTRODE EXCISION  2010  . CHOLECYSTECTOMY  1986  . COLONOSCOPY WITH PROPOFOL N/A 11/05/2016   Procedure: COLONOSCOPY WITH PROPOFOL;  Surgeon: Jonathon Bellows, MD;  Location[facility] END[facility]COPY;  Service: Gastroenterology;  Laterality: N/A;    There were no vitals filed for this visit.  Subjective Assessment - 04/11/18 1540    Subjective   I have little more swelling  and stiffness in my finger since last time - I did push it some what and worked it     Patient Stated Goals  I would like the pain , motion and use better in my finger and not stick it up in the air     Currently in Pain?  Yes    Pain Score  5     Pain Location  Finger (Comment which one)    Pain Orientation  Left    Pain Descriptors / Indicators  Tightness;Shooting;Aching    Pain Type  Acute pain;Chronic pain    Pain Onset  More than a month ago    Aggravating Factors   with PROM and stretches - tender over A1pulley of 2nd          [facility] OT  Assessment - 04/11/18 0001      Left Hand AROM   L Index  MCP 0-90  65 Degrees    L Index PIP 0-100  --   70 end if session AROM    L Index DIP 0-70  50 Degrees       assess progress - pt increase edema and stiffness since last seen-  Pt report she had been pushing fingers flexion more - but in OT we also did more stretches and PROM since the last 2 wks         skin check done prior to ionto and pt to keep patch on for hour  OT Treatments/Exercises (OP) - 04/11/18 0001      Iontophoresis   Type of Iontophoresis  Dexamethasone    Location  2nd A1pulley     Dose  small patch 1.6 intensity     Time  24      LUE Contrast Bath   Time  11 minutes    Comments  decrease edema and pain at [facility]       done contrast to decrease edema and increase AROM  Done graston tool nr 2 for brushing to volar 2nd digit - and dorsal -as well as joint mobs to PIP and MC of 2nd  Did provide pt with new compression sleeve for 2nd digit  Was able to get AROM 70 at PIP - but still tender over A1pulley this date  Pt to see MD Wednesday         OT Education - 04/11/18 1542    Education Details  to hold off on stretches and PROM - maintain progress     Person(s) Educated  Patient    Methods  Explanation;Demonstration;Handout    Comprehension  Verbalized understanding;Returned demonstration       OT Short Term Goals - 04/11/18 1550      OT SHORT TERM GOAL #1   Title  Pain on PRWHE improve with more than 20 points     Baseline  PRWHE at eval for pain 39/50 - progressing     Time  3    Period  Weeks    Status  On-going    Target Date  05/02/18      OT SHORT TERM GOAL #2   Title  Pt to be ind in HEP to decrease pain , increase ROM  an to show increase use of 2nd digit in L hand     Baseline  pain at the worse 5/10 - flexoin MC 65, PIP 70-75 and report using it more     Status  Achieved        OT Long Term Goals - 04/11/18 1551      OT LONG TERM GOAL #1   Title  Pt L 2nd digit AROM  improve for pt to touch palm an able to grasp 2cm cylinder object without increase pain     Baseline  still decrease and not able to grasp 2 inch object - but progressing     Time  4    Period  Weeks    Status  On-going    Target Date  05/09/18      OT LONG TERM GOAL #2   Title  Pain in 2nd digit flexion improve to less than 2/10 with making fist    Baseline  pain 8/10 with use at eval -and no pain with use - but at the worse - PROM 5-8/10     Time  3    Period  Weeks    Status  On-going    Target Date  05/02/18      OT LONG TERM GOAL #3   Title  Function score on PRWHE improve with more than 15 points     Baseline  function score on PRWHE at eval 20/50- progressing     Time  3    Period  Weeks    Status  On-going    Target Date  05/02/18            Plan - 04/11/18 1544    Clinical Impression Statement  Pt was seen for 9 sessions - pt show slow but steady progress-  in PIP and 3 point grip - PIP flexion increase from 38 to 70-75 degrees AROM , and PROM 85  but MC only 60 to 65 progress -prehension increase from 7 lbs to 13 lbs -  pt still tender over A1pulley of 2nd but no triggering - did push more  PROM and stretches the last week and 1/2 - pt this date with increase edema and stiffness but could still get AROM  of 70 -pt to see MD - ? shot or intervenstion to decrease inlammation to be able to tolerate stretches     Occupational performance deficits (Please refer to evaluation for details):  ADL's;IADL's;Play;Leisure    Rehab Potential  Fair    Current Impairments/barriers affecting progress:  injury was 6 months ago - tender over A1pulley of 2nd - but no triggering     OT Frequency  2x / week    OT Duration  4 weeks    OT Treatment/Interventions  Self-care/ADL training;Iontophoresis;Therapeutic exercise;Patient/family education;Splinting;Paraffin;Fluidtherapy;Contrast Bath;Ultrasound;Manual Therapy;Passive range of motion    Plan  pt to see MD - ? shot or change in POC      Clinical Decision Making  Several treatment options, min-mod task modification necessary    OT Home Exercise Plan  see pt instruction    Consulted and Agree with Plan of Care  Patient       Patient will benefit from skilled therapeutic intervention in order to improve the following deficits and impairments:  Pain, Impaired flexibility, Increased edema, Decreased strength, Decreased range of motion, Impaired UE functional use  Visit Diagnosis: Pain in left finger(s)  Stiffness of left hand, not elsewhere classified  Localized edema  Muscle weakness (generalized)    Problem List Patient Active Problem List   Diagnosis Date Noted  . Hyperlipidemia due to type 2 diabetes mellitus ([place]) 11/25/2016  . Abnormal brain MRI 02/17/2016  . Nausea 05/02/2015  . GERD (gastroesophageal reflux disease) 04/24/2015  . Dizziness 04/24/2015  . Otitis externa 12/21/2014  . Allergic rhinitis 07/18/2014  . Colon polyp 07/18/2014  . Acid reflux 07/18/2014  . Hypercholesteremia 07/18/2014  . Low serum cobalamin 07/18/2014  . Adult BMI 30+ 07/18/2014  . Avitaminosis D 07/18/2014  . Diabetes mellitus, type 2 ([place]) 09/09/1998  . AG (atrophic gastritis) 03/06/1998  . HTN (hypertension) 06/04/1994    Rosalyn Gess OTR/L,CLT 04/11/2018, 3:54 PM  [place] [facility] [address] [place], [address] Phone: [phone]   Fax:  [phone]  Name: Kathleen Campos MRN: [id number] Date of Birth: 06/06/1940

## 2018-04-11 NOTE — Patient Instructions (Signed)
Cont with HEP but do not push  Stretches and PROM at much - maintain what we gained

## 2018-04-15 ENCOUNTER — Ambulatory Visit: Payer: Medicare PPO | Admitting: Occupational Therapy

## 2018-04-20 ENCOUNTER — Ambulatory Visit: Payer: Medicare PPO | Admitting: Occupational Therapy

## 2018-04-20 DIAGNOSIS — M79645 Pain in left finger(s): Secondary | ICD-10-CM

## 2018-04-20 DIAGNOSIS — R600 Localized edema: Secondary | ICD-10-CM

## 2018-04-20 DIAGNOSIS — M6281 Muscle weakness (generalized): Secondary | ICD-10-CM

## 2018-04-20 DIAGNOSIS — M25642 Stiffness of left hand, not elsewhere classified: Secondary | ICD-10-CM

## 2018-04-20 NOTE — Therapy (Signed)
[place] [facility] [address] [place], [address] Phone: [phone]   Fax:  [phone]  Occupational Therapy Treatment/progress note   Patient Details  Name: Kathleen Campos MRN: [id number] Date of Birth: 02/22/1941 Referring Provider (OT): Candelaria Stagers   Encounter Date: 04/20/2018  OT End of Session - 04/20/18 1258    Visit Number  10    Number of Visits  18    Date for OT Re-Evaluation  05/18/18    OT Start Time  1247    OT Stop Time  1317    OT Time Calculation (min)  30 min    Activity Tolerance  Patient tolerated treatment well;Patient limited by pain    Behavior During Therapy  [facility] for tasks assessed/performed       Past Medical History:  Diagnosis Date  . Diabetes mellitus without complication ([place])   . GERD (gastroesophageal reflux disease)   . Hyperlipidemia   . Hypertension   . Stroke [facility]) 2018   TIA    Past Surgical History:  Procedure Laterality Date  . BACK SURGERY  2003  . CERVICAL BIOPSY  W/ LOOP ELECTRODE EXCISION  2010  . CHOLECYSTECTOMY  1986  . COLONOSCOPY WITH PROPOFOL N/A 11/05/2016   Procedure: COLONOSCOPY WITH PROPOFOL;  Surgeon: Jonathon Bellows, MD;  Location: [facility] ENDOSCOPY;  Service: Gastroenterology;  Laterality: N/A;    There were no vitals filed for this visit.  Subjective Assessment - 04/20/18 1256    Subjective   Dr Candelaria Stagers gave me a shot in my palm at the base of my pinkie - that feels better - not as tender but still stiff     Patient Stated Goals  I would like the pain , motion and use better in my finger and not stick it up in the air     Currently in Pain?  No/denies         [facility] OT Assessment - 04/20/18 0001      Left Hand AROM   L Index  MCP 0-90  68 Degrees    L Index PIP 0-100  70 Degrees   PROM at [facility] 80   L Index DIP 0-70  50 Degrees       Less tenderness since shot last week -at A1pulley of the 2nd digit L hand  Still stiff - had pt do PRWHE -  pt still wants to use the middle finger when doing buttons and pick up objects - but can use 2nd digit and without pain - pt to pay attention more to that this week  PRWHE pain now only 11/50 - and mostly with PROM or stretches  And function score 4.5/50        OT Treatments/Exercises (OP) - 04/20/18 0001      LUE Paraffin   Number Minutes Paraffin  8 Minutes    LUE Paraffin Location  Hand    Comments  coban wrap done for composite flexion 2nd        Done graston tool nr 2 for brushing to volar 2nd digit - and dorsal -as well as joint mobs to PIP and MC of 2nd - and vibration over dorsal 2nd digit - where she feels the pain with PROM or stretches in flexion  Cont  compression sleeve for 2nd digit  Was able to get AROM 70 at [facility] this date -first time since therapy  Progress before was always at PIP - and PIP was 80  Add to HEP composite stretch using coban flexion strap to increase flexion 7-10 min prior to AROM full fist  To focus on keeping wrist straight  Cont with contrast and PROM prior       OT Education - 04/20/18 1258    Education Details  add composite flexion strap with coban to 2nd digit    Person(s) Educated  Patient    Methods  Explanation;Demonstration;Handout    Comprehension  Verbalized understanding;Returned demonstration       OT Short Term Goals - 04/20/18 1635      OT SHORT TERM GOAL #1   Title  Pain on PRWHE improve with more than 20 points     Baseline  PRWHE at eval for pain 39/50 - and now 11/50    Status  Achieved      OT SHORT TERM GOAL #2   Title  Pt to be ind in HEP to decrease pain , increase ROM  an to show increase use of 2nd digit in L hand     Baseline  Pain at rest or use no pain -only with PROM or stretching into flexion    Status  Achieved        OT Long Term Goals - 04/20/18 1636      OT LONG TERM GOAL #1   Title  Pt L 2nd digit AROM improve for pt to touch palm an able to grasp 2cm cylinder object without increase pain      Baseline  still decrease and not able to grasp 2 inch object - but progressing   MC flexion 70 this date and PIP 80 in session     Time  4    Period  Weeks    Status  On-going    Target Date  05/18/18      OT LONG TERM GOAL #2   Title  Pain in 2nd digit flexion improve to less than 2/10 with making fist    Baseline  pain 8/10 with use at eval -and  now no pain with use or AROM only with PROM increase to 5-8/10     Time  3    Period  Weeks    Status  On-going    Target Date  05/11/18      OT LONG TERM GOAL #3   Title  Function score on PRWHE improve with more than 15 points     Baseline  function score on PRWHE at eval 20/50-  and now 4.5/50    Status  Achieved            Plan - 04/20/18 1259    Clinical Impression Statement  Pt seen this date for 10 visits - pt report and show decrease pain over A1 pully at 2nd digit- had shot last week by Dr Candelaria Stagers - pt show AROM to 2nd Ruth 70, and PIP 80 this date - pain can still increase to 8/10 but show increase MC flexion first time since initiate therapy - add composite stretch to HEP using coban strap      Occupational performance deficits (Please refer to evaluation for details):  ADL's;IADL's;Play;Leisure    Rehab Potential  Fair    Current Impairments/barriers affecting progress:  injury was 6 months ago - tender over A1pulley of 2nd - but no triggering     OT Frequency  2x / week    OT Duration  4 weeks    OT Treatment/Interventions  Self-care/ADL training;Iontophoresis;Therapeutic exercise;Patient/family education;Splinting;Paraffin;Fluidtherapy;Contrast Bath;Ultrasound;Manual Therapy;Passive range  of motion    Plan  assess progress with composite flexion stretch at home with coban    Clinical Decision Making  Several treatment options, min-mod task modification necessary    OT Home Exercise Plan  see pt instruction    Consulted and Agree with Plan of Care  Patient       Patient will benefit from skilled therapeutic  intervention in order to improve the following deficits and impairments:  Pain, Impaired flexibility, Increased edema, Decreased strength, Decreased range of motion, Impaired UE functional use  Visit Diagnosis: Pain in left finger(s)  Stiffness of left hand, not elsewhere classified  Localized edema  Muscle weakness (generalized)    Problem List Patient Active Problem List   Diagnosis Date Noted  . Hyperlipidemia due to type 2 diabetes mellitus ([place]) 11/25/2016  . Abnormal brain MRI 02/17/2016  . Nausea 05/02/2015  . GERD (gastroesophageal reflux disease) 04/24/2015  . Dizziness 04/24/2015  . Otitis externa 12/21/2014  . Allergic rhinitis 07/18/2014  . Colon polyp 07/18/2014  . Acid reflux 07/18/2014  . Hypercholesteremia 07/18/2014  . Low serum cobalamin 07/18/2014  . Adult BMI 30+ 07/18/2014  . Avitaminosis D 07/18/2014  . Diabetes mellitus, type 2 ([place]) 09/09/1998  . AG (atrophic gastritis) 03/06/1998  . HTN (hypertension) 06/04/1994    Rosalyn Gess OTR/L,CLT 04/20/2018, 4:40 PM  [place] [facility] [address] [place], [address] Phone: [phone]   Fax:  [phone]  Name: RUMAISA SCHNETZER MRN: [id number] Date of Birth: 28-Mar-1940

## 2018-04-20 NOTE — Patient Instructions (Signed)
Add to HEP composite stretch using coban flexion strap to increase flexion 7-10 min prior to AROM full fist  To focus on keeping wrist straight  Cont with contrast and PROM prior

## 2018-04-26 ENCOUNTER — Ambulatory Visit: Payer: Medicare PPO | Admitting: Occupational Therapy

## 2018-04-26 ENCOUNTER — Other Ambulatory Visit: Payer: Self-pay | Admitting: Physician Assistant

## 2018-04-26 DIAGNOSIS — M79645 Pain in left finger(s): Secondary | ICD-10-CM

## 2018-04-26 DIAGNOSIS — M25642 Stiffness of left hand, not elsewhere classified: Secondary | ICD-10-CM

## 2018-04-26 DIAGNOSIS — I10 Essential (primary) hypertension: Secondary | ICD-10-CM

## 2018-04-26 DIAGNOSIS — H6983 Other specified disorders of Eustachian tube, bilateral: Secondary | ICD-10-CM

## 2018-04-26 DIAGNOSIS — R600 Localized edema: Secondary | ICD-10-CM

## 2018-04-26 DIAGNOSIS — M6281 Muscle weakness (generalized): Secondary | ICD-10-CM

## 2018-04-26 NOTE — Therapy (Signed)
[place] [facility] [address], [place], [address] Phone: [phone]   Fax:  [phone]  Occupational Therapy Treatment  Patient Details  Name: Kathleen Campos MRN: [id number] Date of Birth: 08-Aug-1940 Referring Provider (OT): Candelaria Stagers   Encounter Date: 04/26/2018  OT End of Session - 04/26/18 1433    Visit Number  11    Number of Visits  18    Date for OT Re-Evaluation  05/18/18    OT Start Time  1423    OT Stop Time  1455    OT Time Calculation (min)  32 min    Activity Tolerance  Patient tolerated treatment well;Patient limited by pain    Behavior During Therapy  [facility] for tasks assessed/performed       Past Medical History:  Diagnosis Date  . Diabetes mellitus without complication ([place])   . GERD (gastroesophageal reflux disease)   . Hyperlipidemia   . Hypertension   . Stroke [facility]) 2018   TIA    Past Surgical History:  Procedure Laterality Date  . BACK SURGERY  2003  . CERVICAL BIOPSY  W/ LOOP ELECTRODE EXCISION  2010  . CHOLECYSTECTOMY  1986  . COLONOSCOPY WITH PROPOFOL N/A 11/05/2016   Procedure: COLONOSCOPY WITH PROPOFOL;  Surgeon: Jonathon Bellows, MD;  Location: [facility] ENDOSCOPY;  Service: Gastroenterology;  Laterality: N/A;    There were no vitals filed for this visit.  Subjective Assessment - 04/26/18 1429    Subjective   Using  my finger more since last time like with buttons and squeezing washcloth and gripping steering wheel - not sticking my index finger up in air    Patient Stated Goals  I would like the pain , motion and use better in my finger and not stick it up in the air     Currently in Pain?  Yes    Pain Score  3     Pain Location  Finger (Comment which one)    Pain Orientation  Left    Pain Descriptors / Indicators  Aching;Tightness;Shooting    Pain Type  Acute pain;Chronic pain    Aggravating Factors   with AROM - increase to 6/10 with PROM          [facility] OT Assessment -  04/26/18 0001      Left Hand AROM   L Index  MCP 0-90  66 Degrees   70 AROM    L Index PIP 0-100  75 Degrees   85 PROM         had little tenderness - but not as before shot on A1pulley of 2nd       OT Treatments/Exercises (OP) - 04/26/18 0001      Ultrasound   Ultrasound Location  2nd A1pulley     Ultrasound Parameters  3.3MHZ, 20 % , 0.8    Ultrasound Goals  Pain      LUE Paraffin   Number Minutes Paraffin  8 Minutes    LUE Paraffin Location  Hand    Comments  coban wrap -flexion wrap to 2nd digit         Done graston tool nr 2 for brushing to volar 2nd digit - and dorsal -as well as joint mobs to PIP and MC of 2nd - PROM to DIP, PIP and composite  And prolonged stretch into flexion -sitting holding in fist - can do at home   Was able to get AROM 76 MC in session -first  time since therapy   PIP was 80 -85 AROM  Use done incase irritated  A1pulley    cont HEP composite stretch using coban flexion strap to increase flexion 7-10 min prior to AROM full fist - orsitting with hand in fist - palm down  To focus on keeping wrist straight  Cont with contrast and PROM prior  And USING her 2nd digit        OT Education - 04/26/18 1433    Education Details  progress and HEP     Person(s) Educated  Patient    Methods  Explanation;Demonstration;Handout    Comprehension  Verbalized understanding;Returned demonstration       OT Short Term Goals - 04/20/18 1635      OT SHORT TERM GOAL #1   Title  Pain on PRWHE improve with more than 20 points     Baseline  PRWHE at eval for pain 39/50 - and now 11/50    Status  Achieved      OT SHORT TERM GOAL #2   Title  Pt to be ind in HEP to decrease pain , increase ROM  an to show increase use of 2nd digit in L hand     Baseline  Pain at rest or use no pain -only with PROM or stretching into flexion    Status  Achieved        OT Long Term Goals - 04/20/18 1636      OT LONG TERM GOAL #1   Title  Pt L 2nd digit AROM  improve for pt to touch palm an able to grasp 2cm cylinder object without increase pain     Baseline  still decrease and not able to grasp 2 inch object - but progressing   MC flexion 70 this date and PIP 80 in session     Time  4    Period  Weeks    Status  On-going    Target Date  05/18/18      OT LONG TERM GOAL #2   Title  Pain in 2nd digit flexion improve to less than 2/10 with making fist    Baseline  pain 8/10 with use at eval -and  now no pain with use or AROM only with PROM increase to 5-8/10     Time  3    Period  Weeks    Status  On-going    Target Date  05/11/18      OT LONG TERM GOAL #3   Title  Function score on PRWHE improve with more than 15 points     Baseline  function score on PRWHE at eval 20/50-  and now 4.5/50    Status  Achieved            Plan - 04/26/18 1434    Clinical Impression Statement  Pt making good progress since she had her shot at 2nd A1pulley 2 wks ago - and reporting using it more - less pain and progressing with AROM at [facility] and PIP - PIP in session this date 83-85 and MC 75     Occupational performance deficits (Please refer to evaluation for details):  ADL's;IADL's;Play;Leisure    Rehab Potential  Fair    Current Impairments/barriers affecting progress:  injury was 6 months ago - tender over A1pulley of 2nd - but no triggering     OT Frequency  1x / week    OT Duration  4 weeks    OT Treatment/Interventions  Self-care/ADL training;Iontophoresis;Therapeutic  exercise;Patient/family education;Splinting;Paraffin;Fluidtherapy;Contrast Bath;Ultrasound;Manual Therapy;Passive range of motion    Plan  assess progress with composite flexion stretch at home with coban    Clinical Decision Making  Several treatment options, min-mod task modification necessary    OT Home Exercise Plan  see pt instruction    Consulted and Agree with Plan of Care  Patient       Patient will benefit from skilled therapeutic intervention in order to improve the following  deficits and impairments:  Pain, Impaired flexibility, Increased edema, Decreased strength, Decreased range of motion, Impaired UE functional use  Visit Diagnosis: Pain in left finger(s)  Stiffness of left hand, not elsewhere classified  Localized edema  Muscle weakness (generalized)    Problem List Patient Active Problem List   Diagnosis Date Noted  . Hyperlipidemia due to type 2 diabetes mellitus ([place]) 11/25/2016  . Abnormal brain MRI 02/17/2016  . Nausea 05/02/2015  . GERD (gastroesophageal reflux disease) 04/24/2015  . Dizziness 04/24/2015  . Otitis externa 12/21/2014  . Allergic rhinitis 07/18/2014  . Colon polyp 07/18/2014  . Acid reflux 07/18/2014  . Hypercholesteremia 07/18/2014  . Low serum cobalamin 07/18/2014  . Adult BMI 30+ 07/18/2014  . Avitaminosis D 07/18/2014  . Diabetes mellitus, type 2 ([place]) 09/09/1998  . AG (atrophic gastritis) 03/06/1998  . HTN (hypertension) 06/04/1994    Rosalyn Gess OTR/L,CLT 04/26/2018, 2:59 PM  [place] [facility] [address], [place], [address] Phone: [phone]   Fax:  [phone]  Name: Kathleen Campos MRN: [id number] Date of Birth: June 25, 1940

## 2018-04-26 NOTE — Patient Instructions (Addendum)
Same - sit and watch tv and keep hand in fist - for stretch

## 2018-04-28 ENCOUNTER — Ambulatory Visit: Payer: Medicare PPO | Admitting: Occupational Therapy

## 2018-04-28 DIAGNOSIS — M79645 Pain in left finger(s): Secondary | ICD-10-CM | POA: Diagnosis not present

## 2018-04-28 DIAGNOSIS — M6281 Muscle weakness (generalized): Secondary | ICD-10-CM

## 2018-04-28 DIAGNOSIS — R600 Localized edema: Secondary | ICD-10-CM

## 2018-04-28 DIAGNOSIS — M25642 Stiffness of left hand, not elsewhere classified: Secondary | ICD-10-CM

## 2018-04-28 NOTE — Therapy (Signed)
[place] [facility] [address] [place], [address] Phone: [phone]   Fax:  [phone]  Occupational Therapy Treatment  Patient Details  Name: Kathleen Campos MRN: [id number] Date of Birth: 09-21-1940 Referring Provider (OT): Candelaria Stagers   Encounter Date: 04/28/2018  OT End of Session - 04/28/18 1358    Visit Number  12    Number of Visits  18    Date for OT Re-Evaluation  05/18/18    OT Start Time  1310    OT Stop Time  1350    OT Time Calculation (min)  40 min    Activity Tolerance  Patient tolerated treatment well;Patient limited by pain    Behavior During Therapy  [facility] for tasks assessed/performed       Past Medical History:  Diagnosis Date  . Diabetes mellitus without complication ([place])   . GERD (gastroesophageal reflux disease)   . Hyperlipidemia   . Hypertension   . Stroke [facility]) 2018   TIA    Past Surgical History:  Procedure Laterality Date  . BACK SURGERY  2003  . CERVICAL BIOPSY  W/ LOOP ELECTRODE EXCISION  2010  . CHOLECYSTECTOMY  1986  . COLONOSCOPY WITH PROPOFOL N/A 11/05/2016   Procedure: COLONOSCOPY WITH PROPOFOL;  Surgeon: Jonathon Bellows, MD;  Location: [facility] ENDOSCOPY;  Service: Gastroenterology;  Laterality: N/A;    There were no vitals filed for this visit.  Subjective Assessment - 04/28/18 1354    Subjective   Doing better - using it more to drive , cut with food, hold like  1/2 gallon of milk - still hard to open jars     Patient Stated Goals  I would like the pain , motion and use better in my finger and not stick it up in the air     Currently in Pain?  No/denies   7 with AROM flexion of 2nd digit        [facility] OT Assessment - 04/28/18 0001      Left Hand AROM   L Index  MCP 0-90  70 Degrees   75 in session   L Index PIP 0-100  75 Degrees   80-85 in session AROM      making great progress - and easier AROM this date in 2nd digit- and increase use per pt           OT Treatments/Exercises (OP) - 04/28/18 0001      Ultrasound   Ultrasound Location  2nd A1pulley     Ultrasound Parameters  3.3Mhz, 20 %, 0.8 intensity     Ultrasound Goals  Pain      LUE Paraffin   Number Minutes Paraffin  8 Minutes    LUE Paraffin Location  Hand    Comments  Coban flexion wrap to 2nd digit         Done graston tool nr 2 for brushing to volar 2nd digit - and dorsal -as well as joint mobs to PIP and MC of 2nd- PROM to DIP, PIP and composite  And prolonged stretch into flexion -sitting holding in fist - can do at home   Was able to get AROM 51 MC with much more ease PIP was 80 to 85 AROM  Pt feel pull over dorsal 2nd digit- only little tender over A1pulley but no pain with fisting  Pt can do some rolling over putty - inbetween of fisting - if need to massage or stretch it  cont  HEP composite stretch using coban flexion strap to increase flexion 7-10 min prior to AROM full fist - or sitting with hand in fist - palm down  To focus on keeping wrist straight  Cont with contrast and PROM prior And USING her 2nd digit       OT Education - 04/28/18 1358    Education Details  progress and HEP     Person(s) Educated  Patient    Methods  Explanation;Demonstration;Handout    Comprehension  Verbalized understanding;Returned demonstration       OT Short Term Goals - 04/20/18 1635      OT SHORT TERM GOAL #1   Title  Pain on PRWHE improve with more than 20 points     Baseline  PRWHE at eval for pain 39/50 - and now 11/50    Status  Achieved      OT SHORT TERM GOAL #2   Title  Pt to be ind in HEP to decrease pain , increase ROM  an to show increase use of 2nd digit in L hand     Baseline  Pain at rest or use no pain -only with PROM or stretching into flexion    Status  Achieved        OT Long Term Goals - 04/20/18 1636      OT LONG TERM GOAL #1   Title  Pt L 2nd digit AROM improve for pt to touch palm an able to grasp 2cm cylinder  object without increase pain     Baseline  still decrease and not able to grasp 2 inch object - but progressing   MC flexion 70 this date and PIP 80 in session     Time  4    Period  Weeks    Status  On-going    Target Date  05/18/18      OT LONG TERM GOAL #2   Title  Pain in 2nd digit flexion improve to less than 2/10 with making fist    Baseline  pain 8/10 with use at eval -and  now no pain with use or AROM only with PROM increase to 5-8/10     Time  3    Period  Weeks    Status  On-going    Target Date  05/11/18      OT LONG TERM GOAL #3   Title  Function score on PRWHE improve with more than 15 points     Baseline  function score on PRWHE at eval 20/50-  and now 4.5/50    Status  Achieved            Plan - 04/28/18 1404    Clinical Impression Statement  Pt making great progress since she had her shot at 2nd A1pulley 2 wks ago - and reporting using it more - less pain and progressing with AROM at [facility] and PIP - PIP in session this date 71-85 and MC 75     Occupational performance deficits (Please refer to evaluation for details):  ADL's;IADL's;Play;Leisure    Rehab Potential  Fair    Current Impairments/barriers affecting progress:  injury was 6 months ago - tender over A1pulley of 2nd - but no triggering     OT Frequency  1x / week    OT Duration  4 weeks    OT Treatment/Interventions  Self-care/ADL training;Iontophoresis;Therapeutic exercise;Patient/family education;Splinting;Paraffin;Fluidtherapy;Contrast Bath;Ultrasound;Manual Therapy;Passive range of motion    Plan  assess progress with composite flexion stretch at home with coban  Clinical Decision Making  Several treatment options, min-mod task modification necessary    OT Home Exercise Plan  see pt instruction    Consulted and Agree with Plan of Care  Patient       Patient will benefit from skilled therapeutic intervention in order to improve the following deficits and impairments:  Pain, Impaired flexibility,  Increased edema, Decreased strength, Decreased range of motion, Impaired UE functional use  Visit Diagnosis: Pain in left finger(s)  Stiffness of left hand, not elsewhere classified  Localized edema  Muscle weakness (generalized)    Problem List Patient Active Problem List   Diagnosis Date Noted  . Hyperlipidemia due to type 2 diabetes mellitus ([place]) 11/25/2016  . Abnormal brain MRI 02/17/2016  . Nausea 05/02/2015  . GERD (gastroesophageal reflux disease) 04/24/2015  . Dizziness 04/24/2015  . Otitis externa 12/21/2014  . Allergic rhinitis 07/18/2014  . Colon polyp 07/18/2014  . Acid reflux 07/18/2014  . Hypercholesteremia 07/18/2014  . Low serum cobalamin 07/18/2014  . Adult BMI 30+ 07/18/2014  . Avitaminosis D 07/18/2014  . Diabetes mellitus, type 2 ([place]) 09/09/1998  . AG (atrophic gastritis) 03/06/1998  . HTN (hypertension) 06/04/1994    Rosalyn Gess OTR/L,CLT 04/28/2018, 2:11 PM  [place] [facility] [address], [place], [address] Phone: [phone]   Fax:  [phone]  Name: Kathleen Campos MRN: [id number] Date of Birth: 1940/04/19

## 2018-04-28 NOTE — Patient Instructions (Signed)
Same HEP - can do some gripping with 2nd into pink foam - and not putty  And can do some rolling over putty - inbetween  Fisting exercises

## 2018-05-03 ENCOUNTER — Encounter: Payer: Medicare PPO | Admitting: Occupational Therapy

## 2018-05-04 ENCOUNTER — Telehealth: Payer: Self-pay

## 2018-05-04 ENCOUNTER — Ambulatory Visit: Payer: Medicare PPO | Admitting: Occupational Therapy

## 2018-05-04 ENCOUNTER — Ambulatory Visit
Admission: RE | Admit: 2018-05-04 | Discharge: 2018-05-04 | Disposition: A | Discharge status: Home / Self Care | Payer: Medicare PPO | Source: Ambulatory Visit | Attending: Physician Assistant | Admitting: Physician Assistant

## 2018-05-04 DIAGNOSIS — M79645 Pain in left finger(s): Secondary | ICD-10-CM

## 2018-05-04 DIAGNOSIS — Z1231 Encounter for screening mammogram for malignant neoplasm of breast: Secondary | ICD-10-CM | POA: Insufficient documentation

## 2018-05-04 DIAGNOSIS — M6281 Muscle weakness (generalized): Secondary | ICD-10-CM

## 2018-05-04 DIAGNOSIS — Z1239 Encounter for other screening for malignant neoplasm of breast: Secondary | ICD-10-CM

## 2018-05-04 DIAGNOSIS — M25642 Stiffness of left hand, not elsewhere classified: Secondary | ICD-10-CM

## 2018-05-04 DIAGNOSIS — R600 Localized edema: Secondary | ICD-10-CM

## 2018-05-04 NOTE — Telephone Encounter (Signed)
Patient advised as directed below. 

## 2018-05-04 NOTE — Therapy (Signed)
[facility] [address], [place], [address] Phone: [phone]   Fax:  [phone]  Occupational Therapy Treatment  Patient Details  Name: Kathleen Campos MRN: [id number] Date of Birth: [birth year] Referring Provider (OT): Candelaria Stagers   Encounter Date: 05/04/2018  OT End of Session - 05/04/18 1251    Visit Number  13    Number of Visits  18    Date for OT Re-Evaluation  05/18/18    OT Start Time  5732    OT Stop Time  1310    OT Time Calculation (min)  35 min    Activity Tolerance  Patient tolerated treatment well;Patient limited by pain    Behavior During Therapy  [facility] for tasks assessed/performed       Past Medical History:  Diagnosis Date  . Diabetes mellitus without complication ([place])   . GERD (gastroesophageal reflux disease)   . Hyperlipidemia   . Hypertension   . Stroke [facility]) 2018   TIA    Past Surgical History:  Procedure Laterality Date  . BACK SURGERY  2003  . CERVICAL BIOPSY  W/ LOOP ELECTRODE EXCISION  2010  . CHOLECYSTECTOMY  1986  . COLONOSCOPY WITH PROPOFOL N/A 11/05/2016   Procedure: COLONOSCOPY WITH PROPOFOL;  Surgeon: Jonathon Bellows, MD;  Location: [facility] ENDOSCOPY;  Service: Gastroenterology;  Laterality: N/A;    There were no vitals filed for this visit.  Subjective Assessment - 05/04/18 1249    Subjective   I am using it more- little sore in the joint - but I can tell my motion is getting better- I can slide my index down my thumb to the fold - but in the morning it is stiff     Patient Stated Goals  I would like the pain , motion and use better in my finger and not stick it up in the air     Currently in Pain?  No/denies         [facility] OT Assessment - 05/04/18 0001      Strength   Right Hand Grip (lbs)  60    Right Hand Lateral Pinch  16 lbs    Right Hand 3 Point Pinch  17 lbs    Left Hand Grip (lbs)  35    Left Hand Lateral Pinch  14 lbs    Left Hand 3 Point Pinch  14  lbs      Left Hand AROM   L Index  MCP 0-90  75 Degrees   75-80 in session    L Index PIP 0-100  80 Degrees   85-90 in session               OT Treatments/Exercises (OP) - 05/04/18 0001      LUE Paraffin   Number Minutes Paraffin  8 Minutes    LUE Paraffin Location  Hand    Comments  Coban flexion wrap 2nd digit at [facility]          Done graston tool nr 2 for brushing to volar 2nd digit - and dorsal -as well as joint mobs to PIP and MC of 2nd-  vibration on dorsal and volar 2nd digit and A1pulley -  PROM to DIP, PIP and composite  And prolonged stretch into flexion -sitting holding in fist -  Cont at home  Was able to get AROM 75-80 MC with much more ease PIP was  85-90 AROM  Pt feel pull over dorsal 2nd  digit- only little tender over A1pulley but no pain with fisting  And some pain in PIP  Pt can do some rolling over putty - inbetween of fisting - if need to massage or stretch it Did add this date gripping putty again for grip and 3 point - 2 x 20 reps   contHEP composite stretch using coban flexion strap to increase flexion 7-10 min prior to AROM full fist- or sitting with hand in fist - palm down To focus on keeping wrist straight  Cont with contrast and PROM prior And USING her 2nd digit      OT Education - 05/04/18 1251    Education Details  progress and add putty again for grip     Person(s) Educated  Patient    Methods  Explanation;Demonstration;Handout    Comprehension  Verbalized understanding;Returned demonstration       OT Short Term Goals - 04/20/18 1635      OT SHORT TERM GOAL #1   Title  Pain on PRWHE improve with more than 20 points     Baseline  PRWHE at eval for pain 39/50 - and now 11/50    Status  Achieved      OT [place] #2   Title  Pt to be ind in HEP to decrease pain , increase ROM  an to show increase use of 2nd digit in L hand     Baseline  Pain at rest or use no pain -only with PROM or stretching into flexion     Status  Achieved        OT Long Term Goals - 04/20/18 1636      OT LONG TERM GOAL #1   Title  Pt L 2nd digit AROM improve for pt to touch palm an able to grasp 2cm cylinder object without increase pain     Baseline  still decrease and not able to grasp 2 inch object - but progressing   MC flexion 70 this date and PIP 80 in session     Time  4    Period  Weeks    Status  On-going    Target Date  05/18/18      OT LONG TERM GOAL #2   Title  Pain in 2nd digit flexion improve to less than 2/10 with making fist    Baseline  pain 8/10 with use at eval -and  now no pain with use or AROM only with PROM increase to 5-8/10     Time  3    Period  Weeks    Status  On-going    Target Date  05/11/18      OT LONG TERM GOAL #3   Title  Function score on PRWHE improve with more than 15 points     Baseline  function score on PRWHE at eval 20/50-  and now 4.5/50    Status  Achieved            Plan - 05/04/18 1251    Clinical Impression Statement  Pt cont to make steady progress in 2nd digit flexion at PIP and MC - tenderness still decrease over A1pulley - and MC flexion this date coming in 75  and PIP 80 - report increase use - grip was same did add putty again for grip - but to stop if increase pain or edema     Occupational performance deficits (Please refer to evaluation for details):  ADL's;IADL's;Play;Leisure    Rehab Potential  Fair  Current Impairments/barriers affecting progress:  injury was 6 months ago - tender over A1pulley of 2nd - but no triggering     OT Frequency  1x / week    OT Duration  4 weeks    OT Treatment/Interventions  Self-care/ADL training;Iontophoresis;Therapeutic exercise;Patient/family education;Splinting;Paraffin;Fluidtherapy;Contrast Bath;Ultrasound;Manual Therapy;Passive range of motion    Plan  assess progress with composite flexion stretch at home with coban and putty     Clinical Decision Making  Several treatment options, min-mod task modification  necessary    OT Home Exercise Plan  see pt instruction    Consulted and Agree with Plan of Care  Patient       Patient will benefit from skilled therapeutic intervention in order to improve the following deficits and impairments:  Pain, Impaired flexibility, Increased edema, Decreased strength, Decreased range of motion, Impaired UE functional use  Visit Diagnosis: Pain in left finger(s)  Stiffness of left hand, not elsewhere classified  Localized edema  Muscle weakness (generalized)    Problem List Patient Active Problem List   Diagnosis Date Noted  . Hyperlipidemia due to type 2 diabetes mellitus ([place]) 11/25/2016  . Abnormal brain MRI 02/17/2016  . Nausea 05/02/2015  . GERD (gastroesophageal reflux disease) 04/24/2015  . Dizziness 04/24/2015  . Otitis externa 12/21/2014  . Allergic rhinitis 07/18/2014  . Colon polyp 07/18/2014  . Acid reflux 07/18/2014  . Hypercholesteremia 07/18/2014  . Low serum cobalamin 07/18/2014  . Adult BMI 30+ 07/18/2014  . Avitaminosis D 07/18/2014  . Diabetes mellitus, type 2 ([place]) 09/09/1998  . AG (atrophic gastritis) 03/06/1998  . HTN (hypertension) 06/04/1994    Rosalyn Gess OTR/L,CLT 05/04/2018, 6:51 PM  [facility] [address] [place], [address] Phone: [phone]   Fax:  [phone]  Name: Kathleen Campos MRN: [id number] Date of Birth: 1941-01-18

## 2018-05-04 NOTE — Telephone Encounter (Signed)
-----   Message from Mar Daring, [place] sent at 05/04/2018  1:22 PM EST ----- Normal mammogram. Repeat screening in one year.

## 2018-05-04 NOTE — Patient Instructions (Signed)
Same but start back gripping putty and 3 point pinch 20 reps  few times day - but easy light blue

## 2018-05-05 ENCOUNTER — Ambulatory Visit: Payer: Medicare PPO | Admitting: Occupational Therapy

## 2018-05-12 ENCOUNTER — Ambulatory Visit: Payer: Medicare PPO | Attending: Sports Medicine | Admitting: Occupational Therapy

## 2018-05-12 DIAGNOSIS — M25642 Stiffness of left hand, not elsewhere classified: Secondary | ICD-10-CM

## 2018-05-12 DIAGNOSIS — R600 Localized edema: Secondary | ICD-10-CM | POA: Diagnosis present

## 2018-05-12 DIAGNOSIS — M6281 Muscle weakness (generalized): Secondary | ICD-10-CM | POA: Diagnosis present

## 2018-05-12 DIAGNOSIS — M79645 Pain in left finger(s): Secondary | ICD-10-CM | POA: Insufficient documentation

## 2018-05-12 NOTE — Patient Instructions (Signed)
Pt to hold off on putty  And only do her HEP in am  And then 5 x day do stretch for intrinsic fist 4 x 2-3 min  And then AROM for 2nd digit sliding down volar thumb - to assess progress and hold 3 x 1-2 min  And then just use 2nd digit

## 2018-05-12 NOTE — Therapy (Signed)
[facility] [address] [place], [address] Phone: [phone]   Fax:  [phone]  Occupational Therapy Treatment  Patient Details  Name: Kathleen Campos MRN: [id number] Date of Birth: [birth year] Referring Provider (OT): Candelaria Stagers   Encounter Date: 05/12/2018  OT End of Session - 05/12/18 1923    Visit Number  14    Number of Visits  18    Date for OT Re-Evaluation  05/18/18    OT Start Time  1402    OT Stop Time  1443    OT Time Calculation (min)  41 min    Activity Tolerance  Patient tolerated treatment well;Patient limited by pain    Behavior During Therapy  [facility] for tasks assessed/performed       Past Medical History:  Diagnosis Date  . Diabetes mellitus without complication ([place])   . GERD (gastroesophageal reflux disease)   . Hyperlipidemia   . Hypertension   . Stroke [facility]) 2018   TIA    Past Surgical History:  Procedure Laterality Date  . BACK SURGERY  2003  . CERVICAL BIOPSY  W/ LOOP ELECTRODE EXCISION  2010  . CHOLECYSTECTOMY  1986  . COLONOSCOPY WITH PROPOFOL N/A 11/05/2016   Procedure: COLONOSCOPY WITH PROPOFOL;  Surgeon: Jonathon Bellows, MD;  Location: [facility] ENDOSCOPY;  Service: Gastroenterology;  Laterality: N/A;    There were no vitals filed for this visit.  Subjective Assessment - 05/12/18 1918    Subjective   I am doing okay -still feel stiff -but I can pinch now with my thumb that I could not do before - but it is more sore - but I maybe over done it with the putty     Patient Stated Goals  I would like the pain , motion and use better in my finger and not stick it up in the air     Currently in Pain?  Yes    Pain Score  1     Pain Location  Finger (Comment which one)    Pain Orientation  Left    Pain Descriptors / Indicators  Sore    Pain Type  Acute pain    Pain Onset  More than a month ago    Pain Frequency  Intermittent         [facility] OT Assessment - 05/12/18 0001       Left Hand AROM   L Index  MCP 0-90  75 Degrees    L Index PIP 0-100  80 Degrees   90 in session       AROM same as last time - but still able to gain more PIP flexion   pt has more sorness this date  Her 3 point grip and lat grip increase to WNL -but grip decrease to 25 lbs         OT Treatments/Exercises (OP) - 05/12/18 0001      Ultrasound   Ultrasound Location  volar 2nd digit and A1pulley     Ultrasound Parameters  3.3MHZ, 20 % , 0.8 intensity     Ultrasound Goals  Edema      LUE Paraffin   Number Minutes Paraffin  8 Minutes    LUE Paraffin Location  Hand    Comments  coban flexion strap done to 2nd         Done graston tool nr 2 for brushing to volar 2nd digit - and dorsal -as well as joint mobs to  PIP and MC of 2nd-  vibration on dorsal and volar 2nd digit and A1pulley -  PROM to DIP, PIP and composite  And prolonged stretch in intrinsic fist - 4 x 2 min - pt to do 5 x day  But hold off on putty and doing whole HEP - only 1 x day in am   Was able to get AROM 75  PIP was 90 AROM And to intrinsic stretch 5 x day - 3 min - 5 x and then AROM sliding down 2nd on volar thumb - 5 x and hold 1 min   cont to use functionally 2nd digit     focus on decrease pain and soreness        OT Education - 05/12/18 1923    Education Details  hold off on putty -and do HEP only am and then stretch 5 x day for PIP - decrease pain /soreness     Person(s) Educated  Patient    Methods  Explanation;Demonstration;Handout    Comprehension  Verbalized understanding;Returned demonstration       OT Short Term Goals - 04/20/18 1635      OT SHORT TERM GOAL #1   Title  Pain on PRWHE improve with more than 20 points     Baseline  PRWHE at eval for pain 39/50 - and now 11/50    Status  Achieved      OT SHORT TERM GOAL #2   Title  Pt to be ind in HEP to decrease pain , increase ROM  an to show increase use of 2nd digit in L hand     Baseline  Pain at rest or use no pain  -only with PROM or stretching into flexion    Status  Achieved        OT Long Term Goals - 04/20/18 1636      OT LONG TERM GOAL #1   Title  Pt L 2nd digit AROM improve for pt to touch palm an able to grasp 2cm cylinder object without increase pain     Baseline  still decrease and not able to grasp 2 inch object - but progressing   MC flexion 70 this date and PIP 80 in session     Time  4    Period  Weeks    Status  On-going    Target Date  05/18/18      OT LONG TERM GOAL #2   Title  Pain in 2nd digit flexion improve to less than 2/10 with making fist    Baseline  pain 8/10 with use at eval -and  now no pain with use or AROM only with PROM increase to 5-8/10     Time  3    Period  Weeks    Status  On-going    Target Date  05/11/18      OT LONG TERM GOAL #3   Title  Function score on PRWHE improve with more than 15 points     Baseline  function score on PRWHE at eval 20/50-  and now 4.5/50    Status  Achieved            Plan - 05/12/18 1924    Clinical Impression Statement  Pt was able to maintain her AROM in 2nd digit from last time and 3 point /lat grip increase to WNL - but grip decrease because of  decrease flexion of 2nd digit and some soreness - pt to hold of onputty and  do her Stretch wtih AROM for PIP flexion 5 x day -but not over do and hold of on putty     Occupational performance deficits (Please refer to evaluation for details):  ADL's;IADL's;Play;Leisure    Rehab Potential  Fair    Clinical Decision Making  Several treatment options, min-mod task modification necessary    OT Frequency  1x / week    OT Duration  4 weeks    OT Treatment/Interventions  Self-care/ADL training;Iontophoresis;Therapeutic exercise;Patient/family education;Splinting;Paraffin;Fluidtherapy;Contrast Bath;Ultrasound;Manual Therapy;Passive range of motion    Plan  assess progress with composite flexion stretch at home with coban and putty     OT Home Exercise Plan  see pt instruction     Consulted and Agree with Plan of Care  Patient       Patient will benefit from skilled therapeutic intervention in order to improve the following deficits and impairments:     Visit Diagnosis: Pain in left finger(s)  Stiffness of left hand, not elsewhere classified  Localized edema  Muscle weakness (generalized)    Problem List Patient Active Problem List   Diagnosis Date Noted  . Hyperlipidemia due to type 2 diabetes mellitus ([place]) 11/25/2016  . Abnormal brain MRI 02/17/2016  . Nausea 05/02/2015  . GERD (gastroesophageal reflux disease) 04/24/2015  . Dizziness 04/24/2015  . Otitis externa 12/21/2014  . Allergic rhinitis 07/18/2014  . Colon polyp 07/18/2014  . Acid reflux 07/18/2014  . Hypercholesteremia 07/18/2014  . Low serum cobalamin 07/18/2014  . Adult BMI 30+ 07/18/2014  . Avitaminosis D 07/18/2014  . Diabetes mellitus, type 2 ([place]) 09/09/1998  . AG (atrophic gastritis) 03/06/1998  . HTN (hypertension) 06/04/1994    Rosalyn Gess OTR/L,CLT 05/12/2018, 7:29 PM  [facility] [address], [place], [address] Phone: [phone]   Fax:  [phone]  Name: Kathleen Campos MRN: [id number] Date of Birth: [birth year]

## 2018-05-24 ENCOUNTER — Ambulatory Visit: Payer: Medicare PPO | Admitting: Occupational Therapy

## 2018-07-11 ENCOUNTER — Other Ambulatory Visit: Payer: Self-pay | Admitting: *Deleted

## 2018-08-30 LAB — HM DIABETES EYE EXAM

## 2018-09-02 ENCOUNTER — Encounter: Payer: Self-pay | Admitting: *Deleted

## 2018-09-14 ENCOUNTER — Other Ambulatory Visit: Payer: Self-pay | Admitting: Physician Assistant

## 2018-09-14 DIAGNOSIS — I10 Essential (primary) hypertension: Secondary | ICD-10-CM

## 2018-11-23 NOTE — Progress Notes (Signed)
Subjective:   Kathleen Campos is a 78 y.o. female who presents for Medicare Annual (Subsequent) preventive examination.    This visit is being conducted through telemedicine due to the COVID-19 pandemic. This patient has given me verbal consent via doximity to conduct this visit, patient states they are participating from their home address. Some vital signs may be absent or patient reported.    Patient identification: identified by name, DOB, and current address  Review of Systems:  N/A  Cardiac Risk Factors include: advanced age (>46men, >67 women);diabetes mellitus;dyslipidemia;hypertension;obesity (BMI >30kg/m2)     Objective:     Vitals: There were no vitals taken for this visit.  There is no height or weight on file to calculate BMI. Unable to obtain vitals due to visit being conducted via telephonically.   Advanced Directives 11/24/2018 11/22/2017 11/05/2016 08/27/2016 01/20/2016 12/25/2015 05/31/2015  Does Patient Have a Medical Advance Directive? No Yes No No No No No  Type of Advance Directive - Living will;Healthcare Power of Attorney - - - - -  Copy of [place] in Chart? - No - copy requested - - - - -  Would patient like information on creating a medical advance directive? No - Patient declined - - (No Data) Yes - Scientist, clinical (histocompatibility and immunogenetics) given - -    Tobacco Social History   Tobacco Use  Smoking Status Former Smoker  . Quit date: 07/17/1984  . Years since quitting: 34.3  Smokeless Tobacco Never Used     Counseling given: Not Answered   Clinical Intake:  Pre-visit preparation completed: Yes  Pain : No/denies pain Pain Score: 0-No pain     Nutritional Risks: Nausea/ vomitting/ diarrhea(Nausea and abdominal pain the past 3 days. Declined scheduling an OV at this time.) Diabetes: Yes  How often do you need to have someone help you when you read instructions, pamphlets, or other written materials from your doctor or pharmacy?: 1 - Never   Diabetes:  Is the patient diabetic?  Yes type 2 If diabetic, was a CBG obtained today?  No  Did the patient bring in their glucometer from home?  No  How often do you monitor your CBG's? Once a day in the AM.   Financial Strains and Diabetes Management:  Are you having any financial strains with the device, your supplies or your medication? No .  Does the patient want to be seen by Chronic Care Management for management of their diabetes?  No  Would the patient like to be referred to a Nutritionist or for Diabetic Management?  No   Diabetic Exams:  Diabetic Eye Exam: Completed 08/30/18. Repeat yearly.  Diabetic Foot Exam: Completed 10/06/17. Pt has been advised about the importance in completing this exam. Note made to follow up on this at next in office visit.     Interpreter Needed?: No  Information entered by :: [facility], LPN  Past Medical History:  Diagnosis Date  . Diabetes mellitus without complication ([place])   . GERD (gastroesophageal reflux disease)   . Hyperlipidemia   . Hypertension   . Stroke [facility]) 2018   TIA   Past Surgical History:  Procedure Laterality Date  . BACK SURGERY  2003  . CERVICAL BIOPSY  W/ LOOP ELECTRODE EXCISION  2010  . CHOLECYSTECTOMY  1986  . COLONOSCOPY WITH PROPOFOL N/A 11/05/2016   Procedure: COLONOSCOPY WITH PROPOFOL;  Surgeon: Jonathon Bellows, MD;  Location: [facility] ENDOSCOPY;  Service: Gastroenterology;  Laterality: N/A;   Family History  Problem  Relation Age of Onset  . Hypertension Mother   . Heart disease Mother   . Lung cancer Father   . Heart disease Sister   . Hypertension Sister   . Hypertension Brother   . Diabetes Brother   . Hyperlipidemia Daughter   . Obesity Son   . Heart attack Son   . Hypertension Son   . Hyperlipidemia Son   . Heart disease Son    Social History   Socioeconomic History  . Marital status: Married    Spouse name: Kyung Rudd  . Number of children: 3  . Years of education: 8  . Highest education  level: High school graduate  Occupational History  . Occupation: retired  Scientific laboratory technician  . Financial resource strain: Not hard at all  . Food insecurity    Worry: Never true    Inability: Never true  . Transportation needs    Medical: No    Non-medical: No  Tobacco Use  . Smoking status: Former Smoker    Quit date: 07/17/1984    Years since quitting: 34.3  . Smokeless tobacco: Never Used  Substance and Sexual Activity  . Alcohol use: No  . Drug use: No  . Sexual activity: Not on file  Lifestyle  . Physical activity    Days per week: 0 days    Minutes per session: 0 min  . Stress: Not at all  Relationships  . Social Herbalist on phone: Patient refused    Gets together: Patient refused    Attends religious service: Patient refused    Active member of club or organization: Patient refused    Attends meetings of clubs or organizations: Patient refused    Relationship status: Patient refused  Other Topics Concern  . Not on file  Social History Narrative  . Not on file    Outpatient Encounter Medications as of 11/24/2018  Medication Sig  . aspirin 81 MG tablet Take 81 mg by mouth daily.   . Blood Glucose Monitoring Suppl (TRUE METRIX AIR GLUCOSE METER) DEVI 1 Device by Does not apply route daily. To check blood sugar once a day.  . calcium-vitamin D (OSCAL WITH D) 500-200 MG-UNIT per tablet Take 1 tablet by mouth 2 (two) times daily.   . Cholecalciferol 1000 UNITS capsule Take 1,000 Units by mouth daily.   . cyanocobalamin 1000 MCG tablet Take 2,000 mcg by mouth daily.   . diclofenac sodium (VOLTAREN) 1 % GEL Apply topically.  . fluticasone (FLONASE) 50 MCG/ACT nasal spray USE 2 SPRAYS IN EACH NOSTRIL EVERY DAY  . glipiZIDE (GLUCOTROL) 10 MG tablet Take 10 mg by mouth 2 (two) times daily.   Marland Kitchen glucose blood (TRUE METRIX BLOOD GLUCOSE TEST) test strip Use to check blood sugar twice daily per endocrine.  DX: E11.9.  Marland Kitchen hydrochlorothiazide (HYDRODIURIL) 12.5 MG tablet  Take 1 tablet (12.5 mg total) by mouth daily.  . INVOKANA 300 MG TABS tablet 300 mg daily before breakfast.   . omeprazole (PRILOSEC) 20 MG capsule Take 20 mg by mouth daily as needed.  . pioglitazone (ACTOS) 30 MG tablet Take 30 mg by mouth daily.   . potassium chloride SA (K-DUR) 20 MEQ tablet TAKE 1 TABLET EVERY DAY  . simvastatin (ZOCOR) 20 MG tablet TAKE 1 TABLET AT BEDTIME  . timolol (BETIMOL) 0.5 % ophthalmic solution Place 1 drop into both eyes daily.   . TRUEPLUS LANCETS 28G MISC To check blood sugar once a day.  . TRULICITY 1.5  MG/0.5ML SOPN Inject as directed once a week.   . valsartan (DIOVAN) 80 MG tablet TAKE 1 TABLET EVERY DAY  . timolol (TIMOPTIC) 0.5 % ophthalmic solution Place 1 drop into both eyes daily.    No facility-administered encounter medications on file as of 11/24/2018.     Activities of Daily Living In your present state of health, do you have any difficulty performing the following activities: 11/24/2018  Hearing? N  Vision? N  Difficulty concentrating or making decisions? N  Walking or climbing stairs? N  Dressing or bathing? N  Doing errands, shopping? N  Preparing Food and eating ? N  Using the Toilet? N  In the past six months, have you accidently leaked urine? N  Do you have problems with loss of bowel control? N  Managing your Medications? N  Managing your Finances? N  Housekeeping or managing your Housekeeping? N  Some recent data might be hidden    Patient Care Team: Rubye Beach as PCP - General (Family Medicine) Dingeldein, Remo Lipps, MD as Consulting Physician (Ophthalmology) Mickie Hillier, MD as Referring Physician (Psychiatry) Barnett Applebaum as Consulting Physician Lonia Farber, MD as Consulting Physician (Internal Medicine)    Assessment:   This is a routine wellness examination for [place].  Exercise Activities and Dietary recommendations Current Exercise Habits: The patient does not participate in regular  exercise at present, Exercise limited by: None identified  Goals    . DIET - REDUCE SUGAR INTAKE     Recommend to continue current diet plan of avoiding sweets, junk food and breads to help aid in weight loss and help diabetes.     . Exercise 3x per week (30 min per time)     Recommend increasing exercise. Pt to start walking 3 days a week for 20-30 minutes.        Fall Risk: Fall Risk  11/24/2018 11/22/2017 08/27/2016 09/19/2014 09/19/2014  Falls in the past year? 0 No No No No    FALL RISK PREVENTION PERTAINING TO THE HOME:  Any stairs in or around the home? No  If so, are there any without handrails? N/A  Home free of loose throw rugs in walkways, pet beds, electrical cords, etc? Yes  Adequate lighting in your home to reduce risk of falls? Yes   ASSISTIVE DEVICES UTILIZED TO PREVENT FALLS:  Life alert? No  Use of a cane, walker or w/c? Yes  Grab bars in the bathroom? No  Shower chair or bench in shower? No  Elevated toilet seat or a handicapped toilet? Yes   TIMED UP AND GO:  Was the test performed? No .    Depression Screen PHQ 2/9 Scores 11/24/2018 11/22/2017 08/27/2016 08/27/2016  PHQ - 2 Score 0 2 0 0  PHQ- 9 Score - 5 1 -     Cognitive Function     6CIT Screen 11/24/2018 11/22/2017 08/27/2016  What Year? 0 points 0 points 0 points  What month? 0 points 0 points 0 points  What time? 0 points 0 points 0 points  Count back from 20 0 points 0 points 0 points  Months in reverse 0 points 2 points 0 points  Repeat phrase 0 points 4 points 2 points  Total Score 0 6 2    Immunization History  Administered Date(s) Administered  . Influenza Split 02/11/2011, 12/11/2011  . Influenza, High Dose Seasonal PF 01/24/2014, 02/15/2015, 02/26/2016, 12/30/2016, 11/22/2017  . Influenza,inj,Quad PF,6+ Mos 01/16/2013  . Influenza-Unspecified 01/16/2013  . Pneumococcal Conjugate-13  05/17/2013  . Pneumococcal Polysaccharide-23 12/11/2011  . Zoster 10/10/2007    Qualifies for  Shingles Vaccine? Yes  Zostavax completed 10/10/07. Due for Shingrix. Education has been provided regarding the importance of this vaccine. Pt has been advised to call insurance company to determine out of pocket expense. Advised may also receive vaccine at local pharmacy or Health Dept. Verbalized acceptance and understanding.  Tdap: Although this vaccine is not a covered service during a Wellness Exam, does the patient still wish to receive this vaccine today?  No .   Flu Vaccine: Due for Flu vaccine. Does the patient want to receive this vaccine today?  No .   Pneumococcal Vaccine: Completed series  Screening Tests Health Maintenance  Topic Date Due  . FOOT EXAM  10/07/2018  . INFLUENZA VACCINE  10/08/2018  . TETANUS/TDAP  11/25/2018 (Originally 12/30/1959)  . HEMOGLOBIN A1C  01/06/2019  . OPHTHALMOLOGY EXAM  08/30/2019  . COLONOSCOPY  11/06/2019  . DEXA SCAN  Completed  . PNA vac Low Risk Adult  Completed    Cancer Screenings:  Colorectal Screening: Completed 11/05/16. Repeat every 3 years.  Mammogram: Completed 05/04/18. Marland Kitchen  Bone Density: Completed 07/25/12. Results reflect NORMAL. No repeat needed unless indicated by a physician.   Lung Cancer Screening: (Low Dose CT Chest recommended if Age 26-80 years, 30 pack-year currently smoking OR have quit w/in 15years.) does not qualify.   Additional Screening:  Dental Screening: Recommended annual dental exams for proper oral hygiene   Community Resource Referral:  CRR required this visit?  No       Plan:  I have personally reviewed and addressed the Medicare Annual Wellness questionnaire and have noted the following in the patient's chart:  A. Medical and social history B. Use of alcohol, tobacco or illicit drugs  C. Current medications and supplements D. Functional ability and status E.  Nutritional status F.  Physical activity G. Advance directives H. List of other physicians I.  Hospitalizations, surgeries, and ER  visits in previous 12 months J.  [place] such as hearing and vision if needed, cognitive and depression L. Referrals and appointments   In addition, I have reviewed and discussed with patient certain preventive protocols, quality metrics, and best practice recommendations. A written personalized care plan for preventive services as well as general preventive health recommendations were provided to patient.   [place]ora Score, [place]  [id number] Nurse Health Advisor   Nurse Notes: Pt to receive a influenza vaccine at next in office apt. Pt will have diabetic foot exam completed when she visits Dr Honor Junes next month.

## 2018-11-24 ENCOUNTER — Other Ambulatory Visit: Payer: Self-pay

## 2018-11-24 ENCOUNTER — Ambulatory Visit: Payer: Medicare PPO

## 2018-11-24 ENCOUNTER — Ambulatory Visit (INDEPENDENT_AMBULATORY_CARE_PROVIDER_SITE_OTHER): Payer: Medicare PPO

## 2018-11-24 DIAGNOSIS — Z0000 Encounter for general adult medical examination without abnormal findings: Secondary | ICD-10-CM

## 2018-11-24 NOTE — Patient Instructions (Signed)
Kathleen Campos , Thank you for taking time to come for your Medicare Wellness Visit. I appreciate your ongoing commitment to your health goals. Please review the following plan we discussed and let me know if I can assist you in the future.   Screening recommendations/referrals: Colonoscopy: Up to date, due 10/2019 Mammogram: Up to date, due 04/2020 Bone Density: Up to date. Previous DEXA was normal. No repeat needed unless advised by a physician.  Recommended yearly ophthalmology/optometry visit for glaucoma screening and checkup Recommended yearly dental visit for hygiene and checkup  Vaccinations: Influenza vaccine: Currently due Pneumococcal vaccine: Completed series Tdap vaccine: Pt declines today.  Shingles vaccine: Pt declines today.     Advanced directives: Advance directive discussed with you today. Even though you declined this today please call our office should you change your mind and we can give you the proper paperwork for you to fill out.  Conditions/risks identified: Recommend to continue to try an walk for 3 days a week for at least 30 minutes at a time.   Next appointment: 12/14/18 @ 3:00 PM with Fenton Malling.   Preventive Care 54 Years and Older, Female Preventive care refers to lifestyle choices and visits with your health care provider that can promote health and wellness. What does preventive care include?  A yearly physical exam. This is also called an annual well check.  Dental exams once or twice a year.  Routine eye exams. Ask your health care provider how often you should have your eyes checked.  Personal lifestyle choices, including:  Daily care of your teeth and gums.  Regular physical activity.  Eating a healthy diet.  Avoiding tobacco and drug use.  Limiting alcohol use.  Practicing safe sex.  Taking low-dose aspirin every day.  Taking vitamin and mineral supplements as recommended by your health care provider. What happens during an  annual well check? The services and screenings done by your health care provider during your annual well check will depend on your age, overall health, lifestyle risk factors, and family history of disease. Counseling  Your health care provider may ask you questions about your:  Alcohol use.  Tobacco use.  Drug use.  Emotional well-being.  Home and relationship well-being.  Sexual activity.  Eating habits.  History of falls.  Memory and ability to understand (cognition).  Work and work Statistician.  Reproductive health. Screening  You may have the following tests or measurements:  Height, weight, and BMI.  Blood pressure.  Lipid and cholesterol levels. These may be checked every 5 years, or more frequently if you are over 63 years old.  Skin check.  Lung cancer screening. You may have this screening every year starting at age 14 if you have a 30-pack-year history of smoking and currently smoke or have quit within the past 15 years.  Fecal occult blood test (FOBT) of the stool. You may have this test every year starting at age 71.  Flexible sigmoidoscopy or colonoscopy. You may have a sigmoidoscopy every 5 years or a colonoscopy every 10 years starting at age 94.  Hepatitis C blood test.  Hepatitis B blood test.  Sexually transmitted disease (STD) testing.  Diabetes screening. This is done by checking your blood sugar (glucose) after you have not eaten for a while (fasting). You may have this done every 1-3 years.  Bone density scan. This is done to screen for osteoporosis. You may have this done starting at age 41.  Mammogram. This may be done every 1-2  years. Talk to your health care provider about how often you should have regular mammograms. Talk with your health care provider about your test results, treatment options, and if necessary, the need for more tests. Vaccines  Your health care provider may recommend certain vaccines, such as:  Influenza  vaccine. This is recommended every year.  Tetanus, diphtheria, and acellular pertussis (Tdap, Td) vaccine. You may need a Td booster every 10 years.  Zoster vaccine. You may need this after age 77.  Pneumococcal 13-valent conjugate (PCV13) vaccine. One dose is recommended after age 10.  Pneumococcal polysaccharide (PPSV23) vaccine. One dose is recommended after age 39. Talk to your health care provider about which screenings and vaccines you need and how often you need them. This information is not intended to replace advice given to you by your health care provider. Make sure you discuss any questions you have with your health care provider. Document Released: 03/22/2015 Document Revised: 11/13/2015 Document Reviewed: 12/25/2014 Elsevier Interactive Patient Education  2017 [place] Prevention in the Home Falls can cause injuries. They can happen to people of all ages. There are many things you can do to make your home safe and to help prevent falls. What can I do on the outside of my home?  Regularly fix the edges of walkways and driveways and fix any cracks.  Remove anything that might make you trip as you walk through a door, such as a raised step or threshold.  Trim any bushes or trees on the path to your home.  Use bright outdoor lighting.  Clear any walking paths of anything that might make someone trip, such as rocks or tools.  Regularly check to see if handrails are loose or broken. Make sure that both sides of any steps have handrails.  Any raised decks and porches should have guardrails on the edges.  Have any leaves, snow, or ice cleared regularly.  Use sand or salt on walking paths during winter.  Clean up any spills in your garage right away. This includes oil or grease spills. What can I do in the bathroom?  Use night lights.  Install grab bars by the toilet and in the tub and shower. Do not use towel bars as grab bars.  Use non-skid mats or decals  in the tub or shower.  If you need to sit down in the shower, use a plastic, non-slip stool.  Keep the floor dry. Clean up any water that spills on the floor as soon as it happens.  Remove soap buildup in the tub or shower regularly.  Attach bath mats securely with double-sided non-slip rug tape.  Do not have throw rugs and other things on the floor that can make you trip. What can I do in the bedroom?  Use night lights.  Make sure that you have a light by your bed that is easy to reach.  Do not use any sheets or blankets that are too big for your bed. They should not hang down onto the floor.  Have a firm chair that has side arms. You can use this for support while you get dressed.  Do not have throw rugs and other things on the floor that can make you trip. What can I do in the kitchen?  Clean up any spills right away.  Avoid walking on wet floors.  Keep items that you use a lot in easy-to-reach places.  If you need to reach something above you, use a strong step  stool that has a grab bar.  Keep electrical cords out of the way.  Do not use floor polish or wax that makes floors slippery. If you must use wax, use non-skid floor wax.  Do not have throw rugs and other things on the floor that can make you trip. What can I do with my stairs?  Do not leave any items on the stairs.  Make sure that there are handrails on both sides of the stairs and use them. Fix handrails that are broken or loose. Make sure that handrails are as long as the stairways.  Check any carpeting to make sure that it is firmly attached to the stairs. Fix any carpet that is loose or worn.  Avoid having throw rugs at the top or bottom of the stairs. If you do have throw rugs, attach them to the floor with carpet tape.  Make sure that you have a light switch at the top of the stairs and the bottom of the stairs. If you do not have them, ask someone to add them for you. What else can I do to help  prevent falls?  Wear shoes that:  Do not have high heels.  Have rubber bottoms.  Are comfortable and fit you well.  Are closed at the toe. Do not wear sandals.  If you use a stepladder:  Make sure that it is fully opened. Do not climb a closed stepladder.  Make sure that both sides of the stepladder are locked into place.  Ask someone to hold it for you, if possible.  Clearly mark and make sure that you can see:  Any grab bars or handrails.  First and last steps.  Where the edge of each step is.  Use tools that help you move around (mobility aids) if they are needed. These include:  Canes.  Walkers.  Scooters.  Crutches.  Turn on the lights when you go into a dark area. Replace any light bulbs as soon as they burn out.  Set up your furniture so you have a clear path. Avoid moving your furniture around.  If any of your floors are uneven, fix them.  If there are any pets around you, be aware of where they are.  Review your medicines with your doctor. Some medicines can make you feel dizzy. This can increase your chance of falling. Ask your doctor what other things that you can do to help prevent falls. This information is not intended to replace advice given to you by your health care provider. Make sure you discuss any questions you have with your health care provider. Document Released: 12/20/2008 Document Revised: 08/01/2015 Document Reviewed: 03/30/2014 Elsevier Interactive Patient Education  2017 [employer].

## 2018-12-14 ENCOUNTER — Encounter: Payer: Medicare PPO | Admitting: Physician Assistant

## 2018-12-29 NOTE — Progress Notes (Signed)
[name], Female    DOB: 11-Jun-1940, 78 y.o.   MRN: [id number] Visit Date: 12/30/2018  Today's Provider: Mar Daring, PA-C   Chief Complaint  Patient presents with   Annual Exam   Subjective:     Patient had AWV with [facility] 11/24/2018   Complete Physical Kathleen Campos is a 78 y.o. female. She feels well. She reports exercising. She reports she is sleeping well. -----------------------------------------------------------   Review of Systems  Constitutional: Positive for fatigue.  HENT: Positive for dental problem.   Eyes: Positive for itching.  Respiratory: Negative.   Cardiovascular: Negative.   Gastrointestinal: Negative.   Endocrine: Negative.   Genitourinary: Negative.   Musculoskeletal: Positive for arthralgias and back pain.  Skin: Negative.   Allergic/Immunologic: Negative.   Neurological: Positive for light-headedness.  Hematological: Negative.   Psychiatric/Behavioral: Negative.     Social History   Socioeconomic History   Marital status: Married    Spouse name: Kyung Rudd   Number of children: 3   Years of education: 12   Highest education level: High school graduate  Occupational History   Occupation: retired  Scientist, product/process development strain: Not hard at all   Food insecurity    Worry: Never true    Inability: Never true   Transportation needs    Medical: No    Non-medical: No  Tobacco Use   Smoking status: Former Smoker    Quit date: 07/17/1984    Years since quitting: 34.4   Smokeless tobacco: Never Used  Substance and Sexual Activity   Alcohol use: No   Drug use: No   Sexual activity: Not on file  Lifestyle   Physical activity    Days per week: 0 days    Minutes per session: 0 min   Stress: Not at all  Relationships   Social connections    Talks on phone: Patient refused    Gets together: Patient refused    Attends religious service: Patient refused    Active member of  club or organization: Patient refused    Attends meetings of clubs or organizations: Patient refused    Relationship status: Patient refused   Intimate partner violence    Fear of current or ex partner: Patient refused    Emotionally abused: Patient refused    Physically abused: Patient refused    Forced sexual activity: Patient refused  Other Topics Concern   Not on file  Social History Narrative   Not on file    Past Medical History:  Diagnosis Date   Diabetes mellitus without complication ([place])    GERD (gastroesophageal reflux disease)    Hyperlipidemia    Hypertension    Stroke ([place]) 2018   TIA     Patient Active Problem List   Diagnosis Date Noted   Hyperlipidemia due to type 2 diabetes mellitus ([place]) 11/25/2016   Abnormal brain MRI 02/17/2016   Nausea 05/02/2015   GERD (gastroesophageal reflux disease) 04/24/2015   Dizziness 04/24/2015   Otitis externa 12/21/2014   Allergic rhinitis 07/18/2014   Colon polyp 07/18/2014   Acid reflux 07/18/2014   Hypercholesteremia 07/18/2014   Low serum cobalamin 07/18/2014   Adult BMI 30+ 07/18/2014   Avitaminosis D 07/18/2014   Diabetes mellitus, type 2 ([place]) 09/09/1998   AG (atrophic gastritis) 03/06/1998   HTN (hypertension) 06/04/1994    Past Surgical History:  Procedure Laterality Date   BACK SURGERY  2003   CERVICAL BIOPSY  W/ LOOP ELECTRODE EXCISION  2010   CHOLECYSTECTOMY  1986   COLONOSCOPY WITH PROPOFOL N/A 11/05/2016   Procedure: COLONOSCOPY WITH PROPOFOL;  Surgeon: Jonathon Bellows, MD;  [facility] ENDOSCOPY;  Service: Gastroenterology;  Laterality: N/A;    Her family history includes Diabetes in her brother; Heart attack in her son; Heart disease in her mother, sister, and son; Hyperlipidemia in her daughter and son; Hypertension in her brother, mother, sister, and son; Lung cancer in her father; Obesity in her son.   Current Outpatient Medications:    aspirin 81 MG tablet, Take 81  mg by mouth daily. , Disp: , Rfl:    Blood Glucose Monitoring Suppl (TRUE METRIX AIR GLUCOSE METER) DEVI, 1 Device by Does not apply route daily. To check blood sugar once a day., Disp: 1 Device, Rfl: 0   calcium-vitamin D (OSCAL WITH D) 500-200 MG-UNIT per tablet, Take 1 tablet by mouth 2 (two) times daily. , Disp: , Rfl:    Cholecalciferol 1000 UNITS capsule, Take 1,000 Units by mouth daily. , Disp: , Rfl:    cyanocobalamin 1000 MCG tablet, Take 2,000 mcg by mouth daily. , Disp: , Rfl:    diclofenac sodium (VOLTAREN) 1 % GEL, Apply topically., Disp: , Rfl:    fluticasone (FLONASE) 50 MCG/ACT nasal spray, USE 2 SPRAYS IN EACH NOSTRIL EVERY DAY, Disp: 48 g, Rfl: 3   glipiZIDE (GLUCOTROL) 10 MG tablet, Take 10 mg by mouth 2 (two) times daily. , Disp: , Rfl:    glucose blood (TRUE METRIX BLOOD GLUCOSE TEST) test strip, Use to check blood sugar twice daily per endocrine.  DX: E11.9., Disp: 200 each, Rfl: 1   hydrochlorothiazide (HYDRODIURIL) 12.5 MG tablet, Take 1 tablet (12.5 mg total) by mouth daily., Disp: 90 tablet, Rfl: 1   INVOKANA 300 MG TABS tablet, 300 mg daily before breakfast. , Disp: , Rfl:    omeprazole (PRILOSEC) 20 MG capsule, Take 20 mg by mouth daily as needed., Disp: , Rfl:    pioglitazone (ACTOS) 30 MG tablet, Take 30 mg by mouth daily. , Disp: , Rfl:    potassium chloride SA (K-DUR) 20 MEQ tablet, TAKE 1 TABLET EVERY DAY, Disp: 90 tablet, Rfl: 1   simvastatin (ZOCOR) 20 MG tablet, TAKE 1 TABLET AT BEDTIME, Disp: 90 tablet, Rfl: 1   timolol (BETIMOL) 0.5 % ophthalmic solution, Place 1 drop into both eyes daily. , Disp: , Rfl:    TRUEPLUS LANCETS 28G MISC, To check blood sugar once a day., Disp: 100 each, Rfl: 3   TRULICITY 1.5 0000000 SOPN, Inject as directed once a week. , Disp: , Rfl:    valsartan (DIOVAN) 80 MG tablet, TAKE 1 TABLET EVERY DAY, Disp: 90 tablet, Rfl: 1   timolol (TIMOPTIC) 0.5 % ophthalmic solution, Place 1 drop into both eyes daily. , Disp: ,  Rfl:   Patient Care Team: Mar Daring, PA-C as PCP - General (Family Medicine) Dingeldein, Remo Lipps, MD as Consulting Physician (Ophthalmology) Mickie Hillier, MD as Referring Physician (Psychiatry) Barnett Applebaum as Consulting Physician Lonia Farber, MD as Consulting Physician (Internal Medicine)     Objective:    Vitals: BP 131/78 (BP Location: Left Arm, Patient Position: Sitting, Cuff Size: Large)    Pulse 71    Temp 97.6 F (36.4 C) (Other (Comment))    Resp 16    Ht 5\' 7"  (1.702 m)    Wt 211 lb (95.7 kg)    BMI 33.05 kg/m   Physical Exam Vitals signs  reviewed.  Constitutional:      General: She is not in acute distress.    Appearance: Normal appearance. She is well-developed. She is obese. She is not ill-appearing or diaphoretic.  HENT:     Head: Normocephalic and atraumatic.     Right Ear: Tympanic membrane, ear canal and external ear normal.     Left Ear: Tympanic membrane, ear canal and external ear normal.     Nose: Nose normal.     Mouth/Throat:     Mouth: Mucous membranes are dry.     Pharynx: Oropharynx is clear. No oropharyngeal exudate.  Eyes:     General: No scleral icterus.       Right eye: No discharge.        Left eye: No discharge.     Extraocular Movements: Extraocular movements intact.     Conjunctiva/sclera: Conjunctivae normal.     Pupils: Pupils are equal, round, and reactive to light.  Neck:     Musculoskeletal: Normal range of motion and neck supple.     Thyroid: No thyromegaly.     Vascular: No JVD.     Trachea: No tracheal deviation.  Cardiovascular:     Rate and Rhythm: Normal rate and regular rhythm.     Pulses: Normal pulses.     Heart sounds: Normal heart sounds. No murmur. No friction rub. No gallop.   Pulmonary:     Effort: Pulmonary effort is normal. No respiratory distress.     Breath sounds: Normal breath sounds. No wheezing or rales.  Chest:     Chest wall: No tenderness.  Abdominal:     General: Abdomen is flat.  Bowel sounds are normal. There is no distension.     Palpations: Abdomen is soft. There is no mass.     Tenderness: There is no abdominal tenderness. There is no guarding or rebound.  Musculoskeletal: Normal range of motion.        General: No tenderness.     Right lower leg: No edema.     Left lower leg: No edema.  Lymphadenopathy:     Cervical: No cervical adenopathy.  Skin:    General: Skin is warm and dry.     Capillary Refill: Capillary refill takes less than 2 seconds.     Findings: No rash.  Neurological:     General: No focal deficit present.     Mental Status: She is alert and oriented to person, place, and time. Mental status is at baseline.  Psychiatric:        Mood and Affect: Mood normal.        Behavior: Behavior normal.        Thought Content: Thought content normal.        Judgment: Judgment normal.    Diabetic Foot Exam - Simple   Simple Foot Form Diabetic Foot exam was performed with the following findings: Yes 12/30/2018  2:12 PM  Visual Inspection No deformities, no ulcerations, no other skin breakdown bilaterally: Yes Sensation Testing Intact to touch and monofilament testing bilaterally: Yes Pulse Check Posterior Tibialis and Dorsalis pulse intact bilaterally: Yes Comments     Activities of Daily Living In your present state of health, do you have any difficulty performing the following activities: 11/24/2018  Hearing? N  Vision? N  Difficulty concentrating or making decisions? N  Walking or climbing stairs? N  Dressing or bathing? N  Doing errands, shopping? N  Preparing Food and eating ? N  Using the Toilet? N  In the past six months, have you accidently leaked urine? N  Do you have problems with loss of bowel control? N  Managing your Medications? N  Managing your Finances? N  Housekeeping or managing your Housekeeping? N  Some recent data might be hidden    Fall Risk Assessment Fall Risk  12/30/2018 11/24/2018 11/22/2017 08/27/2016  09/19/2014  Falls in the past year? 0 0 No No No  Number falls in past yr: 0 - - - -  Injury with Fall? 0 - - - -  Follow up Falls evaluation completed - - - -     Depression Screen PHQ 2/9 Scores 11/24/2018 11/22/2017 08/27/2016 08/27/2016  PHQ - 2 Score 0 2 0 0  PHQ- 9 Score - 5 1 -    6CIT Screen 11/24/2018  What Year? 0 points  What month? 0 points  What time? 0 points  Count back from 20 0 points  Months in reverse 0 points  Repeat phrase 0 points  Total Score 0       Assessment & Plan:    Annual Physical Reviewed patient's Family Medical History Reviewed and updated list of patient's medical providers Assessment of cognitive impairment was done Assessed patient's functional ability Established a written schedule for health screening [place] Completed and Reviewed  Exercise Activities and Dietary recommendations Goals     DIET - REDUCE SUGAR INTAKE     Recommend to continue current diet plan of avoiding sweets, junk food and breads to help aid in weight loss and help diabetes.      Exercise 3x per week (30 min per time)     Recommend increasing exercise. Pt to start walking 3 days a week for 20-30 minutes.        Immunization History  Administered Date(s) Administered   Influenza Split 02/11/2011, 12/11/2011   Influenza, High Dose Seasonal PF 01/24/2014, 02/15/2015, 02/26/2016, 12/30/2016, 11/22/2017   Influenza,inj,Quad PF,6+ Mos 01/16/2013   Influenza-Unspecified 01/16/2013   Pneumococcal Conjugate-13 05/17/2013   Pneumococcal Polysaccharide-23 12/11/2011   Zoster 10/10/2007    Health Maintenance  Topic Date Due   TETANUS/TDAP  12/30/1960   FOOT EXAM  10/07/2018   INFLUENZA VACCINE  10/08/2018   HEMOGLOBIN A1C  01/06/2019   OPHTHALMOLOGY EXAM  08/30/2019   COLONOSCOPY  11/06/2019   DEXA SCAN  Completed   PNA vac Low Risk Adult  Completed     Discussed health benefits of physical activity, and encouraged her  to engage in regular exercise appropriate for her age and condition.   1. Annual physical exam Normal physical exam today. Will check labs as below and f/u pending lab results. If labs are stable and WNL she will not need to have these rechecked for one year at her next annual physical exam. She is to call the office in the meantime if she has any acute issue, questions or concerns.  2. Type 2 diabetes mellitus with other circulatory  complication, without long-term current use of insulin ([place]) Followed by Endocrinology, Dr. Honor Junes.  - CBC with Differential/Platelet - Comprehensive metabolic panel - Hemoglobin A1c - Lipid panel - TSH  3. Essential hypertension Stable. Continue HCTZ 12.5mg , valsartan 80mg . Will check labs as below and f/u pending results. - CBC with Differential/Platelet - Comprehensive metabolic panel - Hemoglobin A1c - Lipid panel - TSH  4. Nausea Suspects it is secondary to all her medications she takes. Food does not make it better or worse. It has not made her vomit.  Denies bowel changes. No weight loss. Will give Zofran for prn use for nausea as below.  - ondansetron (ZOFRAN) 4 MG tablet; Take 1 tablet (4 mg total) by mouth every 8 (eight) hours as needed.  Dispense: 45 tablet; Refill: 1  5. Hyperlipidemia due to type 2 diabetes mellitus (HCC) Stable. Continue Simvastatin 20mg . Will check labs as below and f/u pending results. - Lipid panel  6. Need for immunization against influenza Flu vaccine given today without complication. Patient sat upright for 15 minutes to check for adverse reaction before being released. - Flu Vaccine QUAD High Dose(Fluad)  ------------------------------------------------------------------------------------------------------------    Mar Daring, PA-C  [facility]

## 2018-12-30 ENCOUNTER — Encounter: Payer: Self-pay | Admitting: Physician Assistant

## 2018-12-30 ENCOUNTER — Other Ambulatory Visit: Payer: Self-pay

## 2018-12-30 ENCOUNTER — Ambulatory Visit (INDEPENDENT_AMBULATORY_CARE_PROVIDER_SITE_OTHER): Payer: Medicare PPO | Admitting: Physician Assistant

## 2018-12-30 VITALS — BP 131/78 | HR 71 | Temp 97.6°F | Resp 16 | Ht 67.0 in | Wt 211.0 lb

## 2018-12-30 DIAGNOSIS — Z0000 Encounter for general adult medical examination without abnormal findings: Secondary | ICD-10-CM | POA: Diagnosis not present

## 2018-12-30 DIAGNOSIS — I10 Essential (primary) hypertension: Secondary | ICD-10-CM

## 2018-12-30 DIAGNOSIS — E1169 Type 2 diabetes mellitus with other specified complication: Secondary | ICD-10-CM

## 2018-12-30 DIAGNOSIS — E785 Hyperlipidemia, unspecified: Secondary | ICD-10-CM

## 2018-12-30 DIAGNOSIS — E1159 Type 2 diabetes mellitus with other circulatory complications: Secondary | ICD-10-CM

## 2018-12-30 DIAGNOSIS — R110 Nausea: Secondary | ICD-10-CM

## 2018-12-30 DIAGNOSIS — Z23 Encounter for immunization: Secondary | ICD-10-CM

## 2018-12-30 MED ORDER — ONDANSETRON HCL 4 MG PO TABS: 4.0000 mg | ORAL_TABLET | Freq: Three times a day (TID) | ORAL | 1 refills | Status: AC | PRN

## 2018-12-30 NOTE — Patient Instructions (Signed)
Health Maintenance After Age 77 After age 77, you are at a higher risk for certain long-term diseases and infections as well as injuries from falls. Falls are a major cause of broken bones and head injuries in people who are older than age 77. Getting regular preventive care can help to keep you healthy and well. Preventive care includes getting regular testing and making lifestyle changes as recommended by your health care provider. Talk with your health care provider about:  Which screenings and tests you should have. A screening is a test that checks for a disease when you have no symptoms.  A diet and exercise plan that is right for you. What should I know about screenings and tests to prevent falls? Screening and testing are the best ways to find a health problem early. Early diagnosis and treatment give you the best chance of managing medical conditions that are common after age 77. Certain conditions and lifestyle choices may make you more likely to have a fall. Your health care provider may recommend:  Regular vision checks. Poor vision and conditions such as cataracts can make you more likely to have a fall. If you wear glasses, make sure to get your prescription updated if your vision changes.  Medicine review. Work with your health care provider to regularly review all of the medicines you are taking, including over-the-counter medicines. Ask your health care provider about any side effects that may make you more likely to have a fall. Tell your health care provider if any medicines that you take make you feel dizzy or sleepy.  Osteoporosis screening. Osteoporosis is a condition that causes the bones to get weaker. This can make the bones weak and cause them to break more easily.  Blood pressure screening. Blood pressure changes and medicines to control blood pressure can make you feel dizzy.  Strength and balance checks. Your health care provider may recommend certain tests to check your  strength and balance while standing, walking, or changing positions.  Foot health exam. Foot pain and numbness, as well as not wearing proper footwear, can make you more likely to have a fall.  Depression screening. You may be more likely to have a fall if you have a fear of falling, feel emotionally low, or feel unable to do activities that you used to do.  Alcohol use screening. Using too much alcohol can affect your balance and may make you more likely to have a fall. What actions can I take to lower my risk of falls? General instructions  Talk with your health care provider about your risks for falling. Tell your health care provider if: ? You fall. Be sure to tell your health care provider about all falls, even ones that seem minor. ? You feel dizzy, sleepy, or off-balance.  Take over-the-counter and prescription medicines only as told by your health care provider. These include any supplements.  Eat a healthy diet and maintain a healthy weight. A healthy diet includes low-fat dairy products, low-fat (lean) meats, and fiber from whole grains, beans, and lots of fruits and vegetables. Home safety  Remove any tripping hazards, such as rugs, cords, and clutter.  Install safety equipment such as grab bars in bathrooms and safety rails on stairs.  Keep rooms and walkways well-lit. Activity   Follow a regular exercise program to stay fit. This will help you maintain your balance. Ask your health care provider what types of exercise are appropriate for you.  If you need a cane or   walker, use it as recommended by your health care provider.  Wear supportive shoes that have nonskid soles. Lifestyle  Do not drink alcohol if your health care provider tells you not to drink.  If you drink alcohol, limit how much you have: ? 0-1 drink a day for women. ? 0-2 drinks a day for men.  Be aware of how much alcohol is in your drink. In the [place] one drink equals one typical bottle of beer (12  oz), one-half glass of wine (5 oz), or one shot of hard liquor (1 oz).  Do not use any products that contain nicotine or tobacco, such as cigarettes and e-cigarettes. If you need help quitting, ask your health care provider. Summary  Having a healthy lifestyle and getting preventive care can help to protect your health and wellness after age 77.  Screening and testing are the best way to find a health problem early and help you avoid having a fall. Early diagnosis and treatment give you the best chance for managing medical conditions that are more common for people who are older than age 77.  Falls are a major cause of broken bones and head injuries in people who are older than age 77. Take precautions to prevent a fall at home.  Work with your health care provider to learn what changes you can make to improve your health and wellness and to prevent falls. This information is not intended to replace advice given to you by your health care provider. Make sure you discuss any questions you have with your health care provider. Document Released: 01/06/2017 Document Revised: 06/16/2018 Document Reviewed: 01/06/2017 Elsevier Patient Education  2020 [employer]  

## 2018-12-31 LAB — COMPREHENSIVE METABOLIC PANEL
ALT: 31 IU/L (ref 0–32)
AST: 37 IU/L (ref 0–40)
Albumin/Globulin Ratio: 1.5 (ref 1.2–2.2)
Albumin: 4.4 g/dL (ref 3.7–4.7)
Alkaline Phosphatase: 60 IU/L (ref 39–117)
BUN/Creatinine Ratio: 21 (ref 12–28)
BUN: 22 mg/dL (ref 8–27)
Bilirubin Total: 0.4 mg/dL (ref 0.0–1.2)
CO2: 24 mmol/L (ref 20–29)
Calcium: 9.8 mg/dL (ref 8.7–10.3)
Chloride: 98 mmol/L (ref 96–106)
Creatinine, Ser: 1.07 mg/dL — ABNORMAL HIGH (ref 0.57–1.00)
GFR calc Af Amer: 58 mL/min/{1.73_m2} — ABNORMAL LOW (ref >59)
GFR calc non Af Amer: 51 mL/min/{1.73_m2} — ABNORMAL LOW (ref >59)
Globulin, Total: 3 g/dL (ref 1.5–4.5)
Glucose: 237 mg/dL — ABNORMAL HIGH (ref 65–99)
Potassium: 4.2 mmol/L (ref 3.5–5.2)
Sodium: 136 mmol/L (ref 134–144)
Total Protein: 7.4 g/dL (ref 6.0–8.5)

## 2018-12-31 LAB — CBC WITH DIFFERENTIAL/PLATELET
Basophils Absolute: 0.1 10*3/uL (ref 0.0–0.2)
Basos: 1 %
EOS (ABSOLUTE): 0.1 10*3/uL (ref 0.0–0.4)
Eos: 1 %
Hematocrit: 43.6 % (ref 34.0–46.6)
Hemoglobin: 14.8 g/dL (ref 11.1–15.9)
Immature Grans (Abs): 0 10*3/uL (ref 0.0–0.1)
Immature Granulocytes: 1 %
Lymphocytes Absolute: 1.5 10*3/uL (ref 0.7–3.1)
Lymphs: 31 %
MCH: 29.8 pg (ref 26.6–33.0)
MCHC: 33.9 g/dL (ref 31.5–35.7)
MCV: 88 fL (ref 79–97)
Monocytes Absolute: 0.5 10*3/uL (ref 0.1–0.9)
Monocytes: 10 %
Neutrophils Absolute: 2.9 10*3/uL (ref 1.4–7.0)
Neutrophils: 56 %
Platelets: 149 10*3/uL — ABNORMAL LOW (ref 150–450)
RBC: 4.97 x10E6/uL (ref 3.77–5.28)
RDW: 12 % (ref 11.7–15.4)
WBC: 5 10*3/uL (ref 3.4–10.8)

## 2018-12-31 LAB — HEMOGLOBIN A1C
Est. average glucose Bld gHb Est-mCnc: 177 mg/dL
Hgb A1c MFr Bld: 7.8 % — ABNORMAL HIGH (ref 4.8–5.6)

## 2018-12-31 LAB — LIPID PANEL
Chol/HDL Ratio: 3.3 ratio (ref 0.0–4.4)
Cholesterol, Total: 214 mg/dL — ABNORMAL HIGH (ref 100–199)
HDL: 64 mg/dL (ref >39)
LDL Chol Calc (NIH): 124 mg/dL — ABNORMAL HIGH (ref 0–99)
Triglycerides: 146 mg/dL (ref 0–149)
VLDL Cholesterol Cal: 26 mg/dL (ref 5–40)

## 2018-12-31 LAB — TSH: TSH: 2.2 u[IU]/mL (ref 0.450–4.500)

## 2019-01-02 ENCOUNTER — Telehealth: Payer: Self-pay

## 2019-01-02 NOTE — Telephone Encounter (Signed)
Patient advised as below.  

## 2019-01-02 NOTE — Telephone Encounter (Signed)
-----   Message from Mar Daring, [place] sent at 01/02/2019  1:10 PM EDT ----- Blood count is normal. Kidney function decreased just slightly. This could be from fasting for the labs, but also important to keep tight control of your sugars as well. Liver enzymes are normal. Sodium, potassium and calcium are normal. A1c is stable at 7.8. Cholesterol up slightly compared to last year. Make sure to take your simvastatin daily and continue healthy lifestyle modifications. Thyroid is normal.

## 2019-02-10 ENCOUNTER — Ambulatory Visit (INDEPENDENT_AMBULATORY_CARE_PROVIDER_SITE_OTHER): Payer: Medicare PPO | Admitting: Physician Assistant

## 2019-02-10 DIAGNOSIS — K5900 Constipation, unspecified: Secondary | ICD-10-CM | POA: Diagnosis not present

## 2019-02-10 NOTE — Progress Notes (Signed)
[name] Female    DOB: 16-Jul-1940   78 y.o.   MRN: [id number] Visit Date: 02/10/2019  Today's Provider: Trinna Post, PA-C   Chief Complaint  Patient presents with  . Constipation   Subjective:    Virtual Visit via Telephone Note  I connected with Kathleen Campos on 02/10/19 at  3:00 PM EST by telephone and verified that I am speaking with the correct person using two identifiers.  Location: Patient: Home Provider: Office    I discussed the limitations, risks, security and privacy concerns of performing an evaluation and management service by telephone and the availability of in person appointments. I also discussed with the patient that there may be a patient responsible charge related to this service. The patient expressed understanding and agreed to proceed.  Constipation This is a recurrent problem. The current episode started 1 to 4 weeks ago. The problem has been gradually worsening since onset. Her stool frequency is 4 to 5 times per week. The stool is described as pellet like. The patient is not on a high fiber diet. She does not exercise regularly. There has been adequate water intake. Associated symptoms include abdominal pain, bloating and nausea. Pertinent negatives include no fecal incontinence. She has tried stool softeners for the symptoms. The treatment provided mild relief.   Patient reports severe constipation and impaction. Reports last week she was constipated and she drank a magnesium solution which produced a bowel movement. She had a partial bowel movement today but did not feel this emptied completely. Reports nausea without vomited. Prior to taking magnesium she took dulcolax tablets. Normally very regular with her bowel movements.   Colonoscopy 10/2016 with multiple polyps and recommended to repeat 2021.   Allergies  Allergen Reactions  . Biaxin [Clarithromycin] Nausea And Vomiting  . Codeine Nausea And Vomiting  . Lisinopril  Cough  . Metformin Nausea Only  . Morphine Nausea And Vomiting  . Ciprofloxacin Itching, Nausea Only and Rash     Current Outpatient Medications:  .  aspirin 81 MG tablet, Take 81 mg by mouth daily. , Disp: , Rfl:  .  Blood Glucose Monitoring Suppl (TRUE METRIX AIR GLUCOSE METER) DEVI, 1 Device by Does not apply route daily. To check blood sugar once a day., Disp: 1 Device, Rfl: 0 .  calcium-vitamin D (OSCAL WITH D) 500-200 MG-UNIT per tablet, Take 1 tablet by mouth 2 (two) times daily. , Disp: , Rfl:  .  Cholecalciferol 1000 UNITS capsule, Take 1,000 Units by mouth daily. , Disp: , Rfl:  .  cyanocobalamin 1000 MCG tablet, Take 2,000 mcg by mouth daily. , Disp: , Rfl:  .  diclofenac sodium (VOLTAREN) 1 % GEL, Apply topically., Disp: , Rfl:  .  fluticasone (FLONASE) 50 MCG/ACT nasal spray, USE 2 SPRAYS IN EACH NOSTRIL EVERY DAY, Disp: 48 g, Rfl: 3 .  glipiZIDE (GLUCOTROL) 10 MG tablet, Take 10 mg by mouth 2 (two) times daily. , Disp: , Rfl:  .  glucose blood (TRUE METRIX BLOOD GLUCOSE TEST) test strip, Use to check blood sugar twice daily per endocrine.  DX: E11.9., Disp: 200 each, Rfl: 1 .  hydrochlorothiazide (HYDRODIURIL) 12.5 MG tablet, Take 1 tablet (12.5 mg total) by mouth daily., Disp: 90 tablet, Rfl: 1 .  INVOKANA 300 MG TABS tablet, 300 mg daily before breakfast. , Disp: , Rfl:  .  omeprazole (PRILOSEC) 20 MG capsule, Take 20 mg by mouth daily as needed., Disp: ,  Rfl:  .  ondansetron (ZOFRAN) 4 MG tablet, Take 1 tablet (4 mg total) by mouth every 8 (eight) hours as needed., Disp: 45 tablet, Rfl: 1 .  pioglitazone (ACTOS) 30 MG tablet, Take 30 mg by mouth daily. , Disp: , Rfl:  .  potassium chloride SA (K-DUR) 20 MEQ tablet, TAKE 1 TABLET EVERY DAY, Disp: 90 tablet, Rfl: 1 .  simvastatin (ZOCOR) 20 MG tablet, TAKE 1 TABLET AT BEDTIME, Disp: 90 tablet, Rfl: 1 .  timolol (BETIMOL) 0.5 % ophthalmic solution, Place 1 drop into both eyes daily. , Disp: , Rfl:  .  timolol (TIMOPTIC) 0.5 %  ophthalmic solution, Place 1 drop into both eyes daily. , Disp: , Rfl:  .  TRUEPLUS LANCETS 28G MISC, To check blood sugar once a day., Disp: 100 each, Rfl: 3 .  TRULICITY 1.5 0000000 SOPN, Inject as directed once a week. , Disp: , Rfl:  .  valsartan (DIOVAN) 80 MG tablet, TAKE 1 TABLET EVERY DAY, Disp: 90 tablet, Rfl: 1  Review of Systems  Constitutional: Negative.   HENT: Negative.   Gastrointestinal: Positive for abdominal pain, bloating, constipation and nausea.  Genitourinary: Negative.   Musculoskeletal: Negative.     Social History   Tobacco Use  . Smoking status: Former Smoker    Quit date: 07/17/1984    Years since quitting: 34.5  . Smokeless tobacco: Never Used  Substance Use Topics  . Alcohol use: No      Objective:   There were no vitals taken for this visit. There were no vitals filed for this visit.There is no height or weight on file to calculate BMI.   Physical Exam   No results found for any visits on 02/10/19.     Assessment & Plan    1. Constipation, unspecified constipation type  Patient had some success with magnesium last week and I think she may try this again and follow it with a daily scoop of miralax to keep her more regular. If this issue persists I think she should consider returning to the GI specialist due to history of polyps.   I discussed the assessment and treatment plan with the patient. The patient was provided an opportunity to ask questions and all were answered. The patient agreed with the plan and demonstrated an understanding of the instructions.   The patient was advised to call back or seek an in-person evaluation if the symptoms worsen or if the condition fails to improve as anticipated.  I provided 15 minutes of non-face-to-face time during this encounter.     Trinna Post, PA-C  [facility]

## 2019-02-16 ENCOUNTER — Encounter: Payer: Self-pay | Admitting: Physician Assistant

## 2019-02-16 ENCOUNTER — Ambulatory Visit (INDEPENDENT_AMBULATORY_CARE_PROVIDER_SITE_OTHER): Payer: Medicare PPO | Admitting: Physician Assistant

## 2019-02-16 DIAGNOSIS — K5900 Constipation, unspecified: Secondary | ICD-10-CM | POA: Diagnosis not present

## 2019-02-16 DIAGNOSIS — R110 Nausea: Secondary | ICD-10-CM

## 2019-02-16 DIAGNOSIS — R1084 Generalized abdominal pain: Secondary | ICD-10-CM | POA: Diagnosis not present

## 2019-02-16 NOTE — Progress Notes (Signed)
[name] Female    DOB: 1940/05/06   78 y.o.   MRN: [id number] Visit Date: 02/16/2019  Today's Provider: Mar Daring, PA-C   Chief Complaint  Patient presents with  . Abdominal Pain   Subjective:    Virtual Visit via Telephone Note  I connected with Kathleen Campos on 02/16/19 at 11:20 AM EST by telephone and verified that I am speaking with the correct person using two identifiers.  Location: Patient: Home Provider: [facility]   I discussed the limitations, risks, security and privacy concerns of performing an evaluation and management service by telephone and the availability of in person appointments. I also discussed with the patient that there may be a patient responsible charge related to this service. The patient expressed understanding and agreed to proceed.  Abdominal Cramping This is a recurrent problem. The current episode started 1 to 4 weeks ago. The problem occurs constantly. The problem has been unchanged. The pain is at a severity of 0/10. The patient is experiencing no pain. The quality of the pain is cramping. Associated symptoms include constipation and nausea. Pertinent negatives include no diarrhea, fever or vomiting. Nothing aggravates the pain. The pain is relieved by bowel movements.   In October when patient was her for her CPE she had mentioned having nausea. It was felt at the time to be from her medications. Then on 02/10/19 she was seen via telephone visit by Carles Collet, PA-C for severe constipation. She had to use magnesium citrate twice in a weeks time to start having BM. She also started Miralax after the last mag citrate treatment (1 capful daily) to keep her BM soft and daily. She reports yesterday she did not take the Miralax and her BM was a little hard this morning. She denies any blood in the stool or dark tarry stools. Denies fevers, chills. Denies vomiting. Denies heartburn/indigestion. Has had cholecystectomy.   Since the constipation episode she has continued to have generalized discomfort in the abdomen. No pain. She reports it feels like her "stomach is just rolling." She has a decreased appetite. Mild constant nausea. No diarrhea. Symptoms are present constantly. Nothing makes it better or worse. No one area of tenderness. Patient reports some weight loss but states she has been trying to lose weight also. Reports good fluid intake. Forces herself to eat because she is diabetic. Had one episode yesterday where she felt lightheaded and had to sit down on her couch. Symptoms subsided and have not had any recurrence. Labs in October were unremarkable.   Allergies  Allergen Reactions  . Biaxin [Clarithromycin] Nausea And Vomiting  . Codeine Nausea And Vomiting  . Lisinopril Cough  . Metformin Nausea Only  . Morphine Nausea And Vomiting  . Ciprofloxacin Itching, Nausea Only and Rash     Current Outpatient Medications:  .  aspirin 81 MG tablet, Take 81 mg by mouth daily. , Disp: , Rfl:  .  Blood Glucose Monitoring Suppl (TRUE METRIX AIR GLUCOSE METER) DEVI, 1 Device by Does not apply route daily. To check blood sugar once a day., Disp: 1 Device, Rfl: 0 .  calcium-vitamin D (OSCAL WITH D) 500-200 MG-UNIT per tablet, Take 1 tablet by mouth 2 (two) times daily. , Disp: , Rfl:  .  Cholecalciferol 1000 UNITS capsule, Take 1,000 Units by mouth daily. , Disp: , Rfl:  .  cyanocobalamin 1000 MCG tablet, Take 2,000 mcg by mouth daily. , Disp: , Rfl:  .  fluticasone (FLONASE) 50 MCG/ACT nasal spray, USE 2 SPRAYS IN EACH NOSTRIL EVERY DAY, Disp: 48 g, Rfl: 3 .  glipiZIDE (GLUCOTROL) 10 MG tablet, Take 10 mg by mouth 2 (two) times daily. , Disp: , Rfl:  .  glucose blood (TRUE METRIX BLOOD GLUCOSE TEST) test strip, Use to check blood sugar twice daily per endocrine.  DX: E11.9., Disp: 200 each, Rfl: 1 .  hydrochlorothiazide (HYDRODIURIL) 12.5 MG tablet, Take 1 tablet (12.5 mg total) by mouth daily., Disp: 90 tablet,  Rfl: 1 .  INVOKANA 300 MG TABS tablet, 300 mg daily before breakfast. , Disp: , Rfl:  .  omeprazole (PRILOSEC) 20 MG capsule, Take 20 mg by mouth daily as needed., Disp: , Rfl:  .  ondansetron (ZOFRAN) 4 MG tablet, Take 1 tablet (4 mg total) by mouth every 8 (eight) hours as needed., Disp: 45 tablet, Rfl: 1 .  pioglitazone (ACTOS) 30 MG tablet, Take 30 mg by mouth daily. , Disp: , Rfl:  .  potassium chloride SA (K-DUR) 20 MEQ tablet, TAKE 1 TABLET EVERY DAY, Disp: 90 tablet, Rfl: 1 .  simvastatin (ZOCOR) 20 MG tablet, TAKE 1 TABLET AT BEDTIME, Disp: 90 tablet, Rfl: 1 .  timolol (BETIMOL) 0.5 % ophthalmic solution, Place 1 drop into both eyes daily. , Disp: , Rfl:  .  timolol (TIMOPTIC) 0.5 % ophthalmic solution, Place 1 drop into both eyes daily. , Disp: , Rfl:  .  TRUEPLUS LANCETS 28G MISC, To check blood sugar once a day., Disp: 100 each, Rfl: 3 .  TRULICITY 1.5 0000000 SOPN, Inject as directed once a week. , Disp: , Rfl:  .  valsartan (DIOVAN) 80 MG tablet, TAKE 1 TABLET EVERY DAY, Disp: 90 tablet, Rfl: 1  Review of Systems  Constitutional: Positive for appetite change. Negative for activity change, chills, fatigue, fever and unexpected weight change.  Respiratory: Negative.   Cardiovascular: Negative.   Gastrointestinal: Positive for abdominal distention, abdominal pain, constipation and nausea. Negative for anal bleeding, blood in stool, diarrhea, rectal pain and vomiting.  Musculoskeletal: Negative for back pain.  Neurological: Positive for light-headedness (once yesterday). Negative for dizziness, weakness and numbness.    Social History   Tobacco Use  . Smoking status: Former Smoker    Quit date: 07/17/1984    Years since quitting: 34.6  . Smokeless tobacco: Never Used  Substance Use Topics  . Alcohol use: No      Objective:   There were no vitals taken for this visit. There were no vitals filed for this visit.There is no height or weight on file to calculate BMI.    Physical Exam Vitals reviewed.  Constitutional:      General: She is not in acute distress. Pulmonary:     Effort: No respiratory distress.  Neurological:     Mental Status: She is alert.      No results found for any visits on 02/16/19.     Assessment & Plan     1. Nausea Unsure of cause and has most likely been worsening since October. Will get imaging as below. Continue symptomatic management with Zofran as needed. Continue using Miralax for constipation as needed. Continue to push fluids and stay well hydrated. Try to eat small, frequent meals for diabetes. I will f/u pending results of imaging. She was advised to call if symptoms worsen.  - CT Abdomen Pelvis W Contrast; Future  2. Generalized abdominal pain See above medical treatment plan.  3. Constipation, unspecified constipation type See above  medical treatment plan.  I discussed the assessment and treatment plan with the patient. The patient was provided an opportunity to ask questions and all were answered. The patient agreed with the plan and demonstrated an understanding of the instructions.   The patient was advised to call back or seek an in-person evaluation if the symptoms worsen or if the condition fails to improve as anticipated.  I provided 20 minutes of non-face-to-face time during this encounter.     Mar Daring, PA-C  [facility]

## 2019-02-16 NOTE — Patient Instructions (Signed)
Nausea, Adult Nausea is the feeling that you have an upset stomach or that you are about to vomit. Nausea on its own is not usually a serious concern, but it may be an early sign of a more serious medical problem. As nausea gets worse, it can lead to vomiting. If vomiting develops, or if you are not able to drink enough fluids, you are at risk of becoming dehydrated. Dehydration can make you tired and thirsty, cause you to have a dry mouth, and decrease how often you urinate. Older adults and people with other diseases or a weak disease-fighting system (immune system) are at higher risk for dehydration. The main goals of treating your nausea are:  To relieve your nausea.  To limit repeated nausea episodes.  To prevent vomiting and dehydration. Follow these instructions at home: Watch your symptoms for any changes. Tell your health care provider about them. Follow these instructions as told by your health care provider. Eating and drinking      Take an oral rehydration solution (ORS). This is a drink that is sold at pharmacies and retail stores.  Drink clear fluids slowly and in small amounts as you are able. Clear fluids include water, ice chips, low-calorie sports drinks, and fruit juice that has water added (diluted fruit juice).  Eat bland, easy-to-digest foods in small amounts as you are able. These foods include bananas, applesauce, rice, lean meats, toast, and crackers.  Avoid drinking fluids that contain a lot of sugar or caffeine, such as energy drinks, sports drinks, and soda.  Avoid alcohol.  Avoid spicy or fatty foods. General instructions  Take over-the-counter and prescription medicines only as told by your health care provider.  Rest at home while you recover.  Drink enough fluid to keep your urine pale yellow.  Breathe slowly and deeply when you feel nauseous.  Avoid smelling things that have strong odors.  Wash your hands often using soap and water. If soap and  water are not available, use hand sanitizer.  Make sure that all people in your household wash their hands well and often.  Keep all follow-up visits as told by your health care provider. This is important. Contact a health care provider if:  Your nausea gets worse.  Your nausea does not go away after two days.  You vomit.  You cannot drink fluids without vomiting.  You have any of the following: ? New symptoms. ? A fever. ? A headache. ? Muscle cramps. ? A rash. ? Pain while urinating.  You feel light-headed or dizzy. Get help right away if:  You have pain in your chest, neck, arm, or jaw.  You feel extremely weak or you faint.  You have vomit that is bright red or looks like coffee grounds.  You have bloody or black stools or stools that look like tar.  You have a severe headache, a stiff neck, or both.  You have severe pain, cramping, or bloating in your abdomen.  You have difficulty breathing or are breathing very quickly.  Your heart is beating very quickly.  Your skin feels cold and clammy.  You feel confused.  You have signs of dehydration, such as: ? Dark urine, very little urine, or no urine. ? Cracked lips. ? Dry mouth. ? Sunken eyes. ? Sleepiness. ? Weakness. These symptoms may represent a serious problem that is an emergency. Do not wait to see if the symptoms will go away. Get medical help right away. Call your local emergency services (911  in the [place] Do not drive yourself to the hospital. Summary  Nausea is the feeling that you have an upset stomach or that you are about to vomit. Nausea on its own is not usually a serious concern, but it may be an early sign of a more serious medical problem.  If vomiting develops, or if you are not able to drink enough fluids, you are at risk of becoming dehydrated.  Follow recommendations for eating and drinking and take over-the-counter and prescription medicines only as told by your health care  provider.  Contact a health care provider right away if your symptoms worsen or you have new symptoms.  Keep all follow-up visits as told by your health care provider. This is important. This information is not intended to replace advice given to you by your health care provider. Make sure you discuss any questions you have with your health care provider. Document Released: 04/02/2004 Document Revised: 08/03/2017 Document Reviewed: 08/03/2017 Elsevier Patient Education  2020 [employer] Inc.  

## 2019-02-27 ENCOUNTER — Telehealth: Payer: Self-pay

## 2019-02-27 DIAGNOSIS — R1084 Generalized abdominal pain: Secondary | ICD-10-CM

## 2019-02-27 NOTE — Telephone Encounter (Signed)
I can fill one out for her and she can pick up when she comes for labs.

## 2019-02-27 NOTE — Telephone Encounter (Signed)
Lab ordered. She can come at her convenience

## 2019-02-27 NOTE — Telephone Encounter (Signed)
Please review. KW Copied from [place] [id number]. Topic: General - Other >> Feb 27, 2019  3:23 PM Parke Poisson wrote: Reason for CRM: Per [facility] pt will need to have kidney function checked before she can do her CT.Please put in orders,Thanks

## 2019-02-27 NOTE — Telephone Encounter (Signed)
Copied from [place] [id number]. Topic: General - Other >> Feb 27, 2019 10:31 AM Celene Kras wrote: Reason for CRM: Pt called and is requesting to have information about her referral as she has been sick. Pt is also requesting to know if she can receive an application for a handicap sign. Please advise.

## 2019-02-27 NOTE — Telephone Encounter (Signed)
Patient advised as directed below. Lab requisition placed up front.

## 2019-03-06 ENCOUNTER — Ambulatory Visit: Payer: Medicare PPO | Attending: Internal Medicine

## 2019-03-06 DIAGNOSIS — Z20822 Contact with and (suspected) exposure to covid-19: Secondary | ICD-10-CM

## 2019-03-08 LAB — NOVEL CORONAVIRUS, NAA: SARS-CoV-2, NAA: NOT DETECTED

## 2019-03-09 LAB — RENAL FUNCTION PANEL
Albumin: 4.3 g/dL (ref 3.7–4.7)
BUN/Creatinine Ratio: 26 (ref 12–28)
BUN: 26 mg/dL (ref 8–27)
CO2: 22 mmol/L (ref 20–29)
Calcium: 9.6 mg/dL (ref 8.7–10.3)
Chloride: 101 mmol/L (ref 96–106)
Creatinine, Ser: 0.99 mg/dL (ref 0.57–1.00)
GFR calc Af Amer: 64 mL/min/{1.73_m2} (ref >59)
GFR calc non Af Amer: 55 mL/min/{1.73_m2} — ABNORMAL LOW (ref >59)
Glucose: 156 mg/dL — ABNORMAL HIGH (ref 65–99)
Phosphorus: 4.1 mg/dL (ref 3.0–4.3)
Potassium: 4.1 mmol/L (ref 3.5–5.2)
Sodium: 141 mmol/L (ref 134–144)

## 2019-03-13 ENCOUNTER — Ambulatory Visit
Admission: RE | Admit: 2019-03-13 | Discharge: 2019-03-13 | Disposition: A | Discharge status: Home / Self Care | Payer: Medicare PPO | Source: Ambulatory Visit | Attending: Physician Assistant | Admitting: Physician Assistant

## 2019-03-13 ENCOUNTER — Telehealth: Payer: Self-pay

## 2019-03-13 ENCOUNTER — Other Ambulatory Visit: Payer: Self-pay

## 2019-03-13 DIAGNOSIS — K7689 Other specified diseases of liver: Secondary | ICD-10-CM | POA: Diagnosis not present

## 2019-03-13 DIAGNOSIS — K5900 Constipation, unspecified: Secondary | ICD-10-CM | POA: Insufficient documentation

## 2019-03-13 DIAGNOSIS — K862 Cyst of pancreas: Secondary | ICD-10-CM | POA: Insufficient documentation

## 2019-03-13 DIAGNOSIS — R110 Nausea: Secondary | ICD-10-CM | POA: Insufficient documentation

## 2019-03-13 DIAGNOSIS — R109 Unspecified abdominal pain: Secondary | ICD-10-CM | POA: Diagnosis not present

## 2019-03-13 DIAGNOSIS — N2889 Other specified disorders of kidney and ureter: Secondary | ICD-10-CM | POA: Diagnosis not present

## 2019-03-13 MED ORDER — IOHEXOL 300 MG/ML  SOLN
100.0000 mL | Freq: Once | INTRAMUSCULAR | Status: AC | PRN
  Administered 2019-03-13: 11:00:00 100 mL via INTRAVENOUS

## 2019-03-13 NOTE — Telephone Encounter (Signed)
Written by Mar Daring, PA-C on 03/13/2019 1:11 PM EST Seen by patient Kathleen Campos on 03/13/2019 1:20 PM EST

## 2019-03-13 NOTE — Telephone Encounter (Signed)
-----   Message from Mar Daring, [place] sent at 03/13/2019  1:11 PM EST ----- Kidney function has improved and is doing well.

## 2019-03-14 ENCOUNTER — Other Ambulatory Visit: Payer: Self-pay | Admitting: Physician Assistant

## 2019-03-14 DIAGNOSIS — I10 Essential (primary) hypertension: Secondary | ICD-10-CM

## 2019-03-16 ENCOUNTER — Telehealth: Payer: Self-pay

## 2019-03-16 NOTE — Telephone Encounter (Signed)
Written by Mar Daring, PA-C on 03/14/2019 3:38 PM EST Seen by patient Kathleen Campos on 03/16/2019 12:50 AM EST

## 2019-03-16 NOTE — Telephone Encounter (Signed)
-----   Message from Mar Daring, [place] sent at 03/14/2019  3:38 PM EST ----- CT scan does not show any signs of why you are having this nausea and vomiting. There were two areas (one on the pancreas and one on the left kidney) that appear to be small cyst. These would most likely not cause your symptoms. It is recommended to get an abdominal MRI to further evaluate these if you desire. If you are still having the nausea I would recommend a GI referral.

## 2019-03-22 ENCOUNTER — Encounter: Payer: Self-pay | Admitting: Physician Assistant

## 2019-04-21 ENCOUNTER — Ambulatory Visit: Payer: Medicare PPO | Attending: Internal Medicine

## 2019-04-21 DIAGNOSIS — Z23 Encounter for immunization: Secondary | ICD-10-CM | POA: Insufficient documentation

## 2019-04-21 NOTE — Progress Notes (Signed)
   Covid-19 Vaccination Clinic  Name:  Kathleen Campos    MRN: [id number] DOB: [birth year]  04/21/2019  [name] was observed post Covid-19 immunization for 15 minutes without incidence. She was provided with Vaccine Information Sheet and instruction to access the V-Safe system.   [name] was instructed to call 911 with any severe reactions post vaccine: Marland Kitchen Difficulty breathing  . Swelling of your face and throat  . A fast heartbeat  . A bad rash all over your body  . Dizziness and weakness    Immunizations Administered    Name Date Dose VIS Date Route   Pfizer COVID-19 Vaccine 04/21/2019 11:44 AM 0.3 mL 02/17/2019 Intramuscular   Manufacturer: [place]   Lot: [id number]   [place]: [id number]

## 2019-05-04 ENCOUNTER — Other Ambulatory Visit: Payer: Self-pay | Admitting: Physician Assistant

## 2019-05-04 DIAGNOSIS — Z1231 Encounter for screening mammogram for malignant neoplasm of breast: Secondary | ICD-10-CM

## 2019-05-17 ENCOUNTER — Ambulatory Visit: Payer: Medicare PPO | Attending: Internal Medicine

## 2019-05-17 DIAGNOSIS — Z23 Encounter for immunization: Secondary | ICD-10-CM | POA: Insufficient documentation

## 2019-05-17 NOTE — Progress Notes (Signed)
   Covid-19 Vaccination Clinic  Name:  Kathleen Campos    MRN: [id number] DOB: 23-May-1940  05/17/2019  Kathleen Campos was observed post Covid-19 immunization for 15 minutes without incident. She was provided with Vaccine Information Sheet and instruction to access the V-Safe system.   Kathleen Campos was instructed to call 911 with any severe reactions post vaccine: Marland Kitchen Difficulty breathing  . Swelling of face and throat  . A fast heartbeat  . A bad rash all over body  . Dizziness and weakness   Immunizations Administered    Name Date Dose VIS Date Route   Pfizer COVID-19 Vaccine 05/17/2019 10:06 AM 0.3 mL 02/17/2019 Intramuscular   Manufacturer: [place]   Lot: [id number]   [place]: [id number]

## 2019-07-04 ENCOUNTER — Ambulatory Visit
Admission: RE | Admit: 2019-07-04 | Discharge: 2019-07-04 | Disposition: A | Discharge status: Home / Self Care | Payer: Medicare PPO | Source: Ambulatory Visit | Attending: Physician Assistant | Admitting: Physician Assistant

## 2019-07-04 DIAGNOSIS — Z1231 Encounter for screening mammogram for malignant neoplasm of breast: Secondary | ICD-10-CM | POA: Insufficient documentation

## 2019-07-05 ENCOUNTER — Telehealth: Payer: Self-pay

## 2019-07-05 NOTE — Telephone Encounter (Signed)
Pt advised.   Thanks,   -Kathleen Campos  

## 2019-07-05 NOTE — Telephone Encounter (Signed)
-----   Message from Mar Daring, PA-C sent at 07/05/2019  9:19 AM EDT ----- Normal mammogram. Repeat screening in one year.

## 2019-11-22 ENCOUNTER — Other Ambulatory Visit: Payer: Medicare PPO | Admitting: Gastroenterology

## 2020-08-27 ENCOUNTER — Other Ambulatory Visit: Payer: Self-pay | Admitting: Internal Medicine

## 2020-08-27 DIAGNOSIS — Z1231 Encounter for screening mammogram for malignant neoplasm of breast: Secondary | ICD-10-CM

## 2020-08-30 ENCOUNTER — Other Ambulatory Visit: Payer: Self-pay

## 2020-08-30 ENCOUNTER — Ambulatory Visit
Admission: RE | Admit: 2020-08-30 | Discharge: 2020-08-30 | Disposition: A | Discharge status: Home / Self Care | Payer: Medicare PPO | Source: Ambulatory Visit | Attending: Internal Medicine | Admitting: Internal Medicine

## 2020-08-30 DIAGNOSIS — Z1231 Encounter for screening mammogram for malignant neoplasm of breast: Secondary | ICD-10-CM

## 2021-07-17 ENCOUNTER — Other Ambulatory Visit: Payer: Self-pay | Admitting: Internal Medicine

## 2021-07-17 DIAGNOSIS — Z1231 Encounter for screening mammogram for malignant neoplasm of breast: Secondary | ICD-10-CM

## 2021-10-01 ENCOUNTER — Ambulatory Visit
Admission: RE | Admit: 2021-10-01 | Discharge: 2021-10-01 | Disposition: A | Discharge status: Home / Self Care | Payer: Medicare HMO | Source: Ambulatory Visit | Attending: Internal Medicine | Admitting: Internal Medicine

## 2021-10-01 DIAGNOSIS — Z1231 Encounter for screening mammogram for malignant neoplasm of breast: Secondary | ICD-10-CM | POA: Insufficient documentation

## 2022-09-23 ENCOUNTER — Other Ambulatory Visit: Payer: Self-pay | Admitting: Internal Medicine

## 2022-09-23 DIAGNOSIS — Z1231 Encounter for screening mammogram for malignant neoplasm of breast: Secondary | ICD-10-CM

## 2022-10-06 ENCOUNTER — Ambulatory Visit
Admission: RE | Admit: 2022-10-06 | Discharge: 2022-10-06 | Disposition: A | Discharge status: Home / Self Care | Payer: Medicare HMO | Source: Ambulatory Visit | Attending: Internal Medicine | Admitting: Internal Medicine

## 2022-10-06 DIAGNOSIS — Z1231 Encounter for screening mammogram for malignant neoplasm of breast: Secondary | ICD-10-CM | POA: Diagnosis present

## 2023-10-06 ENCOUNTER — Other Ambulatory Visit: Payer: Self-pay | Admitting: Internal Medicine

## 2023-10-06 DIAGNOSIS — Z1231 Encounter for screening mammogram for malignant neoplasm of breast: Secondary | ICD-10-CM

## 2023-10-25 ENCOUNTER — Ambulatory Visit
Admission: RE | Admit: 2023-10-25 | Discharge: 2023-10-25 | Disposition: A | Discharge status: Home / Self Care | Source: Ambulatory Visit | Attending: Internal Medicine | Admitting: Internal Medicine

## 2023-10-25 DIAGNOSIS — Z1231 Encounter for screening mammogram for malignant neoplasm of breast: Secondary | ICD-10-CM | POA: Diagnosis present
